# Patient Record
Sex: Male | Born: 1960 | Race: Black or African American | Hispanic: No | Marital: Single | Smoking: Current some day smoker
Health system: Southern US, Community
[De-identification: ages and names within clinical notes are randomized; demographics above are authoritative.]

## PROBLEM LIST (undated history)

## (undated) DIAGNOSIS — M779 Enthesopathy, unspecified: Secondary | ICD-10-CM

## (undated) DIAGNOSIS — K7460 Unspecified cirrhosis of liver: Secondary | ICD-10-CM

## (undated) DIAGNOSIS — B1920 Unspecified viral hepatitis C without hepatic coma: Secondary | ICD-10-CM

## (undated) DIAGNOSIS — F4310 Post-traumatic stress disorder, unspecified: Secondary | ICD-10-CM

## (undated) DIAGNOSIS — I10 Essential (primary) hypertension: Secondary | ICD-10-CM

## (undated) HISTORY — PX: HAND SURGERY: SHX662

---

## 1997-12-30 ENCOUNTER — Inpatient Hospital Stay (HOSPITAL_COMMUNITY): Admission: EM | Admit: 1997-12-30 | Discharge: 1997-12-31 | Payer: Self-pay

## 1999-01-20 ENCOUNTER — Emergency Department (HOSPITAL_COMMUNITY): Admission: EM | Admit: 1999-01-20 | Discharge: 1999-01-20 | Payer: Self-pay | Admitting: Emergency Medicine

## 1999-06-27 ENCOUNTER — Emergency Department (HOSPITAL_COMMUNITY): Admission: EM | Admit: 1999-06-27 | Discharge: 1999-06-27 | Payer: Self-pay | Admitting: Emergency Medicine

## 2001-12-17 ENCOUNTER — Emergency Department (HOSPITAL_COMMUNITY): Admission: EM | Admit: 2001-12-17 | Discharge: 2001-12-17 | Payer: Self-pay | Admitting: Emergency Medicine

## 2002-11-13 ENCOUNTER — Ambulatory Visit (HOSPITAL_COMMUNITY): Admission: RE | Admit: 2002-11-13 | Discharge: 2002-11-13 | Payer: Self-pay | Admitting: Gastroenterology

## 2003-08-22 ENCOUNTER — Inpatient Hospital Stay (HOSPITAL_COMMUNITY): Admission: AD | Admit: 2003-08-22 | Discharge: 2003-08-24 | Payer: Self-pay | Admitting: Emergency Medicine

## 2010-02-24 ENCOUNTER — Emergency Department (HOSPITAL_COMMUNITY): Admission: EM | Admit: 2010-02-24 | Discharge: 2010-02-24 | Payer: Self-pay | Admitting: Emergency Medicine
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
## 2010-09-29 NOTE — Op Note (Signed)
   NAME:  Angel Golden, Angel Golden                            ACCOUNT NO.:  [id number]   MEDICAL RECORD NO.:  [id number]                   PATIENT TYPE:  AMB   LOCATION:  ENDO                                 FACILITY:  [facility]   PHYSICIAN:  Anselmo Rod, M.D.               DATE OF BIRTH:  02-09-1961   DATE OF PROCEDURE:  11/13/2002  DATE OF DISCHARGE:                                 OPERATIVE REPORT   PROCEDURE PERFORMED:  Colonoscopy.   ENDOSCOPIST:  Anselmo Rod, M.D.   INSTRUMENT USED:  Olympus videocolonoscope.   INDICATION FOR THE PROCEDURE:  A 50 year old African-American male with  history of hepatitis B and C undergoing colonoscopy for history of GI bleed.  Rule out colonic polyps, masses, etc.   PREPROCEDURE PREPARATION:  Informed consent was procured from the patient.  The patient fasted for eight hours prior to the procedure and prepped with a  bottle of magnesium citrate and a gallon of GoLYTELY the night prior to the  procedure.   PREPROCEDURE PHYSICAL:  VITAL SIGNS:  Stable.  NECK:  Supple.  CHEST:  Clear to auscultation.  S1 and S2 regular.  ABDOMEN:  Soft with normal bowel sounds.   DESCRIPTION OF THE PROCEDURE:  The patient was placed in the left lateral  decubitus position and sedated with 80 mg of Demerol and  8 mg of Versed  intravenously.  Once the patient was adequately sedated and maintained on  low-flow oxygen and continuous cardiac monitoring, the Olympus  videocolonoscope was advanced from the rectum to the cecum without  difficulty.  No masses, polyps, erosions, ulcerations, or diverticula were  seen.  The appendiceal orifice and ileocecal valve were clearly visualized  and photographed.  Small internal hemorrhoids were seen on retroflexion in  the rectum.  The patient tolerated the procedure well without complications.   IMPRESSION:  Normal colonoscopy to the terminal ileum except for small  internal hemorrhoids.   RECOMMENDATIONS:  1. High fiber diet  with liberal fluid intake has been advised.  2.     Anusol HC 2.5% suppositories have been advised, one p.r. q.h.s.  These have      been called into his local pharmacy.  3. Outpatient followup in the next four weeks for further recommendations.                                               Anselmo Rod, M.D.    JNM/MEDQ  D:  11/13/2002  T:  11/14/2002  Job:  [id number]   cc:   Gabriel Earing, M.D.  [address]  [place]  [place] [address]  Fax: [phone]

## 2013-02-24 ENCOUNTER — Encounter (HOSPITAL_COMMUNITY): Payer: Self-pay | Admitting: Emergency Medicine

## 2013-02-24 ENCOUNTER — Emergency Department (HOSPITAL_COMMUNITY)
Admission: EM | Admit: 2013-02-24 | Discharge: 2013-02-26 | Disposition: A | Discharge status: Other Acute Inpatient Hospital | Payer: Self-pay | Attending: Emergency Medicine | Admitting: Emergency Medicine

## 2013-02-24 DIAGNOSIS — Z5900 Homelessness unspecified: Secondary | ICD-10-CM | POA: Insufficient documentation

## 2013-02-24 DIAGNOSIS — R45850 Homicidal ideations: Secondary | ICD-10-CM | POA: Insufficient documentation

## 2013-02-24 DIAGNOSIS — F17200 Nicotine dependence, unspecified, uncomplicated: Secondary | ICD-10-CM | POA: Insufficient documentation

## 2013-02-24 DIAGNOSIS — R45851 Suicidal ideations: Secondary | ICD-10-CM | POA: Insufficient documentation

## 2013-02-24 DIAGNOSIS — F1010 Alcohol abuse, uncomplicated: Secondary | ICD-10-CM | POA: Insufficient documentation

## 2013-02-24 DIAGNOSIS — Z8719 Personal history of other diseases of the digestive system: Secondary | ICD-10-CM | POA: Insufficient documentation

## 2013-02-24 DIAGNOSIS — F1410 Cocaine abuse, uncomplicated: Secondary | ICD-10-CM | POA: Insufficient documentation

## 2013-02-24 DIAGNOSIS — I10 Essential (primary) hypertension: Secondary | ICD-10-CM | POA: Insufficient documentation

## 2013-02-24 DIAGNOSIS — F1910 Other psychoactive substance abuse, uncomplicated: Secondary | ICD-10-CM

## 2013-02-24 DIAGNOSIS — F1210 Cannabis abuse, uncomplicated: Secondary | ICD-10-CM | POA: Insufficient documentation

## 2013-02-24 DIAGNOSIS — Z8619 Personal history of other infectious and parasitic diseases: Secondary | ICD-10-CM | POA: Insufficient documentation

## 2013-02-24 HISTORY — DX: Unspecified viral hepatitis C without hepatic coma: B19.20

## 2013-02-24 HISTORY — DX: Unspecified cirrhosis of liver: K74.60

## 2013-02-24 LAB — CBC
HCT: 42.1 % (ref 39.0–52.0)
Hemoglobin: 14.4 g/dL (ref 13.0–17.0)
Platelets: 128 10*3/uL — ABNORMAL LOW (ref 150–400)
RBC: 4.79 MIL/uL (ref 4.22–5.81)
RDW: 14.4 % (ref 11.5–15.5)
WBC: 5.1 10*3/uL (ref 4.0–10.5)

## 2013-02-24 LAB — RAPID URINE DRUG SCREEN, HOSP PERFORMED
Barbiturates: NOT DETECTED
Benzodiazepines: NOT DETECTED
Opiates: NOT DETECTED
Tetrahydrocannabinol: POSITIVE — AB

## 2013-02-24 LAB — COMPREHENSIVE METABOLIC PANEL
AST: 327 U/L — ABNORMAL HIGH (ref 0–37)
Albumin: 4 g/dL (ref 3.5–5.2)
Alkaline Phosphatase: 116 U/L (ref 39–117)
BUN: 6 mg/dL (ref 6–23)
CO2: 28 mEq/L (ref 19–32)
GFR calc Af Amer: >90 mL/min (ref >90)
Glucose, Bld: 72 mg/dL (ref 70–99)
Sodium: 136 mEq/L (ref 135–145)

## 2013-02-24 LAB — SALICYLATE LEVEL: Salicylate Lvl: <2 mg/dL — ABNORMAL LOW (ref 2.8–20.0)

## 2013-02-24 MED ORDER — NICOTINE 21 MG/24HR TD PT24
21.0000 mg | MEDICATED_PATCH | Freq: Every day | TRANSDERMAL | Status: DC
  Administered 2013-02-24 – 2013-02-26 (×3): 21 mg via TRANSDERMAL
  Filled 2013-02-24 (×3): qty 1

## 2013-02-24 MED ORDER — ACETAMINOPHEN 325 MG PO TABS
650.0000 mg | ORAL_TABLET | ORAL | Status: DC | PRN
  Administered 2013-02-25 (×2): 650 mg via ORAL
  Filled 2013-02-24 (×2): qty 2

## 2013-02-24 MED ORDER — LORAZEPAM 1 MG PO TABS
0.0000 mg | ORAL_TABLET | Freq: Four times a day (QID) | ORAL | Status: AC
  Administered 2013-02-26: 1 mg via ORAL
  Filled 2013-02-24 (×2): qty 1

## 2013-02-24 MED ORDER — ADULT MULTIVITAMIN W/MINERALS CH
1.0000 | ORAL_TABLET | Freq: Every day | ORAL | Status: DC
  Administered 2013-02-24 – 2013-02-26 (×3): 1 via ORAL
  Filled 2013-02-24 (×3): qty 1

## 2013-02-24 MED ORDER — LORAZEPAM 1 MG PO TABS: 0.0000 mg | ORAL_TABLET | Freq: Two times a day (BID) | ORAL | Status: DC

## 2013-02-24 MED ORDER — ZOLPIDEM TARTRATE 5 MG PO TABS: 5.0000 mg | ORAL_TABLET | Freq: Every evening | ORAL | Status: DC | PRN

## 2013-02-24 MED ORDER — IBUPROFEN 400 MG PO TABS
600.0000 mg | ORAL_TABLET | Freq: Three times a day (TID) | ORAL | Status: DC | PRN
  Administered 2013-02-24 – 2013-02-26 (×2): 600 mg via ORAL
  Filled 2013-02-24 (×4): qty 1

## 2013-02-24 MED ORDER — ONDANSETRON HCL 4 MG PO TABS
4.0000 mg | ORAL_TABLET | Freq: Three times a day (TID) | ORAL | Status: DC | PRN
Start: 2013-02-24 — End: 2013-02-26

## 2013-02-24 MED ORDER — ALUM & MAG HYDROXIDE-SIMETH 200-200-20 MG/5ML PO SUSP: 30.0000 mL | ORAL | Status: DC | PRN

## 2013-02-24 MED ORDER — LORAZEPAM 2 MG/ML IJ SOLN: 0.0000 mg | Freq: Four times a day (QID) | INTRAMUSCULAR | Status: DC

## 2013-02-24 MED ORDER — FOLIC ACID 1 MG PO TABS
1.0000 mg | ORAL_TABLET | Freq: Every day | ORAL | Status: DC
  Administered 2013-02-24 – 2013-02-26 (×3): 1 mg via ORAL
  Filled 2013-02-24 (×3): qty 1

## 2013-02-24 MED ORDER — LORAZEPAM 1 MG PO TABS
1.0000 mg | ORAL_TABLET | Freq: Three times a day (TID) | ORAL | Status: DC | PRN
  Administered 2013-02-25: 1 mg via ORAL

## 2013-02-24 MED ORDER — THIAMINE HCL 100 MG/ML IJ SOLN: 100.0000 mg | Freq: Every day | INTRAMUSCULAR | Status: DC

## 2013-02-24 MED ORDER — LORAZEPAM 2 MG/ML IJ SOLN: 0.0000 mg | Freq: Two times a day (BID) | INTRAMUSCULAR | Status: DC

## 2013-02-24 MED ORDER — VITAMIN B-1 100 MG PO TABS
100.0000 mg | ORAL_TABLET | Freq: Every day | ORAL | Status: DC
  Administered 2013-02-24 – 2013-02-26 (×3): 100 mg via ORAL
  Filled 2013-02-24 (×3): qty 1

## 2013-02-24 NOTE — ED Notes (Signed)
Telepsych placed at bedside. Spoke with psychiatrist on phone. Psychiatrist to call now.

## 2013-02-24 NOTE — ED Notes (Signed)
Pt reporting SI and HI now due to relationship and family stress. Reports no family support. Reports plan to run truck off bridge to kill himself. Denies hearing voices or hallucinations. Reports I am tired. Pt is sitting calmly in chair and is cooperative. Reports alcohol, crack and weed use. States he is homeless and has been since May. C/o headache which he keeps all the time.

## 2013-02-24 NOTE — ED Notes (Signed)
Confirmed with security, pt has been wanded.

## 2013-02-24 NOTE — Progress Notes (Signed)
TTS ran pt by Angel Golden and she agreed pt need inpatient treatment.  Attempts will be made to get pt accepted to [place] since pt has already started this process and prefers to go there.  Pt would be appropriate for a 500 or 300 hall at [facility] - but no beds at this time.

## 2013-02-24 NOTE — ED Notes (Signed)
Called AC for sitter 
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
## 2013-02-24 NOTE — ED Provider Notes (Signed)
CSN: [id number]     Arrival date & time 02/24/13  1334 History   First MD Initiated Contact with Patient 02/24/13 1538     Chief Complaint  Patient presents with  . Suicidal   (Consider location/radiation/quality/duration/timing/severity/associated sxs/prior Treatment) HPI Comments: Patient is a 52 year old male with history of cirrhosis and hepatitis C who presents today with dysphoric mood, worsening suicidal and homicidal thoughts. He reports these have been worsening since Easter when his girlfriend broke up with him. Since that time he has been drinking as much beer as he can, smoking crack cocaine, and using marijuana. He reports he drinks approximately 6 40 ounces of beer daily. He has history of DTs after detoxing while in prison. His last drink was around 230 am. He denies visual or auditory hallucinations. He has no physical complaints.   The history is provided by the patient. No language interpreter was used.    Past Medical History  Diagnosis Date  . Cirrhosis   . Hepatitis C    History reviewed. No pertinent past surgical history. No family history on file. History  Substance Use Topics  . Smoking status: Current Every Day Smoker  . Smokeless tobacco: Not on file  . Alcohol Use: Yes    Review of Systems  Constitutional: Negative for fever and chills.  Respiratory: Negative for shortness of breath.   Cardiovascular: Negative for chest pain.  Gastrointestinal: Negative for nausea, vomiting and abdominal pain.  Psychiatric/Behavioral: Positive for suicidal ideas and dysphoric mood. Negative for hallucinations.  All other systems reviewed and are negative.    Allergies  Review of patient's allergies indicates no known allergies.  Home Medications  No current outpatient prescriptions on file. BP 147/102  Pulse 89  Temp(Src) 99 F (37.2 C) (Oral)  Resp 16  SpO2 97% Physical Exam  Nursing note and vitals reviewed. Constitutional: He is oriented to person,  place, and time. He appears well-developed and well-nourished. No distress.  HENT:  Head: Normocephalic and atraumatic.  Right Ear: External ear normal.  Left Ear: External ear normal.  Nose: Nose normal.  Eyes: Conjunctivae are normal.  Neck: Normal range of motion. No tracheal deviation present.  Cardiovascular: Normal rate, regular rhythm and normal heart sounds.   Pulmonary/Chest: Effort normal and breath sounds normal. No stridor.  Abdominal: Soft. He exhibits no distension. There is no tenderness.  Musculoskeletal: Normal range of motion.  Neurological: He is alert and oriented to person, place, and time.  Skin: Skin is warm and dry. He is not diaphoretic.  Psychiatric: He has a normal mood and affect. His behavior is normal. He expresses homicidal and suicidal ideation. He expresses no suicidal plans and no homicidal plans.    ED Course  Procedures (including critical care time) Labs Review Labs Reviewed  CBC - Abnormal; Notable for the following:    Platelets 128 (*)    All other components within normal limits  COMPREHENSIVE METABOLIC PANEL - Abnormal; Notable for the following:    Total Protein 8.9 (*)    AST 327 (*)    ALT 187 (*)    Total Bilirubin 1.8 (*)    All other components within normal limits  SALICYLATE LEVEL - Abnormal; Notable for the following:    Salicylate Lvl <2.0 (*)    All other components within normal limits  URINE RAPID DRUG SCREEN (HOSP PERFORMED) - Abnormal; Notable for the following:    Cocaine POSITIVE (*)    Tetrahydrocannabinol POSITIVE (*)    All  other components within normal limits  ACETAMINOPHEN LEVEL  ETHANOL   Imaging Review No results found.  EKG Interpretation   None       MDM   1. Suicidal ideation   2. Homicidal ideation   3. Alcohol abuse   4. Substance abuse    Patient presents today with SI/HI and depression. Hx of substance and alcohol abuse. Hx of hep c and cirrhosis. Patient to be seen by ACT team. LFTs  elevated. I suspect this is chronic. Medically clear. Placed on CIWA protocol. Pt is cooperative.     Mora Bellman, PA-C 02/24/13 1754

## 2013-02-25 ENCOUNTER — Encounter (HOSPITAL_COMMUNITY): Payer: Self-pay | Admitting: Emergency Medicine

## 2013-02-25 NOTE — ED Notes (Signed)
When asked pt if he is homeless -- pt answered "not really- I have been staying with friends" but admits to not having a permanent residence. Pt given information regarding [facility] in [place] for homeless veterans.

## 2013-02-25 NOTE — ED Notes (Addendum)
Spoke to April RN, Patient transfer coordinator [phone] sts has taken place of Andi Hence. sts deals with all transfers and has put patient on the waiting list but unable to give timeframe. Sts does not know who "Rice" is but she is the contact for all patients from this system to get placement in the [place].  Pt updated on plan of care. Attempted to reach pt. [facility] case manager and TTS with no success.

## 2013-02-25 NOTE — ED Notes (Addendum)
Pt spoke on phone to [employer] from [place].  Pt. Updated on plan of care.

## 2013-02-25 NOTE — Progress Notes (Signed)
Underwriter called [facility] [place] who has beds.  Spoke with Rice who confirmed receiving faxed referral for pt.  Blain Pais, MHT/NS

## 2013-02-25 NOTE — ED Notes (Signed)
Pt reports pain in his head, but does not want any medication for the pain.

## 2013-02-25 NOTE — BH Assessment (Signed)
Patient's case manager with the [facility] is Madelaine Bhat Roche [phone].

## 2013-02-25 NOTE — ED Notes (Addendum)
Rn called and spoke to [place] with tts and received update. RN to call   [facility]i [facility] with [facility] to aid in transfer   [phone] ext. [phone].  (fax) [phone]

## 2013-02-25 NOTE — ED Notes (Signed)
Spoke with Angel Golden- TTS and updated on [place] situation.

## 2013-02-25 NOTE — ED Provider Notes (Signed)
Medical screening examination/treatment/procedure(s) were performed by non-physician practitioner and as supervising physician I was immediately available for consultation/collaboration.   Shanette Tamargo M Xayne Brumbaugh, MD 02/25/13 0053 

## 2013-02-26 ENCOUNTER — Inpatient Hospital Stay (HOSPITAL_COMMUNITY)
Admission: AD | Admit: 2013-02-26 | Discharge: 2013-03-05 | DRG: 897 | Disposition: A | Discharge status: Home / Self Care | Payer: Federal, State, Local not specified - Other | Source: Intra-hospital | Attending: Psychiatry | Admitting: Psychiatry

## 2013-02-26 ENCOUNTER — Inpatient Hospital Stay (HOSPITAL_COMMUNITY): Admission: AD | Admit: 2013-02-26 | Payer: Self-pay | Source: Intra-hospital | Admitting: Psychiatry

## 2013-02-26 ENCOUNTER — Encounter (HOSPITAL_COMMUNITY): Payer: Self-pay

## 2013-02-26 DIAGNOSIS — B1920 Unspecified viral hepatitis C without hepatic coma: Secondary | ICD-10-CM | POA: Diagnosis present

## 2013-02-26 DIAGNOSIS — Z79899 Other long term (current) drug therapy: Secondary | ICD-10-CM

## 2013-02-26 DIAGNOSIS — F4310 Post-traumatic stress disorder, unspecified: Secondary | ICD-10-CM | POA: Diagnosis present

## 2013-02-26 DIAGNOSIS — K7460 Unspecified cirrhosis of liver: Secondary | ICD-10-CM | POA: Diagnosis present

## 2013-02-26 DIAGNOSIS — F39 Unspecified mood [affective] disorder: Secondary | ICD-10-CM | POA: Diagnosis present

## 2013-02-26 DIAGNOSIS — F1920 Other psychoactive substance dependence, uncomplicated: Principal | ICD-10-CM | POA: Diagnosis present

## 2013-02-26 DIAGNOSIS — I10 Essential (primary) hypertension: Secondary | ICD-10-CM | POA: Diagnosis present

## 2013-02-26 HISTORY — DX: Essential (primary) hypertension: I10

## 2013-02-26 MED ORDER — MAGNESIUM HYDROXIDE 400 MG/5ML PO SUSP: 30.0000 mL | Freq: Every day | ORAL | Status: DC | PRN

## 2013-02-26 MED ORDER — CHLORDIAZEPOXIDE HCL 25 MG PO CAPS
25.0000 mg | ORAL_CAPSULE | ORAL | Status: AC
  Administered 2013-03-02 (×2): 25 mg via ORAL
  Filled 2013-02-26 (×2): qty 1

## 2013-02-26 MED ORDER — ALUM & MAG HYDROXIDE-SIMETH 200-200-20 MG/5ML PO SUSP
30.0000 mL | ORAL | Status: DC | PRN
  Administered 2013-02-28: 30 mL via ORAL

## 2013-02-26 MED ORDER — ONDANSETRON 4 MG PO TBDP: 4.0000 mg | ORAL_TABLET | Freq: Four times a day (QID) | ORAL | Status: AC | PRN

## 2013-02-26 MED ORDER — CHLORDIAZEPOXIDE HCL 25 MG PO CAPS
25.0000 mg | ORAL_CAPSULE | Freq: Three times a day (TID) | ORAL | Status: AC
  Administered 2013-03-01 (×3): 25 mg via ORAL
  Filled 2013-02-26 (×3): qty 1

## 2013-02-26 MED ORDER — HYDROXYZINE HCL 25 MG PO TABS
25.0000 mg | ORAL_TABLET | Freq: Four times a day (QID) | ORAL | Status: AC | PRN
  Administered 2013-02-26: 25 mg via ORAL
  Filled 2013-02-26: qty 1

## 2013-02-26 MED ORDER — CHLORDIAZEPOXIDE HCL 25 MG PO CAPS
25.0000 mg | ORAL_CAPSULE | Freq: Every day | ORAL | Status: AC
  Administered 2013-03-03: 25 mg via ORAL
  Filled 2013-02-26: qty 1

## 2013-02-26 MED ORDER — LOPERAMIDE HCL 2 MG PO CAPS: 2.0000 mg | ORAL_CAPSULE | ORAL | Status: AC | PRN

## 2013-02-26 MED ORDER — CHLORDIAZEPOXIDE HCL 25 MG PO CAPS
25.0000 mg | ORAL_CAPSULE | Freq: Three times a day (TID) | ORAL | Status: AC
  Administered 2013-02-27 – 2013-02-28 (×6): 25 mg via ORAL
  Filled 2013-02-26 (×6): qty 1

## 2013-02-26 MED ORDER — CHLORDIAZEPOXIDE HCL 25 MG PO CAPS: 25.0000 mg | ORAL_CAPSULE | Freq: Four times a day (QID) | ORAL | Status: AC | PRN

## 2013-02-26 MED ORDER — VITAMIN B-1 100 MG PO TABS
100.0000 mg | ORAL_TABLET | Freq: Every day | ORAL | Status: DC
  Administered 2013-02-27 – 2013-03-05 (×7): 100 mg via ORAL
  Filled 2013-02-26 (×8): qty 1

## 2013-02-26 MED ORDER — ADULT MULTIVITAMIN W/MINERALS CH
1.0000 | ORAL_TABLET | Freq: Every day | ORAL | Status: DC
  Administered 2013-02-27 – 2013-03-05 (×7): 1 via ORAL
  Filled 2013-02-26 (×8): qty 1

## 2013-02-26 MED ORDER — TRAZODONE HCL 50 MG PO TABS
50.0000 mg | ORAL_TABLET | Freq: Every evening | ORAL | Status: DC | PRN
  Administered 2013-02-26 – 2013-03-04 (×5): 50 mg via ORAL
  Filled 2013-02-26 (×2): qty 1
  Filled 2013-02-26: qty 28
  Filled 2013-02-26 (×3): qty 1

## 2013-02-26 MED ORDER — ACETAMINOPHEN 325 MG PO TABS
650.0000 mg | ORAL_TABLET | Freq: Four times a day (QID) | ORAL | Status: DC | PRN
  Administered 2013-02-27 – 2013-03-03 (×2): 650 mg via ORAL
  Filled 2013-02-26 (×2): qty 2

## 2013-02-26 NOTE — ED Notes (Signed)
Pt. Resting quietly then noted to be punching at the air rising up quickly to a sitting position. When asked by sitter and RN he states he suffers from nightmares and PTSD. Pt. Able to lay back down and rest after episode.

## 2013-02-26 NOTE — Progress Notes (Signed)
Responded to page to provide spiritual and emotional  support to patient. Patient indicated that he was having challenges with family, employment, housing,and personal relationship with a friend. Patient expressed that he is overwhelmed and frustrated because things are not falling into place as he would like. Patient was very reflective on a variety of other subjects that he is passionate about and is hoping for a positive outcome. I supported patient in his search for meaning, change and hope. I help patient to reinforced his faith and to rethink some of his choices. I provided empathic listening, counseling, prayer encouragement,  ministry of presence and offered blessing upon him. Will follow as needed.  02/26/13 1000  Clinical Encounter Type  Visited With Patient;Health care provider  Visit Type Psychological support;Spiritual support  Referral From Nurse  Spiritual Encounters  Spiritual Needs Prayer;Emotional  Stress Factors  Patient Stress Factors Family relationships;Financial concerns;Major life changes  ........Marland KitchenWat[place], Vicci Reder, [address]

## 2013-02-26 NOTE — ED Notes (Signed)
PASTOR RAY CONTINUES WITH PT.

## 2013-02-26 NOTE — BH Assessment (Signed)
Called the [facility] transfer coordinator April [phone]) and left a voicemail. Then called Joey another transfer coordinator [phone]), he verified that patient is still on the wait list (9th pt. on list).  Per Joey, no more than 3 anticipated discharges today. Patient will more than likely not be transferred today. TS staff will continue to follow up on this patients placement in the am. Pt to remain in the ED until his bed with the [facility] becomes available or psychiatry recommends discharge home.

## 2013-02-26 NOTE — BH Assessment (Signed)
TC from [employer] - SW supervisor at [facility] - Madelaine Bhat says he isn't pt's case manager [phone]. He says pt hasn't been engaged in any ongoing therapy and pt not with a certain case Production designer, theatre/television/film. Adam reports pt has been coming in for medical appointments at [facility] and pt missed two initial appointments w/ [facility] in past two months. Adam states in the future it is best if TTS calls transfer coordinator first to initiate referrals rather than calling him.   Evette Cristal, [place] Assessment Counselor

## 2013-02-26 NOTE — ED Provider Notes (Signed)
Pt stable and currently awaiting placement at [facility] hospital  BP 138/87  Pulse 75  Temp(Src) 98.1 F (36.7 C) (Oral)  Resp 18  SpO2 97%   Joya Gaskins, MD 02/26/13 [phone]

## 2013-02-26 NOTE — ED Notes (Signed)
Pt. Was resting quietly when he was started punching the air and thrashing with his eyes closed for less than a minute. He then sat straight up and after a minute was ok. He states he has a Hx. Of PTSD.
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
## 2013-02-26 NOTE — ED Notes (Signed)
Spoke with "joey" at the va. [phone] . He advises that the paient is currently 9th on the list. The are anticipating 3 beds to become available today so pt transfer today is not likely. Joey advises he will call if a bed does open up.  He also advises that "april" also works with transfers at x [phone]
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
## 2013-02-26 NOTE — Tx Team (Signed)
Initial Interdisciplinary Treatment Plan  PATIENT STRENGTHS: (choose at least two) Ability for insight Average or above average intelligence Communication skills General fund of knowledge  PATIENT STRESSORS: Legal issue Substance abuse   PROBLEM LIST: Problem List/Patient Goals Date to be addressed Date deferred Reason deferred Estimated date of resolution  Depression 02/26/13     Suicide 02/26/13     Substance Abuse 02/26/13                                          DISCHARGE CRITERIA:  Improved stabilization in mood, thinking, and/or behavior  PRELIMINARY DISCHARGE PLAN: Outpatient therapy  PATIENT/FAMIILY INVOLVEMENT: This treatment plan has been presented to and reviewed with the patient, Angel Golden, and/or family member.  The patient and family have been given the opportunity to ask questions and make suggestions.  Gretta Arab [facility] 02/26/2013, 9:37 PM
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
## 2013-02-26 NOTE — ED Notes (Signed)
Ray with pastoral care in to visit/counsel patient

## 2013-02-26 NOTE — ED Notes (Signed)
Report given to [facility] floor nurse. Valuables retrieved from security along with bag of other personals, both given to [facility] transportation. Copy of valuables form and personal belongings bag placed in medical record. Pt pleasant and smiling, calm and cooperative about going to [facility] although he states he would like to eventually go to the [place].

## 2013-02-26 NOTE — ED Notes (Signed)
Called va  [phone] ext [phone] (transfer coordinator) and left message. To check on pt transfer status

## 2013-02-27 ENCOUNTER — Encounter (HOSPITAL_COMMUNITY): Payer: Self-pay | Admitting: Psychiatry

## 2013-02-27 DIAGNOSIS — F322 Major depressive disorder, single episode, severe without psychotic features: Secondary | ICD-10-CM

## 2013-02-27 DIAGNOSIS — F1994 Other psychoactive substance use, unspecified with psychoactive substance-induced mood disorder: Secondary | ICD-10-CM

## 2013-02-27 DIAGNOSIS — F39 Unspecified mood [affective] disorder: Secondary | ICD-10-CM | POA: Diagnosis present

## 2013-02-27 DIAGNOSIS — F4310 Post-traumatic stress disorder, unspecified: Secondary | ICD-10-CM | POA: Diagnosis present

## 2013-02-27 DIAGNOSIS — F1220 Cannabis dependence, uncomplicated: Secondary | ICD-10-CM

## 2013-02-27 DIAGNOSIS — F1020 Alcohol dependence, uncomplicated: Secondary | ICD-10-CM

## 2013-02-27 DIAGNOSIS — F1410 Cocaine abuse, uncomplicated: Secondary | ICD-10-CM

## 2013-02-27 DIAGNOSIS — F1920 Other psychoactive substance dependence, uncomplicated: Secondary | ICD-10-CM | POA: Diagnosis present

## 2013-02-27 NOTE — [facility] Group Notes (Signed)
[facility] LCSW Group Therapy  02/27/2013 3:22 PM  Type of Therapy:  Group Therapy  Participation Level:  None  Participation Quality:  Inattentive  Affect:  Blunted and Flat  Cognitive:  Lacking  Insight:  Limited  Engagement in Therapy:  Limited  Modes of Intervention:  Confrontation, Discussion, Education, Exploration, Socialization and Support  Summary of Progress/Problems: Feelings around Relapse. Group members discussed the meaning of relapse and shared personal stories of relapse, how it affected them and others, and how they perceived themselves during this time. Group members were encouraged to identify triggers, warning signs and coping skills used when facing the possibility of relapse. Social supports were discussed and explored in detail. Angel Golden was inattentive and disengaged throughout today's therapy group. He presented with irritated mood and blunted affect. La shows no progress in the group setting at this time AEB his resistance to active participation and limited attention to group members and facilitator during group.   Angel Golden, Angel Golden 02/27/2013, 3:22 PM

## 2013-02-27 NOTE — BHH Group Notes (Signed)
[facility] Group Notes:  (Nursing/MHT/Case Management/Adjunct)  Date:  02/27/2013  Time:  11:50 AM  Type of Therapy:  Psychoeducational Skills  Participation Level:  Minimal  Participation Quality:  Appropriate and Attentive  Affect:  Appropriate  Cognitive:  Appropriate  Insight:  Appropriate  Engagement in Group:  Limited  Modes of Intervention:  Discussion, Education, Exploration, Socialization and Support  Summary of Progress/Problems: Pt attended group but was not active during the group discussion. Pt stated, "At this point I don't want anybody to know anything about me."  Tania Ade 02/27/2013, 11:50 AM
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
## 2013-02-27 NOTE — Progress Notes (Signed)
Patient ID: Angel Golden, male   DOB: [birth year], 53 y.o.   MRN: [id number] D: Patient in dayroom on approach. Pt mood/affect depressed and flat. Pt stated he was able to talk to Dr. Dub Mikes about going back to the [place]. Pt stated he is not upset anymore but feels like [facility]  [facility] his [facility]. Pt endorses passive suicidal ideation but contracts for safety. Pt denies HI/AVH and pain. Pt attended evening AA group and stated he does feel comfortable sharing in group. Pt denies any needs or concerns.  Cooperative with assessment. No acute distressed noted at this time.   A: Met with pt 1:1. Medications administered as prescribed. Writer encouraged pt to discuss feelings. Pt encouraged to come to staff with any question or concerns.   R: Patient remains safe. He is complaint with medications and denies any adverse reaction. Continue current POC.

## 2013-02-27 NOTE — Progress Notes (Signed)
Patient ID: Angel Golden, male   DOB: 1960/09/30, 52 y.o.   MRN: [id number] Pt did not participate in 11am Relapse Recovery group. He was in bed asleep.

## 2013-02-27 NOTE — Progress Notes (Signed)
D:  Per pt self inventory pt reports sleeping with medication, appetite improving, energy level low, ability to pay attention improving, rates depression at a 6 out of 10 and hopelessness at a 7 out of 10, endorsing SI on and off, verbal contract for safety, denies HI/AVH.     A:  Emotional support provided, Encouraged pt to continue with treatment plan and attend all group activities, q15 min checks maintained for safety.  R:  Pt is focused on going to the [place], states that "I need to talk to someone about this PTSD, I need to go to the [place], I don't think you all can help me here."  Pt states that he has issues getting his meds due to not being able to afford them at times.  Pt is attending groups but not participating/sharing at this time.

## 2013-02-27 NOTE — Tx Team (Signed)
Interdisciplinary Treatment Plan Update (Adult)  Date: 02/27/2013    Time Reviewed: 10:35 AM  Progress in Treatment:  Attending groups: Yes  Participating in groups:  Minimally  Taking medication as prescribed: Yes  Tolerating medication: Yes  Family/Significant othe contact made: Not yet. SPE required for this pt.   Patient understands diagnosis: Yes, AEB seeking treatment for SI/HI, depression, ETOH detox, and mood stabilization. Discussing patient identified problems/goals with staff: Yes  Medical problems stabilized or resolved: Yes  Denies suicidal/homicidal ideation: Passive SI, able to contract for safety.  Patient has not harmed self or Others: Yes  New problem(s) identified: Labile/irritable mood and resistant to speaking in group/individual session.  Discharge Plan or Barriers: Pt resistant to talking about d/c plan in group. Pt stated that he does not understand why he is at [facility] rather than the [facility]. Pt stated that he is on waiting list currently but was unwilling to share any other information at this point.  Additional comments: Pt presents in ED with homicidal and suicidal thoughts. Pt plan is to jump off bridge, walk into traffic or, "if I had a gun, which I don't, I would use that." Pt denies prior hx of suicidal behaviors but reports "I have thought a lot about it and I'm just tired of living." Pt reports "There are a couple of guys that I could kill. If I weren't in here and I came across them, I would try." When asked again about weapons pt responded "Well, I could get something." Pt denies AVH. Could not attempt to warn those pt wants to harm due to pt not disclosing who they are. Pt also uses THC 3 to 4x per week and crack 2x per month. Amounts vary based on access and money. Pt consumes about 240 oz (6 40s) per day and has done so since 2000. Pt has never had detox or been in rehab. Pt was in prison for 7 years and was released in 2000 for a non-violent offense. Pt  currently lives in his truck and stays with friends when he can. Pt has had relationship issues with friends and his girlfriend beginning back in [place] of 2014 and it has continued to worsen. Pt reports "I just can't take it." Pt identifies no one in his life offering support. Pt served previously in the [employer] from 79-81 and is active with the [place]. [facility] [place] [facility] to Darrold Junker at [phone] ext [phone]. Pt knew number by memory. Pt reports "Mardelle Matte told me to come to the ER because of my homicidal thinking and I would either get help here or be transferred somehow to the [place]. You can call him. That's where I'm trying to get to. But I will take any help along the way." Reason for Continuation of Hospitalization: Librium taper-withdrawals Mood stabilization SI Estimated length of stay: 3-5 days  For review of initial/current patient goals, please see plan of care.  Attendees:  Patient:    Family:    Physician: Geoffery Lyons MD 02/27/2013 10:35 AM   Nursing: Sue Lush RN  02/27/2013 10:35 AM   Clinical Social Worker Maisey Deandrade Smart, LCSWA  02/27/2013 10:35 AM   Other: Darden Dates Nurse CM  02/27/2013 10:35 AM   Other:    Other: Massie Kluver, Community Care Coordinator  02/27/2013 10:35 AM   Other:    Scribe for Treatment Team:  [employer] LCSWA 02/27/2013 10:35 AM

## 2013-02-27 NOTE — BHH Group Notes (Signed)
[facility] LCSW Aftercare Discharge Planning Group Note   02/27/2013 9:28 AM  Participation Quality:  Minimal   Mood/Affect:  Angry and Irritable  Depression Rating:  Refused to rate.   Anxiety Rating:  Refused to rate   Thoughts of Suicide:  No Will you contract for safety?   NA  Current AVH:  No  Plan for Discharge/Comments:  Pt refusing to talk in group. He stated that he wants to go to [facility] and does not know why he is here. Pt unwilling to discuss his problems further in the group setting. (According to admission note, pt is on waiting list for [facility] coordinator: April: [phone]).  Transportation Means: unknown at this time.   Supports: unknown at this time.   Smart, [employer]

## 2013-02-27 NOTE — BHH Suicide Risk Assessment (Signed)
Suicide Risk Assessment  Admission Assessment     Nursing information obtained from:  Patient Demographic factors:  Male;Low socioeconomic status;Living alone;Unemployed Current Mental Status:    Loss Factors:  Loss of significant relationship;Decline in physical health;Legal issues;Financial problems / change in socioeconomic status Historical Factors:    Risk Reduction Factors:     CLINICAL FACTORS:   Alcohol/Substance Abuse/Dependencies  COGNITIVE FEATURES THAT CONTRIBUTE TO RISK:  Closed-mindedness Polarized thinking Thought constriction (tunnel vision)    SUICIDE RISK:   Moderate:  Frequent suicidal ideation with limited intensity, and duration, some specificity in terms of plans, no associated intent, good self-control, limited dysphoria/symptomatology, some risk factors present, and identifiable protective factors, including available and accessible social support.  PLAN OF CARE: Supportive approach/coping skills/relapse prevention                               Detox with Librium                               Reassess and address the co morbities  I certify that inpatient services furnished can reasonably be expected to improve the patient's condition.  Amilia Vandenbrink A 02/27/2013, 5:02 PM

## 2013-02-27 NOTE — BHH Suicide Risk Assessment (Signed)
BHH INPATIENT:  Family/Significant Other Suicide Prevention Education  Suicide Prevention Education:  Patient Refusal for Family/Significant Other Suicide Prevention Education: The patient Angel Golden has refused to provide written consent for family/significant other to be provided Family/Significant Other Suicide Prevention Education during admission and/or prior to discharge.  Physician notified.   SPE completed with pt. SPI pamphlet provided to pt and he was encouraged to ask questions, talk about concerns, and share information with his support network.   Smart, Allesha Aronoff 02/27/2013, 4:06 PM

## 2013-02-28 DIAGNOSIS — F1920 Other psychoactive substance dependence, uncomplicated: Principal | ICD-10-CM

## 2013-02-28 DIAGNOSIS — F321 Major depressive disorder, single episode, moderate: Secondary | ICD-10-CM

## 2013-02-28 MED ORDER — NICOTINE 21 MG/24HR TD PT24
21.0000 mg | MEDICATED_PATCH | Freq: Every day | TRANSDERMAL | Status: DC
  Administered 2013-02-28: 08:00:00 via TRANSDERMAL
  Administered 2013-03-01 – 2013-03-05 (×5): 21 mg via TRANSDERMAL
  Filled 2013-02-28 (×6): qty 1

## 2013-02-28 MED ORDER — NICOTINE 21 MG/24HR TD PT24
MEDICATED_PATCH | TRANSDERMAL | Status: AC
  Filled 2013-02-28: qty 1

## 2013-02-28 NOTE — Clinical Social Work Note (Signed)
Clinical social work note  CSW provided patient with a pair of pants, a pair of shorts, a shirt, and a sweater.  He had no clothing with him, stated nobody was going to be bringing him any.  Ambrose Mantle, LCSW 02/28/2013, 5:19 PM

## 2013-02-28 NOTE — Progress Notes (Signed)
[facility] MD Progress Note  02/28/2013 5:40 PM CROIX PRESLEY  MRN:  [id number] Subjective:  Nora states that he has drastic mood swings even when he is not using. This has been going on for a while. Asked if he could have "bipolar." He is wanting to pursue placement in a [place] facility as states he needs long term treatment. Still endorsing anxiety, depression, mood instability Diagnosis:   DSM5: Schizophrenia Disorders:   Obsessive-Compulsive Disorders:   Trauma-Stressor Disorders:  Posttraumatic Stress Disorder (309.81) Substance/Addictive Disorders:  Polysubstance Dependence Depressive Disorders:  Major Depressive Disorder - Moderate (296.22)  Axis I: Mood Disorder NOS  ADL's:  Intact  Sleep: Fair  Appetite:  Fair  Suicidal Ideation:  Plan:  denies Intent:  denies Means:  denies Homicidal Ideation:  Plan:  denies Intent:  denies Means:  denies AEB (as evidenced by):  Psychiatric Specialty Exam: Review of Systems  Constitutional: Negative.   HENT: Negative.   Eyes: Negative.   Respiratory: Negative.   Cardiovascular: Negative.   Gastrointestinal: Negative.   Genitourinary: Negative.   Musculoskeletal: Negative.   Skin: Negative.   Neurological: Negative.   Endo/Heme/Allergies: Negative.   Psychiatric/Behavioral: Positive for depression, suicidal ideas and substance abuse. The patient is nervous/anxious.     Blood pressure 117/76, pulse 74, temperature 98.2 F (36.8 C), temperature source Oral, resp. rate 16, height 5' 6.5" (1.689 m), weight 77.565 kg (171 lb), SpO2 100.00%.Body mass index is 27.19 kg/(m^2).  General Appearance: Fairly Groomed  Patent attorney::  Fair  Speech:  Clear and Coherent  Volume:  Decreased  Mood:  Depressed  Affect:  Restricted  Thought Process:  Coherent and Goal Directed  Orientation:  Full (Time, Place, and Person)  Thought Content:  worries, concerns, symptoms  Suicidal Thoughts:  No  Homicidal Thoughts:  No  Memory:  Immediate;    Fair Recent;   Fair Remote;   Fair  Judgement:  Fair  Insight:  Present  Psychomotor Activity:  Restlessness  Concentration:  Fair  Recall:  Fair  Akathisia:  No  Handed:    AIMS (if indicated):     Assets:  Desire for Improvement  Sleep:  Number of Hours: 6   Current Medications: Current Facility-Administered Medications  Medication Dose Route Frequency Provider Last Rate Last Dose  . acetaminophen (TYLENOL) tablet 650 mg  650 mg Oral Q6H PRN Nelly Rout, MD   650 mg at 02/27/13 2212  . alum & mag hydroxide-simeth (MAALOX/MYLANTA) 200-200-20 MG/5ML suspension 30 mL  30 mL Oral Q4H PRN Nelly Rout, MD      . chlordiazePOXIDE (LIBRIUM) capsule 25 mg  25 mg Oral Q6H PRN Nelly Rout, MD      . Melene Muller ON 03/01/2013] chlordiazePOXIDE (LIBRIUM) capsule 25 mg  25 mg Oral TID Nelly Rout, MD       Followed by  . [START ON 03/02/2013] chlordiazePOXIDE (LIBRIUM) capsule 25 mg  25 mg Oral BH-qamhs Nelly Rout, MD       Followed by  . [START ON 03/03/2013] chlordiazePOXIDE (LIBRIUM) capsule 25 mg  25 mg Oral Daily Nelly Rout, MD      . hydrOXYzine (ATARAX/VISTARIL) tablet 25 mg  25 mg Oral Q6H PRN Nelly Rout, MD   25 mg at 02/26/13 2305  . loperamide (IMODIUM) capsule 2-4 mg  2-4 mg Oral PRN Nelly Rout, MD      . magnesium hydroxide (MILK OF MAGNESIA) suspension 30 mL  30 mL Oral Daily PRN Nelly Rout, MD      .  multivitamin with minerals tablet 1 tablet  1 tablet Oral Daily Nelly Rout, MD   1 tablet at 02/28/13 0748  . nicotine (NICODERM CQ - dosed in mg/24 hours) patch 21 mg  21 mg Transdermal Q0600 Rachael Fee, MD      . ondansetron (ZOFRAN-ODT) disintegrating tablet 4 mg  4 mg Oral Q6H PRN Nelly Rout, MD      . thiamine (VITAMIN B-1) tablet 100 mg  100 mg Oral Daily Nelly Rout, MD   100 mg at 02/28/13 0748  . traZODone (DESYREL) tablet 50 mg  50 mg Oral QHS PRN,MR X 1 Nanine Means, NP   50 mg at 02/27/13 2212    Lab Results: No results found for this or any  previous visit (from the past 48 hour(s)).  Physical Findings: AIMS: Facial and Oral Movements Muscles of Facial Expression: None, normal Lips and Perioral Area: None, normal Jaw: None, normal Tongue: None, normal,Extremity Movements Upper (arms, wrists, hands, fingers): None, normal Lower (legs, knees, ankles, toes): None, normal, Trunk Movements Neck, shoulders, hips: None, normal, Overall Severity Severity of abnormal movements (highest score from questions above): None, normal Incapacitation due to abnormal movements: None, normal Patient's awareness of abnormal movements (rate only patient's report): No Awareness, Dental Status Current problems with teeth and/or dentures?: No Does patient usually wear dentures?: No  CIWA:  CIWA-Ar Total: 0 COWS:     Treatment Plan Summary: Daily contact with patient to assess and evaluate symptoms and progress in treatment Medication management  Plan: Supportive approach/coping skills/relapse prevention           Continue to detox           Reassess co morbidities  Medical Decision Making Problem Points:  Review of psycho-social stressors (1) Data Points:  Review of medication regiment & side effects (2)  I certify that inpatient services furnished can reasonably be expected to improve the patient's condition.   Orenthal Debski A 02/28/2013, 5:40 PM

## 2013-02-28 NOTE — BHH Group Notes (Signed)
BHH Group Notes:  (Clinical Social Work)  02/28/2013     10-11AM  Summary of Progress/Problems:   The main focus of today's process group was for the patient to identify ways in which they have in the past sabotaged their own recovery. Motivational Interviewing was utilized to ask the group members what they get out of their substance use, and what reasons they may have for wanting to change.  The Stages of Change were explained using a handout, and patients identified where they currently are with regard to stages of change.  The patient expressed that his motivation to change is 7 out of 10, and that his self sabotage tends to involve always being around the same people, and "the same B.S.".  He did not really want to talk.  Type of Therapy:  Group Therapy - Process   Participation Level:  Minimal  Participation Quality:  Attentive and Drowsy  Affect:  Flat  Cognitive:  Oriented  Insight:  Limited  Engagement in Therapy:  Limited  Modes of Intervention:  Education, Support and Processing, Motivational Interviewing  Ambrose Mantle, LCSW 02/28/2013, 12:37 PM

## 2013-02-28 NOTE — Progress Notes (Signed)
D  Pt reports fair sleep after he requested something for sleep   He said his appetite is improving and his energy level is normal and ability to pay attention is improving   He rates his depression and hopelessness at a 4   Continues to complain of withdrawal symptoms including tremors, diarrhea,cravings,agitation,dizziness ,blurred vision,and headaches   He plans to work to keep busy and change the people he associates with to aid in taking better care of himself after discharge   He said he still wants to go to the [place] when a bed is available   He is cooperative and pleasant   Interacts well with others and attends groups  He reports on and off suicidal ideation but does contract for safety A   Verbal support given  Medications administered and effectiveness monitored    Discuss coping skills and signs and symptoms of withdrawal    Q 15 min checks R   Pt safe at present and contracts for safety
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
## 2013-02-28 NOTE — BHH Group Notes (Signed)
BHH Group Notes:  (Nursing/MHT/Case Management/Adjunct)  Date:  02/28/2013  Time:  1:25 PM  Type of Therapy:  Psychoeducational Skills  Participation Level:  None  Participation Quality:  Resistant  Affect:  Flat  Cognitive:  Alert  Insight:  Lacking  Engagement in Group:  Lacking  Modes of Intervention:  Problem-solving  Summary of Progress/Problems: Pt reported in group that he is a private person and that he was not talking.   Jacquelyne Balint Shanta 02/28/2013, 1:25 PM

## 2013-02-28 NOTE — Progress Notes (Signed)
Adult Psychoeducational Group Note  Date:  02/28/2013 Time:  3:31 PM  Group Topic/Focus:  Healthy Communication:   The focus of this group is to discuss communication, barriers to communication, as well as healthy ways to communicate with others.  Participation Level:  Did Not Attend    Additional Comments:  Pts are encouraged to attend all group sessions. Pt stayed in bed to sleep.   Tora Perches N 02/28/2013, 3:31 PM

## 2013-03-01 DIAGNOSIS — F411 Generalized anxiety disorder: Secondary | ICD-10-CM

## 2013-03-01 MED ORDER — QUETIAPINE FUMARATE 100 MG PO TABS
100.0000 mg | ORAL_TABLET | Freq: Every day | ORAL | Status: DC
  Administered 2013-03-02: 100 mg via ORAL
  Filled 2013-03-01 (×4): qty 1

## 2013-03-01 MED ORDER — PANTOPRAZOLE SODIUM 40 MG PO TBEC
40.0000 mg | DELAYED_RELEASE_TABLET | Freq: Every day | ORAL | Status: DC
  Administered 2013-03-01 – 2013-03-05 (×5): 40 mg via ORAL
  Filled 2013-03-01 (×8): qty 1

## 2013-03-01 MED ORDER — QUETIAPINE FUMARATE 50 MG PO TABS
50.0000 mg | ORAL_TABLET | Freq: Three times a day (TID) | ORAL | Status: DC
  Administered 2013-03-01 – 2013-03-03 (×5): 50 mg via ORAL
  Filled 2013-03-01 (×10): qty 1

## 2013-03-01 MED ORDER — SERTRALINE HCL 25 MG PO TABS
25.0000 mg | ORAL_TABLET | Freq: Every day | ORAL | Status: DC
  Administered 2013-03-01 – 2013-03-05 (×5): 25 mg via ORAL
  Filled 2013-03-01: qty 14
  Filled 2013-03-01 (×7): qty 1

## 2013-03-01 NOTE — Progress Notes (Signed)
Patient did attend the evening speaker AA meeting.  

## 2013-03-01 NOTE — Progress Notes (Signed)
The focus of this group is to educate the patient on the purpose and policies of crisis stabilization and provide a format to answer questions about their admission.  The group details unit policies and expectations of patients while admitted.  Patient did not attend group because he was in bed.

## 2013-03-01 NOTE — BHH Group Notes (Signed)
B[facility] Group Notes:  (Clinical Social Work)  03/01/2013  10:00-11:00AM  Summary of Progress/Problems:   The main focus of today's process group was to   identify the patient's current support system and decide on other supports that can be put in place.  The picture on workbook was used to discuss why additional supports are needed, and a hand-out was distributed with four definitions/levels of support, then used to talk about how patients have given and received all different kinds of support.  An emphasis was placed on using counselor, doctor, therapy groups, 12-step groups, and problem-specific support groups to expand supports.  The patient identified his therapist as a current support and is willing to add additional supports with the [facility] For substance abuse treatment as well as the [facility] if possible.  Type of Therapy:  Process Group with Motivational Interviewing  Participation Level:  Minimal  Participation Quality:  Attentive  Affect:  Blunted and Depressed  Cognitive:  Appropriate and Oriented  Insight:  Engaged  Engagement in Therapy:  Engaged  Modes of Intervention:   Education, Support and Processing, Activity  [employer], LCSW 03/01/2013, 12:39 PM

## 2013-03-01 NOTE — Progress Notes (Signed)
Patient did not attend the evening speaker AA meeting. Pt remained in bed sleeping.   

## 2013-03-01 NOTE — Progress Notes (Signed)
[facility] MD Progress Note  03/01/2013 8:29 PM Angel Golden  MRN:  [id number] Subjective:  Angel Golden that he is having nightmares of experiences while in the service. States he also has mood swings. States he is usually laid back, but without any provocations he gets very angry upset wants to hurt who ever he has in front. States that he has had these mood swings even when he was not using. Would like help with the symptoms. He is afraid he can lose control Diagnosis:   DSM5: Schizophrenia Disorders:   Obsessive-Compulsive Disorders:   Trauma-Stressor Disorders:  Posttraumatic Stress Disorder (309.81) Substance/Addictive Disorders:  Polysubstance Dependence Depressive Disorders:  Major Depressive Disorder - Moderate (296.22)  Axis I: Anxiety Disorder NOS and Mood Disorder NOS  ADL's:  Intact  Sleep: Poor  Appetite:  Fair  Suicidal Ideation: Denies  Homicidal Ideation:  Plan:  Denies Intent:  denies Means:  denies AEB (as evidenced by):  Psychiatric Specialty Exam: Review of Systems  Constitutional: Negative.   HENT: Negative.   Eyes: Negative.   Respiratory: Negative.   Cardiovascular: Negative.   Gastrointestinal: Negative.   Genitourinary: Negative.   Musculoskeletal: Negative.   Skin: Negative.   Neurological: Negative.   Endo/Heme/Allergies: Negative.   Psychiatric/Behavioral: Positive for depression and substance abuse. The patient is nervous/anxious and has insomnia.     Blood pressure 126/82, pulse 103, temperature 97.9 F (36.6 C), temperature source Oral, resp. rate 18, height 5' 6.5" (1.689 m), weight 77.565 kg (171 lb), SpO2 100.00%.Body mass index is 27.19 kg/(m^2).  General Appearance: Fairly Groomed  Patent attorney::  Fair  Speech:  Clear and Coherent and Slow  Volume:  Decreased  Mood:  Anxious, Depressed and Irritable  Affect:  Restricted  Thought Process:  Coherent and Goal Directed  Orientation:  Full (Time, Place, and Person)  Thought Content:   symptoms, worries, concerns,fear of losin control  Suicidal Thoughts:  Intermittent  Homicidal Thoughts:  No  Memory:  Immediate;   Fair Recent;   Fair Remote;   Fair  Judgement:  Fair  Insight:  Shallow  Psychomotor Activity:  Restlessness  Concentration:  Fair  Recall:  Fair  Akathisia:  No  Handed:    AIMS (if indicated):     Assets:  Desire for Improvement  Sleep:  Number of Hours: 5.75   Current Medications: Current Facility-Administered Medications  Medication Dose Route Frequency Provider Last Rate Last Dose  . acetaminophen (TYLENOL) tablet 650 mg  650 mg Oral Q6H PRN Nelly Rout, MD   650 mg at 02/27/13 2212  . alum & mag hydroxide-simeth (MAALOX/MYLANTA) 200-200-20 MG/5ML suspension 30 mL  30 mL Oral Q4H PRN Nelly Rout, MD   30 mL at 02/28/13 2210  . chlordiazePOXIDE (LIBRIUM) capsule 25 mg  25 mg Oral Q6H PRN Nelly Rout, MD      . Melene Muller ON 03/02/2013] chlordiazePOXIDE (LIBRIUM) capsule 25 mg  25 mg Oral BH-qamhs Nelly Rout, MD       Followed by  . [START ON 03/03/2013] chlordiazePOXIDE (LIBRIUM) capsule 25 mg  25 mg Oral Daily Nelly Rout, MD      . hydrOXYzine (ATARAX/VISTARIL) tablet 25 mg  25 mg Oral Q6H PRN Nelly Rout, MD   25 mg at 02/26/13 2305  . loperamide (IMODIUM) capsule 2-4 mg  2-4 mg Oral PRN Nelly Rout, MD      . magnesium hydroxide (MILK OF MAGNESIA) suspension 30 mL  30 mL Oral Daily PRN Nelly Rout, MD      .  multivitamin with minerals tablet 1 tablet  1 tablet Oral Daily Nelly Rout, MD   1 tablet at 03/01/13 0741  . nicotine (NICODERM CQ - dosed in mg/24 hours) patch 21 mg  21 mg Transdermal Q0600 Rachael Fee, MD   21 mg at 03/01/13 0741  . ondansetron (ZOFRAN-ODT) disintegrating tablet 4 mg  4 mg Oral Q6H PRN Nelly Rout, MD      . pantoprazole (PROTONIX) EC tablet 40 mg  40 mg Oral Daily Rachael Fee, MD   40 mg at 03/01/13 1711  . QUEtiapine (SEROQUEL) tablet 100 mg  100 mg Oral QHS Rachael Fee, MD      . QUEtiapine  (SEROQUEL) tablet 50 mg  50 mg Oral TID Rachael Fee, MD   50 mg at 03/01/13 1711  . sertraline (ZOLOFT) tablet 25 mg  25 mg Oral Daily Rachael Fee, MD   25 mg at 03/01/13 1711  . thiamine (VITAMIN B-1) tablet 100 mg  100 mg Oral Daily Nelly Rout, MD   100 mg at 03/01/13 0741  . traZODone (DESYREL) tablet 50 mg  50 mg Oral QHS PRN,MR X 1 Nanine Means, NP   50 mg at 02/28/13 2209    Lab Results: No results found for this or any previous visit (from the past 48 hour(s)).  Physical Findings: AIMS: Facial and Oral Movements Muscles of Facial Expression: None, normal Lips and Perioral Area: None, normal Jaw: None, normal Tongue: None, normal,Extremity Movements Upper (arms, wrists, hands, fingers): None, normal Lower (legs, knees, ankles, toes): None, normal, Trunk Movements Neck, shoulders, hips: None, normal, Overall Severity Severity of abnormal movements (highest score from questions above): None, normal Incapacitation due to abnormal movements: None, normal Patient's awareness of abnormal movements (rate only patient's report): No Awareness, Dental Status Current problems with teeth and/or dentures?: No Does patient usually wear dentures?: No  CIWA:  CIWA-Ar Total: 0 COWS:     Treatment Plan Summary: Daily contact with patient to assess and evaluate symptoms and progress in treatment Medication management  Plan: Supportive approach/coping skills/relapse prevention           Continue the detox           Seroquel 50 mg TID, 100 mg HS           Zoloft 25 mg daily              Medical Decision Making Problem Points:  Review of psycho-social stressors (1) Data Points:  Review of medication regiment & side effects (2) Review of new medications or change in dosage (2)  I certify that inpatient services furnished can reasonably be expected to improve the patient's condition.   Shalene Gallen A 03/01/2013, 8:29 PM

## 2013-03-01 NOTE — Progress Notes (Signed)
D- Patient is out on the unit attending groups and interacting with peers.  Rates depression and hopelessness at 6.  "Off and on" SI and contracts for safety.  C/O multiple withdrawal symptoms and is compliant with scheduled medication protocol.  Affect and mood are approriate.  A-  Support and encouragement offered.  Continue 15' checks for safety.  Continue POC and evaluation of treatment goals.  R- Safety maintained.

## 2013-03-01 NOTE — Progress Notes (Signed)
Spoke with pt 1:1 who is requesting trazadone for sleep. States he slept "ok" with it last night but woke up about 0330 with an "episode." "I have these night terrors. They say it's from the PTSD. I try not to yell out." Pt also complaining of gas. Pt supported, encouraged. Medicated with trazadone and maalox which on reassess were effective. Pt continues to endorse passive SI. "Those thoughts are always there. I also have some guys in mind that if I crossed paths with...it would not be good." "There are people here who get on my nerves but I can brush it off. I am gonna talk to Dr. Dub Mikes about these mood swings though." He verbally contracts for safety and remains safe on the unit. Lawrence Marseilles

## 2013-03-02 DIAGNOSIS — F1910 Other psychoactive substance abuse, uncomplicated: Secondary | ICD-10-CM

## 2013-03-02 DIAGNOSIS — F1010 Alcohol abuse, uncomplicated: Secondary | ICD-10-CM

## 2013-03-02 DIAGNOSIS — F332 Major depressive disorder, recurrent severe without psychotic features: Secondary | ICD-10-CM

## 2013-03-02 NOTE — Progress Notes (Signed)
[facility] MD Progress Note  03/02/2013 9:10 AM Angel Golden  MRN:  [id number] Subjective:  Patient lying in bed, not in group therapy due to a headache.  Depression remains elevated at a 6/10, denies suicidal ideations and hallucinations, sleep and appetite are "fine", withdrawal symptoms are 6/10 Diagnosis:   DSM5:  Substance/Addictive Disorders:  Alcohol Related Disorder - Severe (303.90) Depressive Disorders:  Major Depressive Disorder - Severe (296.23)  Axis I: Alcohol Abuse, Major Depression, Recurrent severe, Substance Abuse and Substance Induced Mood Disorder Axis II: Deferred Axis III:  Past Medical History  Diagnosis Date  . Cirrhosis   . Hepatitis C   . Hypertension    Axis IV: economic problems, other psychosocial or environmental problems, problems related to social environment and problems with primary support group Axis V: 41-50 serious symptoms  ADL's:  Intact  Sleep: Fair  Appetite:  Fair  Suicidal Ideation:  Denies Homicidal Ideation:  Denies  Psychiatric Specialty Exam: Review of Systems  Constitutional: Negative.   Eyes: Negative.   Respiratory: Negative.   Cardiovascular: Negative.   Gastrointestinal: Negative.   Genitourinary: Negative.   Musculoskeletal: Negative.   Skin: Negative.   Neurological: Positive for headaches.  Endo/Heme/Allergies: Negative.   Psychiatric/Behavioral: Positive for depression and substance abuse. The patient is nervous/anxious.     Blood pressure 119/88, pulse 91, temperature 97.5 F (36.4 C), temperature source Oral, resp. rate 20, height 5' 6.5" (1.689 m), weight 77.565 kg (171 lb), SpO2 100.00%.Body mass index is 27.19 kg/(m^2).  General Appearance: Casual  Eye Contact::  Fair  Speech:  Slow  Volume:  Decreased  Mood:  Anxious and Depressed  Affect:  Congruent  Thought Process:  Coherent  Orientation:  Full (Time, Place, and Person)  Thought Content:  WDL  Suicidal Thoughts:  No  Homicidal Thoughts:  No  Memory:   Immediate;   Fair Recent;   Fair Remote;   Fair  Judgement:  Fair  Insight:  Fair  Psychomotor Activity:  Decreased  Concentration:  Fair  Recall:  Fair  Akathisia:  No  Handed:  Right  AIMS (if indicated):     Assets:  Resilience  Sleep:  Number of Hours: 5.75   Current Medications: Current Facility-Administered Medications  Medication Dose Route Frequency Provider Last Rate Last Dose  . acetaminophen (TYLENOL) tablet 650 mg  650 mg Oral Q6H PRN Nelly Rout, MD   650 mg at 02/27/13 2212  . alum & mag hydroxide-simeth (MAALOX/MYLANTA) 200-200-20 MG/5ML suspension 30 mL  30 mL Oral Q4H PRN Nelly Rout, MD   30 mL at 02/28/13 2210  . chlordiazePOXIDE (LIBRIUM) capsule 25 mg  25 mg Oral BH-qamhs Nelly Rout, MD   25 mg at 03/02/13 0757   Followed by  . [START ON 03/03/2013] chlordiazePOXIDE (LIBRIUM) capsule 25 mg  25 mg Oral Daily Nelly Rout, MD      . magnesium hydroxide (MILK OF MAGNESIA) suspension 30 mL  30 mL Oral Daily PRN Nelly Rout, MD      . multivitamin with minerals tablet 1 tablet  1 tablet Oral Daily Nelly Rout, MD   1 tablet at 03/02/13 0758  . nicotine (NICODERM CQ - dosed in mg/24 hours) patch 21 mg  21 mg Transdermal Q0600 Rachael Fee, MD   21 mg at 03/02/13 0757  . pantoprazole (PROTONIX) EC tablet 40 mg  40 mg Oral Daily Rachael Fee, MD   40 mg at 03/02/13 0758  . QUEtiapine (SEROQUEL) tablet 100 mg  100  mg Oral QHS Rachael Fee, MD      . QUEtiapine (SEROQUEL) tablet 50 mg  50 mg Oral TID Rachael Fee, MD   50 mg at 03/02/13 0758  . sertraline (ZOLOFT) tablet 25 mg  25 mg Oral Daily Rachael Fee, MD   25 mg at 03/02/13 0758  . thiamine (VITAMIN B-1) tablet 100 mg  100 mg Oral Daily Nelly Rout, MD   100 mg at 03/02/13 0758  . traZODone (DESYREL) tablet 50 mg  50 mg Oral QHS PRN,MR X 1 Nanine Means, NP   50 mg at 02/28/13 2209    Lab Results: No results found for this or any previous visit (from the past 48 hour(s)).  Physical  Findings: AIMS: Facial and Oral Movements Muscles of Facial Expression: None, normal Lips and Perioral Area: None, normal Jaw: None, normal Tongue: None, normal,Extremity Movements Upper (arms, wrists, hands, fingers): None, normal Lower (legs, knees, ankles, toes): None, normal, Trunk Movements Neck, shoulders, hips: None, normal, Overall Severity Severity of abnormal movements (highest score from questions above): None, normal Incapacitation due to abnormal movements: None, normal Patient's awareness of abnormal movements (rate only patient's report): No Awareness, Dental Status Current problems with teeth and/or dentures?: No Does patient usually wear dentures?: No  CIWA:  CIWA-Ar Total: 0 COWS:     Treatment Plan Summary: Daily contact with patient to assess and evaluate symptoms and progress in treatment Medication management  Plan:  Review of chart, vital signs, medications, and notes. 1-Individual and group therapy 2-Medication management for depression, substance abuse, and anxiety:  Medications reviewed with the patient and he stated no untoward effects, no changes made 3-Coping skills for depression, anxiety, and substance abuse 4-Continue crisis stabilization and management 5-Address health issues--monitoring vital signs, stable 6-Treatment plan in progress to prevent relapse of depression, substance abuse, and anxiety  Medical Decision Making Problem Points:  Established problem, stable/improving (1) and Review of psycho-social stressors (1) Data Points:  Review of medication regiment & side effects (2)  I certify that inpatient services furnished can reasonably be expected to improve the patient's condition.   Nanine Means, PMH-NP 03/02/2013, 9:10 AM

## 2013-03-02 NOTE — Progress Notes (Signed)
Patient ID: Angel Golden, male   DOB: 1960-10-24, 52 y.o.   MRN: [id number]  D: Patient has a flat affect on approach today. Reports poor sleep and depression "6" and hopelessness "6". Reported some withdrawals as diarrhea, cravings, agitation, and chilling.  A: Staff will monitor on q 15 minute checks, follow treatment plan, and give meds as ordered. R: Took meds without issue and no complaints at present.

## 2013-03-02 NOTE — B[facility] Group Notes (Signed)
[facility] LCSW Aftercare Discharge Planning Group Note   03/02/2013 9:32 AM  Participation Quality:  Minimal   Mood/Affect:  Depressed  Depression Rating:  8  Anxiety Rating:  8  Thoughts of Suicide:  No Will you contract for safety?   NA  Current AVH:  No  Plan for Discharge/Comments:  Pt asked that CSW contact April Pharmacist, community) from [place]. CSW also to provide information/application to [facility] for pt. He follows up with Dr. Carlyle Basques at [facility] for med management.   Transportation Means: unknown at this time.   Supports: none identified by pt.   Smart, [employer]

## 2013-03-02 NOTE — Progress Notes (Signed)
D  Pt is pleasant and cooperative  He attends groups but has limited participation   He reports withdrawal symptoms have improved but continues to complain of poor and interupted sleep   His affect is sad and anxious A   Verbal support given   Medications administered and effectiveness monitored   Q 15 min checks R   Pt safe at present

## 2013-03-02 NOTE — Progress Notes (Signed)
Pt has slept all evening and night. Because he has complained of poor sleep and mood swings, pt was not awakened for 2200 seroquel. Pt in no distress. RR WNL, even and unlabored. Level III obs in place for safety. Pt remains safe. Lawrence Marseilles

## 2013-03-02 NOTE — BHH Group Notes (Signed)
[facility] LCSW Group Therapy  03/02/2013 2:56 PM  Type of Therapy:  Group Therapy  Participation Level:  Did Not Attend  Smart, Janiaya Ryser 03/02/2013, 2:56 PM

## 2013-03-02 NOTE — Clinical Social Work Note (Signed)
CSW left voicemail with April [facility] [place] transfer coordinator) to verify that pt is on waiting list and find out if/when he can be transferred to inpatient SA program at [facility]. Pt also provided with [facility] application and information.

## 2013-03-03 NOTE — Progress Notes (Signed)
Recreation Therapy Notes  Date: 10.21.2014 Time: 3:00pm Location: [address] Dayroom  Group Topic: Communication, Team Building, Problem Solving  Goal Area(s) Addresses:  Patient will effectively work with peer towards shared goal.  Patient will identify skill used to make activity successful.  Patient will identify how skills used during activity can be used to reach post d/c goals.   Behavioral Response: Did not attend.  Marykay Lex Aquilla Voiles, LRT/CTRS  Venita Seng L 03/03/2013 5:07 PM

## 2013-03-03 NOTE — Progress Notes (Signed)
[facility] MD Progress Note  03/03/2013 12:22 PM Angel Golden  MRN:  [id number] Subjective:  Angel Golden is having a hard time. His BP has been running los, and seems to have a orthosthatic component. He still endorses anxiety, depression, fear of going off allowing his mood swings his anger and irritability take over, but is more so having a hard time with the way he is feeling physically wise.  Diagnosis:   DSM5: Schizophrenia Disorders:   Obsessive-Compulsive Disorders:   Trauma-Stressor Disorders:  Posttraumatic Stress Disorder (309.81) Substance/Addictive Disorders:  Alcohol Related Disorder - Moderate (303.90) and Cannabis Use Disorder - Moderate 9304.30). 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  HENT: Negative.   Eyes: Negative.   Respiratory: Negative.   Cardiovascular: Negative.   Gastrointestinal: Negative.   Genitourinary: Negative.   Musculoskeletal: Negative.   Skin: Negative.   Neurological: Positive for dizziness and weakness.  Endo/Heme/Allergies: Negative.   Psychiatric/Behavioral: Positive for depression and substance abuse. The patient is nervous/anxious.     Blood pressure 110/64, pulse 92, temperature 98.2 F (36.8 C), temperature source Oral, resp. rate 20, height 5' 6.5" (1.689 m), weight 77.565 kg (171 lb), SpO2 100.00%.Body mass index is 27.19 kg/(m^2).  General Appearance: Disheveled and in bed  Eye Contact::  Minimal  Speech:  Clear and Coherent, Slow and not spontaneous  Volume:  Decreased  Mood:  Anxious, Depressed and light headed  Affect:  Restricted   Thought Process:  Coherent and Goal Directed  Orientation:  Full (Time, Place, and Person)  Thought Content:  worries, concerns  Suicidal Thoughts:  No  Homicidal Thoughts:  No  Memory:  Immediate;   Fair Recent;   Fair Remote;   Fair  Judgement:  Fair  Insight:  Present and Shallow  Psychomotor Activity:  Restlessness  Concentration:  Fair  Recall:  Fair  Akathisia:  No  Handed:    AIMS (if indicated):     Assets:  Desire for Improvement  Sleep:  Number of Hours: 6.5   Current Medications: Current Facility-Administered Medications  Medication Dose Route Frequency Provider Last Rate Last Dose  . acetaminophen (TYLENOL) tablet 650 mg  650 mg Oral Q6H PRN Nelly Rout, MD   650 mg at 02/27/13 2212  . alum & mag hydroxide-simeth (MAALOX/MYLANTA) 200-200-20 MG/5ML suspension 30 mL  30 mL Oral Q4H PRN Nelly Rout, MD   30 mL at 02/28/13 2210  . magnesium hydroxide (MILK OF MAGNESIA) suspension 30 mL  30 mL Oral Daily PRN Nelly Rout, MD      . multivitamin with minerals tablet 1 tablet  1 tablet Oral Daily Nelly Rout, MD   1 tablet at 03/03/13 0805  . nicotine (NICODERM CQ - dosed in mg/24 hours) patch 21 mg  21 mg Transdermal Q0600 Rachael Fee, MD   21 mg at 03/03/13 7829  . pantoprazole (PROTONIX) EC tablet 40 mg  40 mg Oral Daily Rachael Fee, MD   40 mg at 03/03/13 0805  . sertraline (ZOLOFT) tablet 25 mg  25 mg Oral Daily Rachael Fee, MD   25 mg at 03/03/13 0805  .  thiamine (VITAMIN B-1) tablet 100 mg  100 mg Oral Daily Nelly Rout, MD   100 mg at 03/03/13 0805  . traZODone (DESYREL) tablet 50 mg  50 mg Oral QHS PRN,MR X 1 Nanine Means, NP   50 mg at 03/02/13 2239    Lab Results: No results found for this or any previous visit (from the past 48 hour(s)).  Physical Findings: AIMS: Facial and Oral Movements Muscles of Facial Expression: None, normal Lips and Perioral Area: None, normal Jaw: None, normal Tongue: None, normal,Extremity Movements Upper (arms,  wrists, hands, fingers): None, normal Lower (legs, knees, ankles, toes): None, normal, Trunk Movements Neck, shoulders, hips: None, normal, Overall Severity Severity of abnormal movements (highest score from questions above): None, normal Incapacitation due to abnormal movements: None, normal Patient's awareness of abnormal movements (rate only patient's report): No Awareness, Dental Status Current problems with teeth and/or dentures?: No Does patient usually wear dentures?: No  CIWA:  CIWA-Ar Total: 0 COWS:     Treatment Plan Summary: Daily contact with patient to assess and evaluate symptoms and progress in treatment Medication management  Plan: Supportive approach/coping skills/relapse prevention           Will hold the Seroquel (possibel culprit)           Will reassess  Medical Decision Making Problem Points:  Review of psycho-social stressors (1) Data Points:  Review of medication regiment & side effects (2) Review of new medications or change in dosage (2)  I certify that inpatient services furnished can reasonably be expected to improve the patient's condition.   Kee Drudge A 03/03/2013, 12:22 PM

## 2013-03-03 NOTE — [facility] Group Notes (Signed)
[facility] LCSW Group Therapy  03/03/2013 1:25 PM  Type of Therapy:  Group Therapy  Participation Level:  Did Not Attend  Smart, Korry Dalgleish 03/03/2013, 1:25 PM  

## 2013-03-03 NOTE — Progress Notes (Signed)
Pt observed in bed with eyes closed.  Pt responded when name was called.  Pt reports he has been very tired and drowsy today.  He said he did not attend many groups.  He denies SI/HI/AV.  He voices no needs or concerns.  Pt was informed that he did not have any scheduled meds for tonight.  Encouraged pt to make his needs known to staff.  Pt still wishes to go to the [place] when possible.  Support and encouragement offered.  Safety maintained with q15 minute checks.

## 2013-03-03 NOTE — Progress Notes (Signed)
Recreation Therapy Notes  Date: 10.21.2014 Time: 2:30pm Location: [address] Dayroom  Group Topic: Software engineer Activities (AAA)  Behavioral Response: Did not attend.    Marykay Lex Redell Bhandari, LRT/CTRS  Jearl Klinefelter 03/03/2013 4:27 PM

## 2013-03-03 NOTE — Progress Notes (Signed)
D: Patient denies SI/HI and A/V hallucinations; patient reports sleep is fair; reports appetite is improving ; reports energy level is normal ; reports ability to pay attention is improving; rates depression as 5/10; rates hopelessness 6/10;   A: Monitored q 15 minutes; patient encouraged to attend groups; patient educated about medications; patient given medications per physician orders; patient encouraged to express feelings and/or concerns; patient encouraged to drink plenty of fluids  R: Patient is appropriate but forwards little information; patient's interaction with staff and peers is appropriate and assertive; I; patient is taking medications as prescribed and tolerating medications; patient is attending all groups

## 2013-03-03 NOTE — BHH Group Notes (Signed)
Adult Psychoeducational Group Note  Date:  03/03/2013 Time:  8:48 PM  Group Topic/Focus:  Wrap-Up Group:   The focus of this group is to help patients review their daily goal of treatment and discuss progress on daily workbooks.  Participation Level:  Active  Participation Quality:  Appropriate  Affect:  Flat  Cognitive:  Appropriate  Insight: Appropriate  Engagement in Group:  Engaged  Modes of Intervention:  Discussion  Additional Comments:  Jeramyah stated that his day was rough and he didn't feel good.  He also expressed that he needed to get to the [place] and not to the transitional housing he's supposed to go to.  Caroll Rancher A 03/03/2013, 8:48 PM

## 2013-03-03 NOTE — Progress Notes (Signed)
Adult Psychoeducational Group Note  Date:  03/03/2013 Time:  11:35 AM  Group Topic/Focus:  Developing a Wellness Toolbox:   The focus of this group is to help patients develop a "wellness toolbox" with skills and strategies to promote recovery upon discharge.  Participation Level:  Minimal  Participation Quality:  Appropriate  Affect:  Flat  Cognitive:  Appropriate  Insight: Limited  Engagement in Group:  Limited and Poor  Modes of Intervention:  Clarification and Limit-setting  Additional Comments:   Just wanted to focus on himself and made no additional comment.  Oliva Bustard 03/03/2013, 11:35 AM

## 2013-03-04 NOTE — Progress Notes (Signed)
D: Patient denies SI/HI and A/V hallucinations;patient reports that he slept well; patient reports that he has no dizziness and lightheadedness at this time; physician is aware; patient reports that he is feeling better after the seroquel has been discontinued  A: Monitored q 15 minutes; patient encouraged to attend groups; patient educated about medications; patient given medications per physician orders; patient encouraged to express feelings and/or concerns  R: Patient is appropriate but minimal; patient is pleasant but flat ; patient's interaction with staff and peers is appropriate; patient was able to set goal to talk with staff 1:1 when having feelings of SI; patient is taking medications as prescribed and tolerating medications; patient is attending all group
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
## 2013-03-04 NOTE — Progress Notes (Signed)
Pt reports he is doing better this evening, but is still tired.  At this time, he is lying in his bed awake.  He denies SI/HI/AV.  Pt is not having any withdrawal symptoms at this time.  Pt voices no needs or concerns at this time.  Pt would like to get his f/u at the [place].  Support and encouragement offered.  Safety maintained with q15 minute checks.

## 2013-03-04 NOTE — Progress Notes (Signed)
[facility] MD Progress Note  03/04/2013 4:24 PM Angel Golden  MRN:  [id number] Subjective:  As the Seroquel is getting out of his system he has able to get out of bed. He is not having the orthostatic changes. He is still feeling anxiious, with mood instability with feeling "50 % there." He will have an appointment for assessment at the [facility] to get into the residential treatment center. He continues to express worry and concern as far as his mood instability and his fear of 'goign off."  Diagnosis:   DSM5: Schizophrenia Disorders:   Obsessive-Compulsive Disorders:   Trauma-Stressor Disorders:  Posttraumatic Stress Disorder (309.81) Substance/Addictive Disorders:  Polysubstance Dependence Depressive Disorders:  Major Depressive Disorder - Moderate (296.22)  Axis I: Mood Disorder NOS  ADL's:  Intact  Sleep: Poor  Appetite:  Poor  Suicidal Ideation:  Plan:  denies Intent:  denies Means:  denies Homicidal Ideation:  Plan:  denies Intent:  denies Means:  denies AEB (as evidenced by):  Psychiatric Specialty Exam: Review of Systems  Constitutional: Negative.   HENT: Negative.   Eyes: Negative.   Respiratory: Negative.   Cardiovascular: Negative.   Gastrointestinal: Negative.   Genitourinary: Negative.   Musculoskeletal: Negative.   Skin: Negative.   Neurological: Negative.   Endo/Heme/Allergies: Negative.   Psychiatric/Behavioral: Positive for depression and substance abuse. The patient is nervous/anxious.     Blood pressure 117/86, pulse 85, temperature 98.2 F (36.8 C), temperature source Oral, resp. rate 18, height 5' 6.5" (1.689 m), weight 77.565 kg (171 lb), SpO2 100.00%.Body mass index is 27.19 kg/(m^2).  General Appearance: Disheveled  Eye Solicitor::  Fair  Speech:  Clear and Coherent, Slow and not spontaneous  Volume:  Decreased  Mood:  Anxious and Depressed  Affect:  Restricted  Thought Process:  Coherent and Goal Directed  Orientation:  Full (Time, Place, and Person)   Thought Content:  Rumination and worries, concerns  Suicidal Thoughts:  No  Homicidal Thoughts:  No  Memory:  Immediate;   Fair Recent;   Fair Remote;   Fair  Judgement:  Fair  Insight:  Present and Shallow  Psychomotor Activity:  Restlessness  Concentration:  Fair  Recall:  Fair  Akathisia:  No  Handed:    AIMS (if indicated):     Assets:  Desire for Improvement  Sleep:  Number of Hours: 6.75   Current Medications: Current Facility-Administered Medications  Medication Dose Route Frequency Provider Last Rate Last Dose  . acetaminophen (TYLENOL) tablet 650 mg  650 mg Oral Q6H PRN Nelly Rout, MD   650 mg at 03/03/13 1805  . alum & mag hydroxide-simeth (MAALOX/MYLANTA) 200-200-20 MG/5ML suspension 30 mL  30 mL Oral Q4H PRN Nelly Rout, MD   30 mL at 02/28/13 2210  . magnesium hydroxide (MILK OF MAGNESIA) suspension 30 mL  30 mL Oral Daily PRN Nelly Rout, MD      . multivitamin with minerals tablet 1 tablet  1 tablet Oral Daily Nelly Rout, MD   1 tablet at 03/04/13 0751  . nicotine (NICODERM CQ - dosed in mg/24 hours) patch 21 mg  21 mg Transdermal Q0600 Rachael Fee, MD   21 mg at 03/04/13 [phone]  . pantoprazole (PROTONIX) EC tablet 40 mg  40 mg Oral Daily Rachael Fee, MD   40 mg at 03/04/13 0751  . sertraline (ZOLOFT) tablet 25 mg  25 mg Oral Daily Rachael Fee, MD   25 mg at 03/04/13 0751  . thiamine (VITAMIN B-1) tablet  100 mg  100 mg Oral Daily Nelly Rout, MD   100 mg at 03/04/13 0751  . traZODone (DESYREL) tablet 50 mg  50 mg Oral QHS PRN,MR X 1 Nanine Means, NP   50 mg at 03/02/13 2239    Lab Results: No results found for this or any previous visit (from the past 48 hour(s)).  Physical Findings: AIMS: Facial and Oral Movements Muscles of Facial Expression: None, normal Lips and Perioral Area: None, normal Jaw: None, normal Tongue: None, normal,Extremity Movements Upper (arms, wrists, hands, fingers): None, normal Lower (legs, knees, ankles, toes): None,  normal, Trunk Movements Neck, shoulders, hips: None, normal, Overall Severity Severity of abnormal movements (highest score from questions above): None, normal Incapacitation due to abnormal movements: None, normal Patient's awareness of abnormal movements (rate only patient's report): No Awareness, Dental Status Current problems with teeth and/or dentures?: No Does patient usually wear dentures?: No  CIWA:  CIWA-Ar Total: 0 COWS:     Treatment Plan Summary: Daily contact with patient to assess and evaluate symptoms and progress in treatment Medication management  Plan: Supportive approach/coping skills/relapse prevention           CBT/anger management           Will hold any other psychotropic medications until the effect of Seroquel is completely gone  Medical Decision Making Problem Points:  Review of psycho-social stressors (1) Data Points:  Review of medication regiment & side effects (2)  I certify that inpatient services furnished can reasonably be expected to improve the patient's condition.   Tauno Falotico A 03/04/2013, 4:24 PM

## 2013-03-04 NOTE — [facility] Group Notes (Signed)
Adult Psychoeducational Group Note  Date:  03/04/2013 Time:  9:46 PM  Group Topic/Focus:  AA Meeting  Participation Level:  Active  Participation Quality:  Appropriate  Affect:  Appropriate  Cognitive:  Appropriate  Insight: Appropriate  Engagement in Group:  Engaged  Modes of Intervention:  Discussion and Education  Additional Comments:  Keaten attended [employer].  Caroll Rancher A 03/04/2013, 9:46 PM

## 2013-03-04 NOTE — [facility] Group Notes (Signed)
[facility] LCSW Aftercare Discharge Planning Group Note   03/04/2013 9:17 AM  Participation Quality:  Minimal   Mood/Affect:  Depressed and Flat  Depression Rating:  5  Anxiety Rating:  3  Thoughts of Suicide:  No Will you contract for safety?   NA  Current AVH:  No  Plan for Discharge/Comments:  Pt reports that he is feeling "fine today." Pt made aware that he was on waiting list for detox only at [facility], not for the SA program. Pt must go to [facility] on either Tuesday or Thursday between 8:30Am-11Am for screening for SA treatment program. Pt plans to follow up with Dr. Carlyle Basques at [facility]. He declined to fill out application for [employer] and stated that he "has a place to stay" but would not say where or with whom.   Transportation Means: unknown at this time.   Supports: none identified by pt.   Smart, [employer]

## 2013-03-04 NOTE — Tx Team (Signed)
Interdisciplinary Treatment Plan Update (Adult)  Date: 03/04/2013    Time Reviewed: 10:11 AM  Progress in Treatment:  Attending groups: Intermittently  Participating in groups:  Minimally  Taking medication as prescribed: Yes  Tolerating medication: Yes  Family/Significant othe contact made: Pt refused family contact. SPE completed with pt.    Patient understands diagnosis: Yes, AEB seeking treatment for SI/HI, depression, ETOH detox, and mood stabilization. Discussing patient identified problems/goals with staff: Yes  Medical problems stabilized or resolved: Yes  Denies suicidal/homicidal ideation: yes, in group/self report.  Patient has not harmed self or Others: Yes  New problem(s) identified: Labile/irritable mood and resistant to speaking in group/individual session.  Discharge Plan or Barriers: Pt was on waiting list at [facility] for detox only. Pt has screening for SA treatment on Tues/Thursday between 8:30Am-11Am. He plans to live with a friend and follow up at [facility] with Dr. Carlyle Basques for med management in the meantime. D/c scheduled for Thursday (tentatively). Additional comments: none Reason for Continuation of Hospitalization: Medication management  Mood stabilization Estimated length of stay: 1 day  For review of initial/current patient goals, please see plan of care.  Attendees:  Patient:    Family:    Physician: Geoffery Lyons MD 03/04/2013 10:11 AM   Nursing: Philippa Chester RN  03/04/2013 10:11 AM   Clinical Social Worker [employer], LCSWA  03/04/2013 10:11 AM   Other: Darden Dates Nurse CM  03/04/2013 10:11 AM   Other: Belenda Cruise RN 03/04/2013 10:12 AM   Other: Fleet Contras PhD Psych Intern  03/04/2013 10:11 AM   Other:    Scribe for Treatment Team:  [employer] LCSWA 03/04/2013 10:11 AM

## 2013-03-05 MED ORDER — TRAZODONE HCL 50 MG PO TABS: 50.0000 mg | ORAL_TABLET | Freq: Every evening | ORAL | Status: DC | PRN

## 2013-03-05 MED ORDER — PANTOPRAZOLE SODIUM 40 MG PO TBEC: 40.0000 mg | DELAYED_RELEASE_TABLET | Freq: Every day | ORAL | Status: DC

## 2013-03-05 MED ORDER — SERTRALINE HCL 25 MG PO TABS: 25.0000 mg | ORAL_TABLET | Freq: Every day | ORAL | Status: DC

## 2013-03-05 NOTE — Progress Notes (Signed)
[facility] Adult Case Management Discharge Plan :  Will you be returning to the same living situation after discharge: Yes,  home At discharge, do you have transportation home?:Yes,  friend Do you have the ability to pay for your medications:Yes,  mental health  Release of information consent forms completed and in the chart;  Patient's signature needed at discharge.  Patient to Follow up at: Follow-up Information   Follow up with [facility] On 03/12/2013. (Arrive between 8:30AM-11:00AM for screening for the Substance Abuse Program offered through this facility.)    Contact information:   [address] [place], [place] [address] Phone: [phone] ext. [phone] Fax: [phone]       [facility]. (Please call [phone] after discharge in order to schedule your hospital follow-up with Dr. Mack Guise (I am not able to make a follow-up appt for you until you have discharged from the hospital per the receptionist at the [facility]patient [facility]).)    Contact information:   [address] [place], [place] [address] Phone: [phone] Fax: [phone]      Patient denies SI/HI:   Yes,  during group/self report.    Safety Planning and Suicide Prevention discussed:  Yes,  SPE completed with pt. Pt refused to consent to family contact. SPI pamphlet provided to pt and he was encouraged to ask questions, talk about any concerns, and share information with support network.   Angel Golden, Angel Golden 03/05/2013, 10:16 AM

## 2013-03-05 NOTE — Discharge Summary (Signed)
D-Pt sts he sleep well and hes feeling much better, his appetite is improving, feels somewhat depressed and somewhat hopeless A-Pt sts he will stay focused and attend all groups, pt taking all medications, also sts [facility] R-Pt denies SI/HI, pt monitored q15 minutes and contracts for safety

## 2013-03-05 NOTE — Progress Notes (Signed)
Adult Psychoeducational Group Note  Date:  03/05/2013 Time:  9:54 AM  Group Topic/Focus:  Nursing group with Lupita Leash, discussed orientation, unit guidelines and a personal goal for the day.  Participation Level:  Minimal  Participation Quality:  Appropriate  Affect:  Appropriate  Cognitive:  Alert and Oriented  Insight: Good  Engagement in Group:  Engaged  Modes of Intervention:  Discussion, Education, Orientation and Support  Additional Comments:  Pt attended group and stated he was leaving today. His goal is to go out and make some money. He states he is ready to leave.  Reynolds Bowl 03/05/2013, 9:54 AM

## 2013-03-05 NOTE — [facility] Suicide Risk Assessment (Signed)
Suicide Risk Assessment  Discharge Assessment     Demographic Factors:  Male and Unemployed  Mental Status Per Nursing Assessment::   On Admission:     Current Mental Status by Physician:In full contact with reality. There are no suicidal ideas, plans or intent. His mood is worried, affect is appropriate. No active withdrawal. He is going [facility]   Loss Factors: NA  Historical Factors: NA  Risk Reduction Factors:   wants to get better, support trough the [facility]  Continued Clinical Symptoms:  Alcohol/Substance Abuse/Dependencies  Cognitive Features That Contribute To Risk:  Closed-mindedness Polarized thinking Thought constriction (tunnel vision)    Suicide Risk:  Minimal: No identifiable suicidal ideation.  Patients presenting with no risk factors but with morbid ruminations; may be classified as minimal risk based on the severity of the depressive symptoms  Discharge Diagnoses:   AXIS I:  Mood Disorder NOS and Post Traumatic Stress Disorder, Polysubstance Dependence AXIS II:  Deferred AXIS III:   Past Medical History  Diagnosis Date  . Cirrhosis   . Hepatitis C   . Hypertension    AXIS IV:  economic problems, housing problems, occupational problems and other psychosocial or environmental problems AXIS V:  51-60 moderate symptoms  Plan Of Care/Follow-up recommendations:  Activity:  as tolerated Diet:  regular [facility] Is patient on multiple antipsychotic therapies at discharge:  No   Has Patient had three or more failed trials of antipsychotic monotherapy by history:  No  Recommended Plan for Multiple Antipsychotic Therapies: NA  Pam Vanalstine A 03/05/2013, 9:02 AM

## 2013-03-09 NOTE — Discharge Summary (Signed)
Physician Discharge Summary Note  Patient:  Angel Golden is an 52 y.o., male MRN:  [id number] DOB:  1960/09/07 Patient phone:  [phone] (home)  Patient address:   [address]  [place] [address],   Date of Admission:  02/26/2013 Date of Discharge: 03/06/2013  Reason for Admission:  52 Y/o male who requested help to deal with his mood swings as well as addiction to alcohol, crack, marijuana. He had been using for the last six months. He has been homeless, staying here and there since his brother kicked him out. He endorsed depression, suicidal ideas and plans, mood instability, with fears of going off. He stated that the mood instability wa present even when had not been using for a while. He also endorsed personal traumas as well as traumatic events while in the service that he still re experiences.   Discharge Diagnoses: Active Problems:   Polysubstance dependence   Unspecified episodic mood disorder   PTSD (post-traumatic stress disorder)  Review of Systems  Constitutional: Negative.   HENT: Negative.   Eyes: Negative.   Respiratory: Negative.   Cardiovascular: Negative.   Gastrointestinal: Negative.   Genitourinary: Negative.   Musculoskeletal: Negative.   Skin: Negative.   Neurological: Negative.   Endo/Heme/Allergies: Negative.   Psychiatric/Behavioral: Positive for depression and substance abuse. The patient is nervous/anxious.     DSM5:  Schizophrenia Disorders:  None Obsessive-Compulsive Disorders:  None Trauma-Stressor Disorders:  Posttraumatic Stress Disorder (309.81) Substance/Addictive Disorders:  Alcohol Related Disorder - Severe (303.90) and Cannabis Use Disorder - Severe (304.30), Cocaine related disorders severe Depressive Disorders:  Major Depressive Disorder - Moderate (296.22)  Axis Diagnosis:   AXIS I:  Mood Disorder NOS AXIS II:  Deferred AXIS III:   Past Medical History  Diagnosis Date  . Cirrhosis   . Hepatitis C   .  Hypertension    AXIS IV:  economic problems, housing problems, occupational problems, problems related to social environment and problems with access to health care services AXIS V:  51-60 moderate symptoms  Level of Care:  OP  Hospital Course:  He was admitted and started in intensive individual and group psychotherapy. He was detox with Librium. Detox went uneventfully he continued to complain of mood instability, anxiety. He was started on Zoloft and Seroquel. He had a negative response to the Seroquel his BP dropped and he experienced orthostatic changes. He was in bed for more than 24 hours. It was D/C He slowly got better. He continued to endorse the mood fluctuations as well as dreams, nightmares about the traumatic events. He stated that off the alcohol and drugs he felt more clear headed and able to address his other psychiatric symptoms. He was wanting to go to the [place] to do so. He was in full contact with reality with no suicidal ideas plans or intent and no S/S of withdrawal.  Consults:  None  Significant Diagnostic Studies:  As per the chart  Discharge Vitals:   Blood pressure 125/84, pulse 85, temperature 98.6 F (37 C), temperature source Oral, resp. rate 18, height 5' 6.5" (1.689 m), weight 77.565 kg (171 lb), SpO2 100.00%. Body mass index is 27.19 kg/(m^2). Lab Results:   No results found for this or any previous visit (from the past 72 hour(s)).  Physical Findings: AIMS: Facial and Oral Movements Muscles of Facial Expression: None, normal Lips and Perioral Area: None, normal Jaw: None, normal Tongue: None, normal,Extremity Movements Upper (arms, wrists, hands, fingers): None, normal Lower (legs, knees, ankles,  toes): None, normal, Trunk Movements Neck, shoulders, hips: None, normal, Overall Severity Severity of abnormal movements (highest score from questions above): None, normal Incapacitation due to abnormal movements: None, normal Patient's awareness of abnormal  movements (rate only patient's report): No Awareness, Dental Status Current problems with teeth and/or dentures?: No Does patient usually wear dentures?: No  CIWA:  CIWA-Ar Total: 2 COWS:     Psychiatric Specialty Exam: See Psychiatric Specialty Exam and Suicide Risk Assessment completed by Attending Physician prior to discharge.  Discharge destination:  Other:  will go to the [facility] ED for assessment and admission to their hospital  Is patient on multiple antipsychotic therapies at discharge:  No   Has Patient had three or more failed trials of antipsychotic monotherapy by history:  No  Recommended Plan for Multiple Antipsychotic Therapies: NA     Medication List       Indication   ibuprofen 200 MG tablet  Commonly known as:  ADVIL,MOTRIN  Take 400 mg by mouth every 8 (eight) hours as needed for headache.      pantoprazole 40 MG tablet  Commonly known as:  PROTONIX  Take 1 tablet (40 mg total) by mouth daily.   Indication:  Gastroesophageal Reflux Disease     sertraline 25 MG tablet  Commonly known as:  ZOLOFT  Take 1 tablet (25 mg total) by mouth daily.   Indication:  Major Depressive Disorder     traZODone 50 MG tablet  Commonly known as:  DESYREL  Take 1 tablet (50 mg total) by mouth at bedtime as needed and may repeat dose one time if needed for sleep.   Indication:  Trouble Sleeping           Follow-up Information   Follow up with [facility] On 03/12/2013. (Arrive between 8:30AM-11:00AM for screening for the Substance Abuse Program offered through this facility.)    Contact information:   [address] [place], [place] [address] Phone: [phone] ext. [phone] Fax: [phone]       [facility]. (Please call [phone] after discharge in order to schedule your hospital follow-up with Dr. Mack Guise (I am not able to make a follow-up appt for you until you have discharged from the hospital per the receptionist  at the [facility]).)    Contact information:   [address] [place], [place] [address] Phone: [phone] Fax: [phone]      Follow-up recommendations:  Activity:  as tolerated Diet:  regular  Comments:  Continue to work your relapse prevention plan  Total Discharge Time:  Greater than 30 minutes.  Signed: Marelin Tat A 03/09/2013, 11:29 PM

## 2013-03-10 NOTE — Progress Notes (Signed)
Patient Discharge Instructions:  After Visit Summary (AVS):   Faxed to:  03/10/13 Discharge Summary Note:   Faxed to:  03/10/13 Psychiatric Admission Assessment Note:   Faxed to:  03/10/13 Suicide Risk Assessment - Discharge Assessment:   Faxed to:  03/10/13 Faxed/Sent to the Next Level Care provider:  03/10/13 Faxed to [facility] - Dr. Mack Guise @ [phone] Faxed to [facility] @ [phone]  Jerelene Redden, 03/10/2013, 1:20 PM

## 2013-03-27 ENCOUNTER — Encounter (HOSPITAL_COMMUNITY): Payer: Self-pay

## 2013-03-27 ENCOUNTER — Encounter (HOSPITAL_COMMUNITY): Payer: Self-pay | Admitting: Emergency Medicine

## 2013-03-27 ENCOUNTER — Inpatient Hospital Stay (HOSPITAL_COMMUNITY)
Admission: AD | Admit: 2013-03-27 | Discharge: 2013-04-01 | DRG: 897 | Disposition: A | Discharge status: Home / Self Care | Payer: Federal, State, Local not specified - Other | Source: Intra-hospital | Attending: Psychiatry | Admitting: Psychiatry

## 2013-03-27 ENCOUNTER — Emergency Department (HOSPITAL_COMMUNITY)
Admission: EM | Admit: 2013-03-27 | Discharge: 2013-03-27 | Disposition: A | Discharge status: Other Acute Inpatient Hospital | Payer: Self-pay | Attending: Emergency Medicine | Admitting: Emergency Medicine

## 2013-03-27 DIAGNOSIS — Z79899 Other long term (current) drug therapy: Secondary | ICD-10-CM | POA: Insufficient documentation

## 2013-03-27 DIAGNOSIS — F39 Unspecified mood [affective] disorder: Secondary | ICD-10-CM | POA: Diagnosis present

## 2013-03-27 DIAGNOSIS — B1920 Unspecified viral hepatitis C without hepatic coma: Secondary | ICD-10-CM | POA: Diagnosis present

## 2013-03-27 DIAGNOSIS — F1920 Other psychoactive substance dependence, uncomplicated: Principal | ICD-10-CM | POA: Diagnosis present

## 2013-03-27 DIAGNOSIS — I10 Essential (primary) hypertension: Secondary | ICD-10-CM | POA: Insufficient documentation

## 2013-03-27 DIAGNOSIS — K7460 Unspecified cirrhosis of liver: Secondary | ICD-10-CM | POA: Diagnosis present

## 2013-03-27 DIAGNOSIS — Z8719 Personal history of other diseases of the digestive system: Secondary | ICD-10-CM | POA: Insufficient documentation

## 2013-03-27 DIAGNOSIS — R45850 Homicidal ideations: Secondary | ICD-10-CM

## 2013-03-27 DIAGNOSIS — F4310 Post-traumatic stress disorder, unspecified: Secondary | ICD-10-CM | POA: Diagnosis present

## 2013-03-27 DIAGNOSIS — F17200 Nicotine dependence, unspecified, uncomplicated: Secondary | ICD-10-CM | POA: Insufficient documentation

## 2013-03-27 DIAGNOSIS — Z8619 Personal history of other infectious and parasitic diseases: Secondary | ICD-10-CM | POA: Insufficient documentation

## 2013-03-27 DIAGNOSIS — R4589 Other symptoms and signs involving emotional state: Secondary | ICD-10-CM

## 2013-03-27 HISTORY — DX: Post-traumatic stress disorder, unspecified: F43.10

## 2013-03-27 HISTORY — DX: Enthesopathy, unspecified: M77.9

## 2013-03-27 LAB — CBC
Hemoglobin: 14.4 g/dL (ref 13.0–17.0)
MCHC: 33.3 g/dL (ref 30.0–36.0)
MCV: 89.8 fL (ref 78.0–100.0)
Platelets: 137 10*3/uL — ABNORMAL LOW (ref 150–400)
RBC: 4.81 MIL/uL (ref 4.22–5.81)
RDW: 14.8 % (ref 11.5–15.5)
WBC: 4.8 10*3/uL (ref 4.0–10.5)

## 2013-03-27 LAB — COMPREHENSIVE METABOLIC PANEL
ALT: 213 U/L — ABNORMAL HIGH (ref 0–53)
AST: 287 U/L — ABNORMAL HIGH (ref 0–37)
Albumin: 4.3 g/dL (ref 3.5–5.2)
Alkaline Phosphatase: 101 U/L (ref 39–117)
BUN: 9 mg/dL (ref 6–23)
CO2: 24 mEq/L (ref 19–32)
Chloride: 97 mEq/L (ref 96–112)
Creatinine, Ser: 0.96 mg/dL (ref 0.50–1.35)
GFR calc Af Amer: >90 mL/min (ref >90)
Potassium: 4.3 mEq/L (ref 3.5–5.1)
Sodium: 134 mEq/L — ABNORMAL LOW (ref 135–145)
Total Bilirubin: 2 mg/dL — ABNORMAL HIGH (ref 0.3–1.2)
Total Protein: 9.2 g/dL — ABNORMAL HIGH (ref 6.0–8.3)

## 2013-03-27 LAB — RAPID URINE DRUG SCREEN, HOSP PERFORMED
Amphetamines: NOT DETECTED
Barbiturates: NOT DETECTED
Benzodiazepines: POSITIVE — AB
Cocaine: POSITIVE — AB
Tetrahydrocannabinol: NOT DETECTED

## 2013-03-27 LAB — ETHANOL: Alcohol, Ethyl (B): <11 mg/dL (ref 0–11)

## 2013-03-27 LAB — ACETAMINOPHEN LEVEL: Acetaminophen (Tylenol), Serum: <15 ug/mL (ref 10–30)

## 2013-03-27 MED ORDER — PNEUMOCOCCAL VAC POLYVALENT 25 MCG/0.5ML IJ INJ
0.5000 mL | INJECTION | INTRAMUSCULAR | Status: AC
  Administered 2013-03-28: 0.5 mL via INTRAMUSCULAR

## 2013-03-27 MED ORDER — ALUM & MAG HYDROXIDE-SIMETH 200-200-20 MG/5ML PO SUSP: 30.0000 mL | ORAL | Status: DC | PRN

## 2013-03-27 MED ORDER — IBUPROFEN 200 MG PO TABS
400.0000 mg | ORAL_TABLET | Freq: Three times a day (TID) | ORAL | Status: DC | PRN
  Administered 2013-03-30: 400 mg via ORAL
  Filled 2013-03-27: qty 2

## 2013-03-27 MED ORDER — PANTOPRAZOLE SODIUM 40 MG PO TBEC
40.0000 mg | DELAYED_RELEASE_TABLET | Freq: Every day | ORAL | Status: DC
  Administered 2013-03-27 – 2013-04-01 (×6): 40 mg via ORAL
  Filled 2013-03-27 (×9): qty 1

## 2013-03-27 MED ORDER — SERTRALINE HCL 25 MG PO TABS
25.0000 mg | ORAL_TABLET | Freq: Every day | ORAL | Status: DC
  Administered 2013-03-27: 25 mg via ORAL

## 2013-03-27 MED ORDER — ACETAMINOPHEN 325 MG PO TABS
650.0000 mg | ORAL_TABLET | Freq: Four times a day (QID) | ORAL | Status: DC | PRN
  Administered 2013-03-28: 650 mg via ORAL
  Filled 2013-03-27: qty 2

## 2013-03-27 MED ORDER — SERTRALINE HCL 50 MG PO TABS
50.0000 mg | ORAL_TABLET | Freq: Every day | ORAL | Status: DC
  Administered 2013-03-28 – 2013-04-01 (×5): 50 mg via ORAL
  Filled 2013-03-27 (×8): qty 1

## 2013-03-27 MED ORDER — CHLORDIAZEPOXIDE HCL 25 MG PO CAPS
25.0000 mg | ORAL_CAPSULE | Freq: Three times a day (TID) | ORAL | Status: DC | PRN
  Administered 2013-03-28: 25 mg via ORAL
  Filled 2013-03-27: qty 1

## 2013-03-27 MED ORDER — MAGNESIUM HYDROXIDE 400 MG/5ML PO SUSP: 30.0000 mL | Freq: Every day | ORAL | Status: DC | PRN

## 2013-03-27 MED ORDER — HYDROXYZINE HCL 25 MG PO TABS
25.0000 mg | ORAL_TABLET | Freq: Four times a day (QID) | ORAL | Status: DC | PRN
  Administered 2013-03-31: 25 mg via ORAL
  Filled 2013-03-27: qty 1

## 2013-03-27 MED ORDER — TRAZODONE HCL 50 MG PO TABS: 50.0000 mg | ORAL_TABLET | Freq: Every evening | ORAL | Status: DC | PRN

## 2013-03-27 MED ORDER — NICOTINE 21 MG/24HR TD PT24
21.0000 mg | MEDICATED_PATCH | Freq: Every day | TRANSDERMAL | Status: DC
  Administered 2013-03-28 – 2013-04-01 (×5): 21 mg via TRANSDERMAL
  Filled 2013-03-27 (×7): qty 1
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
## 2013-03-27 NOTE — ED Notes (Signed)
Notified [facility] about transfer back to [facility].

## 2013-03-27 NOTE — Progress Notes (Signed)
[facility] provided pt with a [employer] Card application, highlighting [facility].  °

## 2013-03-27 NOTE — Progress Notes (Signed)
Patient observed in bed. Patient denies SI, AVH. Patient positive for HI towards an individual "who is not here". Patient states that he hopes to "obtain some sanity and some clarity". Rates depression 8, anxiety 8. Patient states that he does not plan to attend evening group as he ''would do them no good right now".   Encouragement offered. Patient continues to rest quietly.  Safety maintained, q 15 minute checks continue.

## 2013-03-27 NOTE — ED Notes (Signed)
Pt made aware of need for urine specimen 

## 2013-03-28 ENCOUNTER — Encounter (HOSPITAL_COMMUNITY): Payer: Self-pay | Admitting: Psychiatry

## 2013-03-28 DIAGNOSIS — F1010 Alcohol abuse, uncomplicated: Secondary | ICD-10-CM

## 2013-03-28 DIAGNOSIS — F1910 Other psychoactive substance abuse, uncomplicated: Secondary | ICD-10-CM

## 2013-03-28 MED ORDER — TRAZODONE HCL 100 MG PO TABS
100.0000 mg | ORAL_TABLET | Freq: Every evening | ORAL | Status: DC | PRN
  Administered 2013-03-28 – 2013-03-31 (×4): 100 mg via ORAL
  Filled 2013-03-28 (×3): qty 1
  Filled 2013-03-28: qty 14
  Filled 2013-03-28: qty 1

## 2013-03-28 NOTE — Clinical Social Work Note (Signed)
Clinical Social Work Note  At [employer] request, CSW provided clothing to patient, including shirt and shorts, from the clothing closet.  Ambrose Mantle, LCSW 03/28/2013, 5:20 PM
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
## 2013-03-28 NOTE — [facility] Suicide Risk Assessment (Signed)
Suicide Risk Assessment  Admission Assessment     Nursing information obtained from:    Demographic factors:    Current Mental Status:    Loss Factors:    Historical Factors:    Risk Reduction Factors:     CLINICAL FACTORS:   Severe Anxiety and/or Agitation Depression:   Aggression Anhedonia Comorbid alcohol abuse/dependence Hopelessness Impulsivity Insomnia Recent sense of peace/wellbeing Severe Alcohol/Substance Abuse/Dependencies Unstable or Poor Therapeutic Relationship Previous Psychiatric Diagnoses and Treatments  COGNITIVE FEATURES THAT CONTRIBUTE TO RISK:  Closed-mindedness Loss of executive function Polarized thinking Thought constriction (tunnel vision)    SUICIDE RISK:   Moderate:  Frequent suicidal ideation with limited intensity, and duration, some specificity in terms of plans, no associated intent, good self-control, limited dysphoria/symptomatology, some risk factors present, and identifiable protective factors, including available and accessible social support.  PLAN OF CARE: Patient is receiving inpatient acute psychiatric hospitalization program and medication management. Patient needed case management regarding disposition plans. Patient has polysubstance dependence and multiple previous admissions.  I certify that inpatient services furnished can reasonably be expected to improve the patient's condition.  Angel Golden,JANARDHAHA R. 03/28/2013, 1:39 PM

## 2013-03-28 NOTE — Progress Notes (Signed)
Pt attended AA group 
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
## 2013-03-29 DIAGNOSIS — F39 Unspecified mood [affective] disorder: Secondary | ICD-10-CM

## 2013-03-29 DIAGNOSIS — F1020 Alcohol dependence, uncomplicated: Secondary | ICD-10-CM

## 2013-03-29 NOTE — Progress Notes (Signed)
[facility] MD Progress Note  03/29/2013 1:07 PM Angel Golden  MRN:  [id number] Subjective:  Patient is alert and oriented, engaged in assessment and fu visit. He reports sleeping better last night with the use of Trazodone. He denies SI.HI and hallucinations.  His appetite is good.  Depression/Mood is rated 7/10 and Anxiety 7/10.   He speaks   Diagnosis:   DSM5: Substance/Addictive Disorders:  Alcohol Related Disorder - Mild (305.00) and Alcohol Related Disorder - Severe (303.90) Depressive Disorders:  Major Depressive Disorder (296.99)  Axis I: Mood Disorder NOS  ADL's:  Intact  Sleep: Good  Appetite:  Good  Suicidal Ideation: denies  Homicidal Ideation: denies  AEB (as evidenced by):  Psychiatric Specialty Exam: Review of Systems  Constitutional: Negative.   HENT: Negative.   Eyes: Negative.   Respiratory: Negative.   Cardiovascular: Negative.   Gastrointestinal: Negative.   Genitourinary: Negative.   Musculoskeletal: Negative.   Skin: Negative.   Neurological: Negative.   Endo/Heme/Allergies: Negative.   Psychiatric/Behavioral: Positive for depression and substance abuse. The patient is nervous/anxious.     Blood pressure 122/79, pulse 72, temperature 97.6 F (36.4 C), temperature source Oral, resp. rate 16, height 5' 7.72" (1.72 m), weight 73.936 kg (163 lb).Body mass index is 24.99 kg/(m^2).  General Appearance: Casual  Eye Contact::  Good  Speech:  Clear and Coherent  Volume:  Normal  Mood:  Depressed  Affect:  Congruent  Thought Process:  Linear and Logical  Orientation:  Full (Time, Place, and Person)  Thought Content:  WDL  Suicidal Thoughts:  No  Homicidal Thoughts:  No  Memory:  Immediate;   Good Recent;   Good Remote;   Good  Judgement:  Fair  Insight:  Fair  Psychomotor Activity:  Normal  Concentration:  Good  Recall:  Good  Akathisia:  No  Handed:  Right  AIMS (if indicated):     Assets:  Resilience  Sleep:  Number of Hours: 6.5   Current  Medications: Current Facility-Administered Medications  Medication Dose Route Frequency Provider Last Rate Last Dose  . acetaminophen (TYLENOL) tablet 650 mg  650 mg Oral Q6H PRN Rachael Fee, MD   650 mg at 03/28/13 2113  . alum & mag hydroxide-simeth (MAALOX/MYLANTA) 200-200-20 MG/5ML suspension 30 mL  30 mL Oral Q4H PRN Rachael Fee, MD      . chlordiazePOXIDE (LIBRIUM) capsule 25 mg  25 mg Oral Q8H PRN Rachael Fee, MD   25 mg at 03/28/13 1207  . hydrOXYzine (ATARAX/VISTARIL) tablet 25 mg  25 mg Oral Q6H PRN Rachael Fee, MD      . ibuprofen (ADVIL,MOTRIN) tablet 400 mg  400 mg Oral Q8H PRN Rachael Fee, MD      . magnesium hydroxide (MILK OF MAGNESIA) suspension 30 mL  30 mL Oral Daily PRN Rachael Fee, MD      . nicotine (NICODERM CQ - dosed in mg/24 hours) patch 21 mg  21 mg Transdermal Q0600 Rachael Fee, MD   21 mg at 03/29/13 0641  . pantoprazole (PROTONIX) EC tablet 40 mg  40 mg Oral Daily Rachael Fee, MD   40 mg at 03/29/13 0834  . sertraline (ZOLOFT) tablet 50 mg  50 mg Oral Daily Rachael Fee, MD   50 mg at 03/29/13 0834  . traZODone (DESYREL) tablet 100 mg  100 mg Oral QHS PRN Nanine Means, NP   100 mg at 03/28/13 2112    Lab Results: No  results found for this or any previous visit (from the past 48 hour(s)).  Physical Findings: AIMS: Facial and Oral Movements Muscles of Facial Expression: None, normal Lips and Perioral Area: None, normal Jaw: None, normal Tongue: None, normal,Extremity Movements Upper (arms, wrists, hands, fingers): None, normal Lower (legs, knees, ankles, toes): None, normal, Trunk Movements Neck, shoulders, hips: None, normal, Overall Severity Severity of abnormal movements (highest score from questions above): None, normal Incapacitation due to abnormal movements: None, normal Patient's awareness of abnormal movements (rate only patient's report): No Awareness, Dental Status Current problems with teeth and/or dentures?: No Does patient  usually wear dentures?: No  CIWA:  CIWA-Ar Total: 0 COWS:  COWS Total Score: 2  Treatment Plan Summary:  Daily contact with patient to assess and evaluate symptoms and progress in treatment  Medication management Plan: 1. Continue crisis management and stabilization 2. Medication management to reduce current symptoms to baseline and improve patient's overall level of functioning 3.  Group and individual therapy encouraged 4. Coping skills for depression, anxiety  5. Develop treatment plan  To decrease risk of relapse upon discharge and the need for readmission 6. Psycho-social eduction regarding relaspe prevention and self-care. Disposition on process  Medical Decision Making Problem Points:  Established problem, stable/improving (1) Data Points:  Review of medication regiment & side effects (2)  I certify that inpatient services furnished can reasonably be expected to improve the patient's condition.   Lorinda Creed   PMHNP 03/29/2013, 1:07 PM

## 2013-03-29 NOTE — [facility] Group Notes (Signed)
[facility] Group Notes:  (Clinical Social Work)  03/29/2013  10:00-11:00AM  Summary of Progress/Problems:   The main focus of today's process group was to identify the patient's current support system and decide on other supports that can be put in place.  An emphasis was placed on AA/NA, support groups, therapy groups, and other professional supports in addition to natural supports.  Illustrations were used to demonstrate the need for additional supports.  A lengthy discussion ensued about unhealthy versus healthy supports.  The patient has current supports of his mother and is willing to seek others, understands the importance of building system in order to preempt future crisis.  He was particularly relieved when CSW insisted on horseplay within the group stopping, and when those participants who were disruptive left the group.  Type of Therapy:  Process Group   Participation Level:  Active  Participation Quality:  Attentive, Sharing and Supportive  Affect:  Appropriate  Cognitive:  Appropriate and Oriented  Insight:  Engaged  Engagement in Therapy:  Engaged  Modes of Intervention:   Education, Support and [employer], LCSW 03/29/2013, 12:15pm

## 2013-03-29 NOTE — Progress Notes (Signed)
Pt resting in bed, eyes closed, breathing even and unlabored. No signs of distress noted. Will continue to monitor pt. Q15 min checks maintained for safety. 

## 2013-03-29 NOTE — Progress Notes (Signed)
D: Patient resting in bed with eyes closed.  Respirations even and unlabored.  Patient appears to be in no apparent distress. A: Staff to monitor Q 15 mins for safety.   R:Patient remains safe on the unit.  

## 2013-03-30 DIAGNOSIS — F1920 Other psychoactive substance dependence, uncomplicated: Principal | ICD-10-CM

## 2013-03-30 DIAGNOSIS — F4310 Post-traumatic stress disorder, unspecified: Secondary | ICD-10-CM
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
## 2013-03-30 NOTE — [facility] Group Notes (Signed)
[facility] LCSW Group Therapy  03/30/2013  1:15 PM   Type of Therapy:  Group Therapy  Participation Level:  Did Not Attend  Reyes Ivan, LCSW 03/30/2013 2:38 PM

## 2013-03-30 NOTE — Tx Team (Addendum)
Interdisciplinary Treatment Plan Update (Adult)  Date: 03/30/2013  Time Reviewed:  9:45 AM  Progress in Treatment: Attending groups: Yes Participating in groups:  Yes Taking medication as prescribed:  Yes Tolerating medication:  Yes Family/Significant othe contact made: CSW assessing Patient understands diagnosis:  Yes Discussing patient identified problems/goals with staff:  Yes Medical problems stabilized or resolved:  Yes Denies suicidal/homicidal ideation: No, endorses HI today Issues/concerns per patient self-inventory:  Yes Other:  New problem(s) identified: N/A  Discharge Plan or Barriers: CSW assessing for appropriate referrals.    Reason for Continuation of Hospitalization: Anxiety Depression Detox Medication Stabilization  Comments: N/A  Estimated length of stay: 2-3 days  For review of initial/current patient goals, please see plan of care.  Attendees: Patient:     Family:     Physician:  Dr. Dub Mikes 03/30/2013 9:46 AM   Nursing:   Omelia Blackwater, RN 03/30/2013 9:46 AM   Clinical Social Worker:  Reyes Ivan, LCSW 03/30/2013 9:46 AM   Other: Onnie Boer, RN case manager 03/30/2013 9:46 AM   Other:  Trula Slade, LCSWA 03/30/2013 9:46 AM   Other:  Waynetta Sandy, RN 03/30/2013 9:46 AM   Other:     Other:    Other:    Other:    Other:    Other:    Other:     Scribe for Treatment Team:   Carmina Miller, 03/30/2013 , 9:46 AM

## 2013-03-30 NOTE — [facility] Suicide Risk Assessment (Signed)
[facility] Adult Inpatient Family/Significant Other Suicide Prevention Education  Suicide Prevention Education:   Patient Refusal for Family/Significant Other Suicide Prevention Education: The patient has refused to provide written consent for family/significant other to be provided Family/Significant Other Suicide Prevention Education during admission and/or prior to discharge.  Physician notified.  CSW provided suicide prevention information with patient.    The suicide prevention education provided includes the following:  Suicide risk factors  Suicide prevention and interventions  National Suicide Hotline telephone number  [facility] assessment telephone number  [facility] Emergency Assistance 911  [facility] and/or Residential Mobile Crisis Unit telephone number   Angel Golden, [place] 03/30/2013 10:37 AM
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
## 2013-03-30 NOTE — Progress Notes (Signed)
Pt attended AA group 

## 2013-03-31 DIAGNOSIS — F321 Major depressive disorder, single episode, moderate: Secondary | ICD-10-CM

## 2013-03-31 NOTE — BHH Group Notes (Signed)
[facility] Group Notes:  (Nursing/MHT/Case Management/Adjunct)  Date:  03/31/2013  Time:  9:31 AM  Type of Therapy:  [facility] Orientation  Participation Level:  None  Participation Quality:  Attentive  Affect:  Blunted, Depressed and Flat  Cognitive:  Alert and Oriented  Insight:  None  Engagement in Group:  None  Modes of Intervention:  Activity and Discussion  Summary of Progress/Problems: Pt did not share goal and arrived to group at the end. No interaction.  Renaee Munda 03/31/2013, 9:31 AM

## 2013-03-31 NOTE — Progress Notes (Signed)
Recreation Therapy Notes  Date: 11.18.2014 Time: 2:30pm Location: [address] Day[address]   Group Topic: Animal Assisted Activities (AAA)  Behavioral Response: Did not attend.   Aracelie Addis L Donaven Criswell, LRT/CTRS  Revanth Neidig L 03/31/2013 5:05 PM 

## 2013-03-31 NOTE — Progress Notes (Signed)
Patient ID: Angel Golden, male   DOB: [birth year], 52 y.o.   MRN: [id number] Pt visible in the milieu.  Interacting appropriately with staff and peers.  Pt stated he felt good earlier during the day but began to feel depressed after returning from the gym.  Pt stated he was not sure what caused the change in his mood.  Support and encouragement provided.  Needs assessed.  Pt denied.  Fifteen minute checks in progress. Pt safe on unit.

## 2013-03-31 NOTE — Progress Notes (Signed)
Recreation Therapy Notes  Date: 11.18.2014 Time: 3:00pm Location: [address] Day[address]   Group Topic: Communication, Team Building, Problem Solving  Goal Area(s) Addresses:  Patient will effectively work with peer towards shared goal.  Patient will identify skill used to make activity successful.  Patient will identify how skills used during activity can be used to reach post d/c goals.   Behavioral Response: Did not attend.   Hoa Deriso L Andrienne Havener, LRT/CTRS  Esaiah Wanless L 03/31/2013 4:29 PM 

## 2013-03-31 NOTE — Progress Notes (Signed)
Adult Psychoeducational Group Note  Date:  03/31/2013 Time:  11:07 AM  Group Topic/Focus:  Therapeutic activity sharing strengths with the group    Participation Level:  Minimal  Participation Quality:  Attentive  Affect:  Appropriate  Cognitive:  Appropriate  Insight: Good  Engagement in Group:  Lacking  Modes of Intervention:  Activity  Additional Comments:  Pt chose [facility] for unity because this is a quality important to him and he tries to create unity in his life.  Reynolds Bowl 03/31/2013, 11:07 AM

## 2013-03-31 NOTE — BHH Group Notes (Signed)
[facility] LCSW Group Therapy  03/31/2013  1:15 PM   Type of Therapy:  Group Therapy  Participation Level:  Active  Participation Quality:  Appropriate and Attentive  Affect:  Appropriate  Cognitive:  Alert and Appropriate  Insight:  Developing/Improving and Engaged  Engagement in Therapy:  Developing/Improving and Engaged  Modes of Intervention:  Activity, Clarification, Confrontation, Discussion, Education, Exploration, Limit-setting, Orientation, Problem-solving, Rapport Building, Reality Testing, Socialization and Support  Summary of Progress/Problems: Patient was attentive and engaged with speaker from [employer].  Patient was attentive to speaker while they shared their story of dealing with mental health and overcoming it.  Patient expressed interest in their programs and services and received information on their agency.  Patient processed ways they can relate to the speaker.     Kelda Azad Horton, LCSW 03/31/2013 1:26 PM   
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
## 2013-03-31 NOTE — Progress Notes (Signed)
Adult Psychoeducational Group Note  Date:  03/31/2013 Time:  2:04 PM  Group Topic/Focus:  Recovery Goals:   The focus of this group is to identify appropriate goals for recovery and establish a plan to achieve them.  Participation Level:  Minimal  Participation Quality:  Appropriate and Attentive  Affect:  Flat  Cognitive:  Alert and Appropriate  Insight: Good  Engagement in Group:  Engaged  Modes of Intervention:  Activity, Discussion, Exploration, Socialization and Support  Additional Comments:  Pt came to group and shared that fear and pain and being homeless are standing between him and recovery. Pt plans on going to the [place] and attending therapy and going to [employer]   Cathlean Cower 03/31/2013, 2:04 PM

## 2013-03-31 NOTE — Progress Notes (Signed)
Patient ID: Angel Golden, male   DOB: July 18, 1960, 52 y.o.   MRN: [id number]  D: Patient with dull, flat affect endorsing depression. Pt with minimal interaction with staff/peers. Pt arriving to group session at the end and did not participate in discussion. Pt rates his depression at a 4 and his hopelessness at a 4.  A: Q 15 minute safety checks per protocol, encourage staff/peer interaction and group participation. Administer medications as ordered by MD. R: patient pleasant and cooperative but continues to endorse depression. Pt denies SI.

## 2013-03-31 NOTE — Progress Notes (Signed)
Attended AA 

## 2013-04-01 DIAGNOSIS — F1994 Other psychoactive substance use, unspecified with psychoactive substance-induced mood disorder: Secondary | ICD-10-CM

## 2013-04-01 DIAGNOSIS — F1410 Cocaine abuse, uncomplicated: Secondary | ICD-10-CM

## 2013-04-01 MED ORDER — SERTRALINE HCL 25 MG PO TABS: 25.0000 mg | ORAL_TABLET | Freq: Every day | ORAL | Status: DC

## 2013-04-01 MED ORDER — PANTOPRAZOLE SODIUM 40 MG PO TBEC: 40.0000 mg | DELAYED_RELEASE_TABLET | Freq: Every day | ORAL | Status: DC

## 2013-04-01 MED ORDER — TRAZODONE HCL 100 MG PO TABS
100.0000 mg | ORAL_TABLET | Freq: Every evening | ORAL | Status: DC | PRN
Start: 2013-04-01 — End: 2020-05-10

## 2013-04-01 NOTE — Progress Notes (Signed)
Pt was discharged to [facility] today.  He denied any S/I H/I or A/V hallucinations.    He was given f/u appointment, rx, sample medications, hotline info booklet.  He voiced understanding to all instructions provided.  He declined the need for smoking cessation materials.  He removed his nicotine patch before he left.

## 2013-04-01 NOTE — BHH Suicide Risk Assessment (Signed)
Suicide Risk Assessment  Discharge Assessment     Demographic Factors:  Male  Mental Status Per Nursing Assessment::   On Admission:     Current Mental Status by Physician: In full contact with reality. There are no suicidal ideas, plans or intent. His mood is euthymic, his affect is appropriate. There are no active S/S of withdrawal. He is going to [facility] this afternoon to pursue further rehab treatment.    Loss Factors: NA  Historical Factors: NA  Risk Reduction Factors:   wanting to do better  Continued Clinical Symptoms:  Alcohol/Substance Abuse/Dependencies  Cognitive Features That Contribute To Risk:  Closed-mindedness Polarized thinking Thought constriction (tunnel vision)    Suicide Risk:  Minimal: No identifiable suicidal ideation.  Patients presenting with no risk factors but with morbid ruminations; may be classified as minimal risk based on the severity of the depressive symptoms  Discharge Diagnoses:   AXIS I:  Polysubstance Dependence, PTSD, Mood Disorder NOS AXIS II:  Deferred AXIS III:   Past Medical History  Diagnosis Date  . Cirrhosis   . Hepatitis C   . Hypertension   . PTSD (post-traumatic stress disorder)   . Bone spur of other site     rt foot   AXIS IV:  other psychosocial or environmental problems AXIS V:  51-60 moderate symptoms  Plan Of Care/Follow-up recommendations:  Activity:  as tolerated Diet:  regular Follow up [facility] Is patient on multiple antipsychotic therapies at discharge:  No   Has Patient had three or more failed trials of antipsychotic monotherapy by history:  No  Recommended Plan for Multiple Antipsychotic Therapies: NA  Eleah Lahaie A 04/01/2013, 2:25 PM

## 2013-04-01 NOTE — Progress Notes (Signed)
Pt resting in bed, eyes closed, breathing even and unlabored. Q15 min safety checks maintained. Pt remains safe on the unit.

## 2013-04-01 NOTE — BHH Group Notes (Signed)
[facility] LCSW Aftercare Discharge Planning Group Note   04/01/2013 8:45 AM  Participation Quality:  Alert and Appropriate   Mood/Affect:  Appropriate, Flat and Depressed  Depression Rating:  4  Anxiety Rating:  4  Thoughts of Suicide:  Pt denies SI/HI  Will you contract for safety?   Yes  Current AVH:  Pt denies  Plan for Discharge/Comments:  Pt attended discharge planning group and actively participated in group.  CSW provided pt with today's workbook.  Pt states that he is doing "alright" today.  Pt states that he wants to get to the [facility] for further inpatient treatment.  CSW explained that it would be hard for pt to get into the [place] from here.  Pt is open to going to [facility] for further inpatient treatment.  CSW called for a bed today but no beds available today; possibly tomorrow.  CSW will call [facility] tomorrow.  Pt can also follow up at [facility] for outpatient treatment.  No further needs voiced by pt at this time.    Transportation Means: Pt reports access to transportation  Supports: No supports mentioned at this time  Reyes Ivan, LCSW 04/01/2013 9:39 AM

## 2013-04-01 NOTE — BHH Group Notes (Signed)
[facility] LCSW Group Therapy  04/01/2013  1:15 PM   Type of Therapy:  Group Therapy  Participation Level:  Active  Participation Quality:  Attentive and Supportive  Affect:  Appropriate and Calm  Cognitive:  Alert and Oriented  Insight:  Developing/Improving and Engaged  Engagement in Therapy:  Developing/Improving  Modes of Intervention:  Clarification, Confrontation, Discussion, Education, Exploration, Limit-setting, Orientation, Problem-solving, Rapport Building, Dance movement psychotherapist, Socialization and Support  Summary of Progress/Problems: The topic for group today was emotional regulation.  This group focused on both positive and negative emotion identification and allowed group members to process ways to identify feelings, regulate negative emotions, and find healthy ways to manage internal/external emotions. Group members were asked to reflect on a time when their reaction to an emotion led to a negative outcome and explored how alternative responses using emotion regulation would have benefited them. Group members were also asked to discuss a time when emotion regulation was utilized when a negative emotion was experienced.  Pt shared that he is dealing with regret from the mistakes he's made in the past and feeling like he chose the wrong path.  Pt states that he thought he had control over his life but was wrong.  Pt states that because of his past and regret he acts on this emotion by lashing out on others.  Pt states that he is working on ways to be more positive and states that he takes positive traits from different people he comes across.  Pt actively participated and was engaged in this group discussion.    Reyes Ivan, LCSW 04/01/2013 2:25 PM

## 2013-04-01 NOTE — Tx Team (Signed)
Interdisciplinary Treatment Plan Update (Adult)  Date: 04/01/2013  Time Reviewed:  9:45 AM  Progress in Treatment: Attending groups: Yes Participating in groups:  Yes Taking medication as prescribed:  Yes Tolerating medication:  Yes Family/Significant othe contact made: No, pt refused Patient understands diagnosis:  Yes Discussing patient identified problems/goals with staff:  Yes Medical problems stabilized or resolved:  Yes Denies suicidal/homicidal ideation: Yes Issues/concerns per patient self-inventory:  Yes Other:  New problem(s) identified: N/A  Discharge Plan or Barriers: CSW assessing for appropriate referrals.  Pt wants to go for further inpatient treatment, possibly [facility] tomorrow.    Reason for Continuation of Hospitalization: Anxiety Depression Medication Stabilization  Comments: N/A  Estimated length of stay: 2-3 days  For review of initial/current patient goals, please see plan of care.  Attendees: Patient:     Family:     Physician:  Dr. Dub Mikes 04/01/2013 10:18 AM   Nursing:   Roswell Miners, RN 04/01/2013 10:18 AM   Clinical Social Worker:  Reyes Ivan, LCSW 04/01/2013 10:18 AM   Other: Onnie Boer, RN case manager 04/01/2013 10:18 AM   Other:  Trula Slade, LCSWA 04/01/2013 10:18 AM   Other:  Robbie Louis, RN 04/01/2013 10:18 AM   Other:  Dellia Cloud, RN 04/01/2013 10:19 AM   Other:    Other:    Other:    Other:    Other:    Other:     Scribe for Treatment Team:   Carmina Miller, 04/01/2013 , 10:18 AM

## 2013-04-01 NOTE — Progress Notes (Signed)
[facility] Adult Case Management Discharge Plan :  Will you be returning to the same living situation after discharge: Yes,  can return home after treatment At discharge, do you have transportation home?:Yes,  [facility] will pick pt up today Do you have the ability to pay for your medications:Yes,  access to meds  Release of information consent forms completed and in the chart;  Patient's signature needed at discharge.  Patient to Follow up at: Follow-up Information   Follow up with [facility] On 04/01/2013. (They will pick you up today at 2:45 pm, for further inpatient treatment.  )    Contact information:   [address]  [place], [place] [address] Phone: [phone] Fax: [phone]      Follow up with [facility]. (Walk in on this date for hospital discharge appointment. Walk in clinic is Monday - Friday 8 am - 3 pm. They will then schedule you for medication management and therapy.)    Contact information:   [address], [place] [address] Phone: [phone] Fax: [phone]      Patient denies SI/HI:   Yes,  denies SI/HI    Safety Planning and Suicide Prevention discussed:  Yes,  discussed with pt, pt refused consent to provide to family/friend.  See suicide prevention education note.   Carmina Miller 04/01/2013, 2:22 PM

## 2013-04-01 NOTE — Progress Notes (Signed)
Adult Psychoeducational Group Note  Date:  04/01/2013 Time:  1:43 PM  Group Topic/Focus:  Personal Choices and Values:   The focus of this group is to help patients assess and explore the importance of values in their lives, how their values affect their decisions, how they express their values and what opposes their expression.  Participation Level:  Did Not Attend  Additional Comments:  Pt was encouraged to come to group but pt stayed in bed.   Cathlean Cower 04/01/2013, 1:43 PM
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
## 2013-04-06 NOTE — Progress Notes (Signed)
Patient Discharge Instructions:  After Visit Summary (AVS):   Faxed to:  04/06/13 Discharge Summary Note:   Faxed to:  04/06/13 Psychiatric Admission Assessment Note:   Faxed to:  04/06/13 Suicide Risk Assessment - Discharge Assessment:   Faxed to:  04/06/13 Faxed/Sent to the Next Level Care provider:  04/06/13 Faxed to [facility] @ [phone] Faxed to [facility] @ [phone]  Jerelene Redden, 04/06/2013, 1:49 PM

## 2014-10-04 ENCOUNTER — Encounter (HOSPITAL_COMMUNITY): Payer: Self-pay

## 2014-10-04 ENCOUNTER — Emergency Department (HOSPITAL_COMMUNITY): Payer: Self-pay

## 2014-10-04 ENCOUNTER — Emergency Department (HOSPITAL_COMMUNITY)
Admission: EM | Admit: 2014-10-04 | Discharge: 2014-10-04 | Disposition: A | Discharge status: Home / Self Care | Payer: Self-pay | Attending: Emergency Medicine | Admitting: Emergency Medicine

## 2014-10-04 DIAGNOSIS — Z8739 Personal history of other diseases of the musculoskeletal system and connective tissue: Secondary | ICD-10-CM | POA: Insufficient documentation

## 2014-10-04 DIAGNOSIS — Z8719 Personal history of other diseases of the digestive system: Secondary | ICD-10-CM | POA: Insufficient documentation

## 2014-10-04 DIAGNOSIS — W109XXA Fall (on) (from) unspecified stairs and steps, initial encounter: Secondary | ICD-10-CM | POA: Insufficient documentation

## 2014-10-04 DIAGNOSIS — Y999 Unspecified external cause status: Secondary | ICD-10-CM | POA: Insufficient documentation

## 2014-10-04 DIAGNOSIS — S0001XA Abrasion of scalp, initial encounter: Secondary | ICD-10-CM | POA: Insufficient documentation

## 2014-10-04 DIAGNOSIS — Y939 Activity, unspecified: Secondary | ICD-10-CM | POA: Insufficient documentation

## 2014-10-04 DIAGNOSIS — Y92009 Unspecified place in unspecified non-institutional (private) residence as the place of occurrence of the external cause: Secondary | ICD-10-CM | POA: Insufficient documentation

## 2014-10-04 DIAGNOSIS — Z8619 Personal history of other infectious and parasitic diseases: Secondary | ICD-10-CM | POA: Insufficient documentation

## 2014-10-04 DIAGNOSIS — Z720 Tobacco use: Secondary | ICD-10-CM | POA: Insufficient documentation

## 2014-10-04 DIAGNOSIS — Z79899 Other long term (current) drug therapy: Secondary | ICD-10-CM | POA: Insufficient documentation

## 2014-10-04 DIAGNOSIS — S2231XA Fracture of one rib, right side, initial encounter for closed fracture: Secondary | ICD-10-CM | POA: Insufficient documentation

## 2014-10-04 DIAGNOSIS — F4310 Post-traumatic stress disorder, unspecified: Secondary | ICD-10-CM | POA: Insufficient documentation

## 2014-10-04 DIAGNOSIS — I10 Essential (primary) hypertension: Secondary | ICD-10-CM | POA: Insufficient documentation

## 2014-10-04 DIAGNOSIS — W19XXXA Unspecified fall, initial encounter: Secondary | ICD-10-CM

## 2014-10-04 DIAGNOSIS — S199XXA Unspecified injury of neck, initial encounter: Secondary | ICD-10-CM | POA: Insufficient documentation

## 2014-10-04 MED ORDER — HYDROCODONE-ACETAMINOPHEN 5-325 MG PO TABS
1.0000 | ORAL_TABLET | Freq: Once | ORAL | Status: AC
  Administered 2014-10-04: 1 via ORAL
  Filled 2014-10-04: qty 1

## 2014-10-04 MED ORDER — HYDROCODONE-ACETAMINOPHEN 5-325 MG PO TABS: 2.0000 | ORAL_TABLET | ORAL | Status: DC | PRN

## 2014-10-04 MED ORDER — METHOCARBAMOL 500 MG PO TABS: 500.0000 mg | ORAL_TABLET | Freq: Two times a day (BID) | ORAL | Status: DC

## 2014-10-04 MED ORDER — METHOCARBAMOL 500 MG PO TABS
1000.0000 mg | ORAL_TABLET | Freq: Once | ORAL | Status: AC
Start: 2014-10-04 — End: 2014-10-04
  Administered 2014-10-04: 1000 mg via ORAL
  Filled 2014-10-04: qty 2

## 2014-10-04 NOTE — ED Notes (Signed)
Pt offered ice pack, pt states he does not want an ice pack he wants pain medication

## 2014-10-04 NOTE — ED Notes (Signed)
Bed: [id number] Expected date:  Expected time:  Means of arrival:  Comments: EMS 75M 10' fall

## 2014-10-04 NOTE — ED Notes (Signed)
Pt has pain upon palpation of thoracic and cervical areas of his back.

## 2014-10-04 NOTE — ED Notes (Signed)
Ice pack applied to R side of head  Incentive spirometer given to pt, got 2500, pt did 10 times.

## 2014-10-04 NOTE — ED Provider Notes (Signed)
CSN: [id number]     Arrival date & time 10/04/14  0054 History   First MD Initiated Contact with Patient 10/04/14 0200     Chief Complaint  Patient presents with  . Fall     (Consider location/radiation/quality/duration/timing/severity/associated sxs/prior Treatment) HPI 54 year old male presents to emergency department from home via EMS after a fall.  Patient has had difficulties going up and down stairs recently.  Sister attributes this to medications and alcohol tonight.   He stumbled coming down the stairs and fell 7 feet, 8 stairs down.  He is complaining of pain to the right side of his head where he has an abrasion and neck pain.  No LOC.  No vomiting.  No other injuries Past Medical History  Diagnosis Date  . Cirrhosis   . Hepatitis C   . Hypertension   . PTSD (post-traumatic stress disorder)   . Bone spur of other site     rt foot   History reviewed. No pertinent past surgical history. No family history on file. History  Substance Use Topics  . Smoking status: Current Some Day Smoker    Types: Cigarettes  . Smokeless tobacco: Not on file  . Alcohol Use: Yes     Comment: occasionally     Review of Systems  See History of Present Illness; otherwise all other systems are reviewed and negative   Allergies  Review of patient's allergies indicates no known allergies.  Home Medications   Prior to Admission medications   Medication Sig Start Date End Date Taking? Authorizing Provider  pantoprazole (PROTONIX) 40 MG tablet Take 1 tablet (40 mg total) by mouth daily. For stomach 04/01/13  Yes Rachael Fee, MD  traZODone (DESYREL) 100 MG tablet Take 1 tablet (100 mg total) by mouth at bedtime as needed for sleep. 04/01/13  Yes Rachael Fee, MD  sertraline (ZOLOFT) 25 MG tablet Take 1 tablet (25 mg total) by mouth daily. For depression/anxiety 04/01/13   Rachael Fee, MD   BP 124/91 mmHg  Pulse 77  Temp(Src) 98.6 F (37 C) (Oral)  Resp 18  SpO2 96% Physical Exam   Constitutional: He is oriented to person, place, and time. He appears well-developed and well-nourished.  HENT:  Head: Normocephalic.  Nose: Nose normal.  Mouth/Throat: Oropharynx is clear and moist.  Patient has 4 cm abrasion to right parietal scalp  Eyes: Conjunctivae and EOM are normal. Pupils are equal, round, and reactive to light.  Neck: Normal range of motion. Neck supple. No JVD present. No tracheal deviation present. No thyromegaly present.  Cardiovascular: Normal rate, regular rhythm, normal heart sounds and intact distal pulses.  Exam reveals no gallop and no friction rub.   No murmur heard. Pulmonary/Chest: Effort normal and breath sounds normal. No stridor. No respiratory distress. He has no wheezes. He has no rales. He exhibits no tenderness.  Abdominal: Soft. Bowel sounds are normal. He exhibits no distension and no mass. There is no tenderness. There is no rebound and no guarding.  Musculoskeletal: Normal range of motion. He exhibits no edema or tenderness.  Lymphadenopathy:    He has no cervical adenopathy.  Neurological: He is alert and oriented to person, place, and time. He displays normal reflexes. He exhibits normal muscle tone. Coordination normal.  Skin: Skin is warm and dry. No rash noted. No erythema. No pallor.  Psychiatric: He has a normal mood and affect. His behavior is normal. Judgment and thought content normal.  Nursing note and vitals reviewed.  ED Course  Procedures (including critical care time) Labs Review Labs Reviewed - No data to display  Imaging Review Dg Ribs Unilateral W/chest Right  10/04/2014   CLINICAL DATA:  Right anterior rib pain radiating to the axilla. Fall. Initial encounter.  EXAM: RIGHT RIBS AND CHEST - 3+ VIEW  COMPARISON:  08/23/2003 chest x-ray  FINDINGS: Nondisplaced fracture of the lateral right eighth rib. No evidence of hemothorax, pneumothorax, lung contusion. Normal heart size and mediastinal contours.  IMPRESSION: 1.  Nondisplaced right eighth rib fracture. 2. No evidence of acute intrathoracic disease.   Electronically Signed   By: Marnee SpringJonathon  Watts M.D.   On: 10/04/2014 04:26   Ct Head Wo Contrast  10/04/2014   CLINICAL DATA:  Fall down steps with loss of consciousness.  EXAM: CT HEAD WITHOUT CONTRAST  CT CERVICAL SPINE WITHOUT CONTRAST  TECHNIQUE: Multidetector CT imaging of the head and cervical spine was performed following the standard protocol without intravenous contrast. Multiplanar CT image reconstructions of the cervical spine were also generated.  COMPARISON:  Cervical spine CT 08/22/2003  FINDINGS: CT HEAD FINDINGS  Skull and Sinuses:Negative for fracture or destructive process. The mastoids, middle ears, and imaged paranasal sinuses are clear.  Orbits: No acute abnormality.  Brain: No evidence of acute infarction, hemorrhage, hydrocephalus, or mass lesion/mass effect. 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  CT CERVICAL SPINE FINDINGS  No evidence of acute fracture or traumatic malalignment. Degenerative disc narrowing with endplate spurring at C3-4, C4-5, and C5-6. Bony overgrowth causes diffuse mild to moderate canal stenosis. No gross cervical canal hematoma.  Bulky lamellated stones in the left submandibular gland, with glandular atrophy.  IMPRESSION: 1. No evidence of acute intracranial or cervical spine injury. 2. 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 3. Large left submandibular stones with glandular atrophy.   Electronically Signed   By: Marnee SpringJonathon  Watts M.D.   On: 10/04/2014 03:33   Ct Cervical Spine Wo Contrast  10/04/2014   CLINICAL DATA:  Fall down steps with loss of consciousness.  EXAM: CT HEAD WITHOUT CONTRAST  CT CERVICAL SPINE WITHOUT CONTRAST  TECHNIQUE: Multidetector CT imaging of the head and cervical spine was performed following the standard protocol without intravenous contrast. Multiplanar CT image reconstructions of the  cervical spine were also generated.  COMPARISON:  Cervical spine CT 08/22/2003  FINDINGS: CT HEAD FINDINGS  Skull and Sinuses:Negative for fracture or destructive process. The mastoids, middle ears, and imaged paranasal sinuses are clear.  Orbits: No acute abnormality.  Brain: No evidence of acute infarction, hemorrhage, hydrocephalus, or mass lesion/mass effect. 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  CT CERVICAL SPINE FINDINGS  No evidence of acute fracture or traumatic malalignment. Degenerative disc narrowing with endplate spurring at C3-4, C4-5, and C5-6. Bony overgrowth causes diffuse mild to moderate canal stenosis. No gross cervical canal hematoma.  Bulky lamellated stones in the left submandibular gland, with glandular atrophy.  IMPRESSION: 1. No evidence of acute intracranial or cervical spine injury. 2. 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 3. Large left submandibular stones with glandular atrophy.   Electronically Signed   By: Marnee SpringJonathon  Watts M.D.   On: 10/04/2014 03:33     EKG Interpretation None      MDM   Final diagnoses:  Scalp abrasion, initial encounter  Fall at home, initial encounter  Right rib fracture, closed, initial encounter   54 year old male status post fall down stairs.  Plan for head and C-spine CT scan.  CT head/spine normal.  Pt c/o right scapular/rib pain.  Xrays show nondisplaced 8th rib fracture.    Marisa Severin, MD 10/04/14 [phone]

## 2014-10-04 NOTE — ED Notes (Signed)
Pt presents from sister's home via EMS with c/o fall. Per EMS, pt was coming down the steps and fell approx 8 steps from the bottom, approx 7 feet. Pt is c/o head and neck pain. Pt reports he did hit his head, no LOC. Pt has an abrasion to the top of his head, no other deformities noted. Pt reports abdominal pain upon palpation as well. VS for EMS 120/88, HR 70, R 18. C-collar in place and pt is on LSB.

## 2014-10-04 NOTE — Discharge Instructions (Signed)
Fall Prevention and Home Safety Falls cause injuries and can affect all age groups. It is possible to prevent falls.  HOW TO PREVENT FALLS  Wear shoes with rubber soles that do not have an opening for your toes.  Keep the inside and outside of your house well lit.  Use night lights throughout your home.  Remove clutter from floors.  Clean up floor spills.  Remove throw rugs or fasten them to the floor with carpet tape.  Do not place electrical cords across pathways.  Put grab bars by your tub, shower, and toilet. Do not use towel bars as grab bars.  Put handrails on both sides of the stairway. Fix loose handrails.  Do not climb on stools or stepladders, if possible.  Do not wax your floors.  Repair uneven or unsafe sidewalks, walkways, or stairs.  Keep items you use a lot within reach.  Be aware of pets.  Keep emergency numbers next to the telephone.  Put smoke detectors in your home and near bedrooms. Ask your doctor what other things you can do to prevent falls. Document Released: 02/24/2009 Document Revised: 10/30/2011 Document Reviewed: 07/31/2011 [facility] Patient Information 2015 [place], [place]. This information is not intended to replace advice given to you by your health care provider. Make sure you discuss any questions you have with your health care provider.  Rib Fracture A rib fracture is a break or crack in one of the bones of the ribs. The ribs are like a cage that goes around your upper chest. A broken or cracked rib is often painful, but most do not cause other problems. Most rib fractures heal on their own in 1-3 months. HOME CARE  Avoid activities that cause pain to the injured area. Protect your injured area.  Slowly increase activity as told by your doctor.  Take medicine as told by your doctor.  Put ice on the injured area for the first 1-2 days after you have been treated or as told by your doctor.  Put ice in a plastic bag.  Place a towel  between your skin and the bag.  Leave the ice on for 15-20 minutes at a time, every 2 hours while you are awake.  Do deep breathing as told by your doctor. You may be told to:  Take deep breaths many times a day.  Cough many times a day while hugging a pillow.  Use a device (incentive spirometer) to perform deep breathing many times a day.  Drink enough fluids to keep your pee (urine) clear or pale yellow.   Do not wear a rib belt or binder. These do not allow you to breathe deeply. GET HELP RIGHT AWAY IF:   You have a fever.  You have trouble breathing.   You cannot stop coughing.  You cough up thick or bloody spit (mucus).   You feel sick to your stomach (nauseous), throw up (vomit), or have belly (abdominal) pain.   Your pain gets worse and medicine does not help.  MAKE SURE YOU:   Understand these instructions.  Will watch your condition.  Will get help right away if you are not doing well or get worse. Document Released: 02/07/2008 Document Revised: 08/25/2012 Document Reviewed: 07/02/2012 [facility] Patient Information 2015 [place], [place]. This information is not intended to replace advice given to you by your health care provider. Make sure you discuss any questions you have with your health care provider.  Abrasion An abrasion is a cut or scrape of the  skin. Abrasions do not extend through all layers of the skin and most heal within 10 days. It is important to care for your abrasion properly to prevent infection. CAUSES  Most abrasions are caused by falling on, or gliding across, the ground or other surface. When your skin rubs on something, the outer and inner layer of skin rubs off, causing an abrasion. DIAGNOSIS  Your caregiver will be able to diagnose an abrasion during a physical exam.  TREATMENT  Your treatment depends on how large and deep the abrasion is. Generally, your abrasion will be cleaned with water and a mild soap to remove any dirt or debris.  An antibiotic ointment may be put over the abrasion to prevent an infection. A bandage (dressing) may be wrapped around the abrasion to keep it from getting dirty.  You may need a tetanus shot if:  You cannot remember when you had your last tetanus shot.  You have never had a tetanus shot.  The injury broke your skin. If you get a tetanus shot, your arm may swell, get red, and feel warm to the touch. This is common and not a problem. If you need a tetanus shot and you choose not to have one, there is a rare chance of getting tetanus. Sickness from tetanus can be serious.  HOME CARE INSTRUCTIONS   If a dressing was applied, change it at least once a day or as directed by your caregiver. If the bandage sticks, soak it off with warm water.   Wash the area with water and a mild soap to remove all the ointment 2 times a day. Rinse off the soap and pat the area dry with a clean towel.   Reapply any ointment as directed by your caregiver. This will help prevent infection and keep the bandage from sticking. Use gauze over the wound and under the dressing to help keep the bandage from sticking.   Change your dressing right away if it becomes wet or dirty.   Only take over-the-counter or prescription medicines for pain, discomfort, or fever as directed by your caregiver.   Follow up with your caregiver within 24-48 hours for a wound check, or as directed. If you were not given a wound-check appointment, look closely at your abrasion for redness, swelling, or pus. These are signs of infection. SEEK IMMEDIATE MEDICAL CARE IF:   You have increasing pain in the wound.   You have redness, swelling, or tenderness around the wound.   You have pus coming from the wound.   You have a fever or persistent symptoms for more than 2-3 days.  You have a fever and your symptoms suddenly get worse.  You have a bad smell coming from the wound or dressing.  MAKE SURE YOU:   Understand these  instructions.  Will watch your condition.  Will get help right away if you are not doing well or get worse. Document Released: 02/07/2005 Document Revised: 04/16/2012 Document Reviewed: 04/03/2011 [facility] Patient Information 2015 [place], [place]. This information is not intended to replace advice given to you by your health care provider. Make sure you discuss any questions you have with your health care provider.

## 2016-04-20 IMAGING — CT CT HEAD W/O CM
2 of 6 series · 12 of 47 positions shown, 15 images · non-contrast
Comparison: Cervical spine CT 08/22/2003

CLINICAL DATA: Fall down steps with loss of consciousness.

EXAM:
CT HEAD WITHOUT CONTRAST
CT CERVICAL SPINE WITHOUT CONTRAST
TECHNIQUE: Multidetector CT imaging of the head and cervical spine was
performed following the standard protocol without intravenous
contrast. Multiplanar CT image reconstructions of the cervical spine
were also generated.

[Series 7: axial recon · axial · 0.19mm/px · z∈[+1292,+1423]mm · 9 of 90 slices shown, 12 images]
[im 9/90  brain]
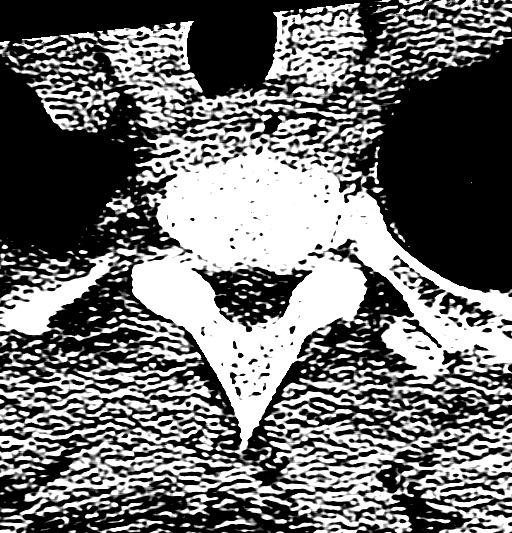
[im 9/90  bone]
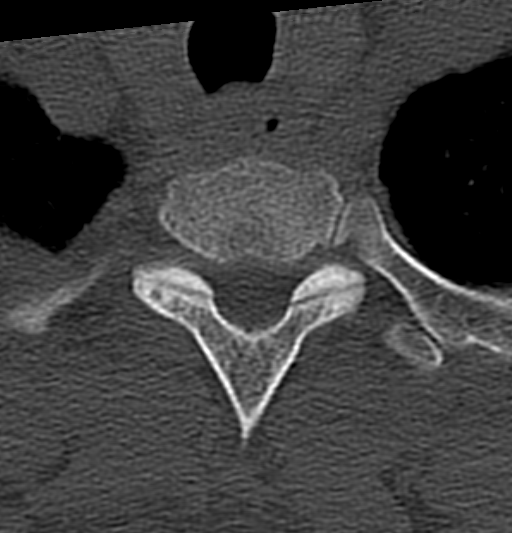
[im 18/90  brain]
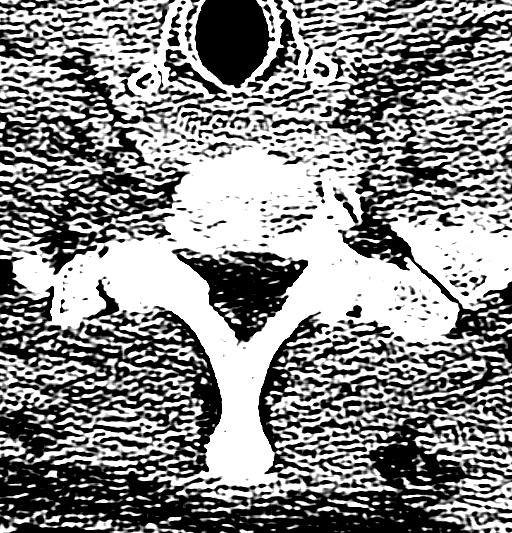
[im 27/90  brain]
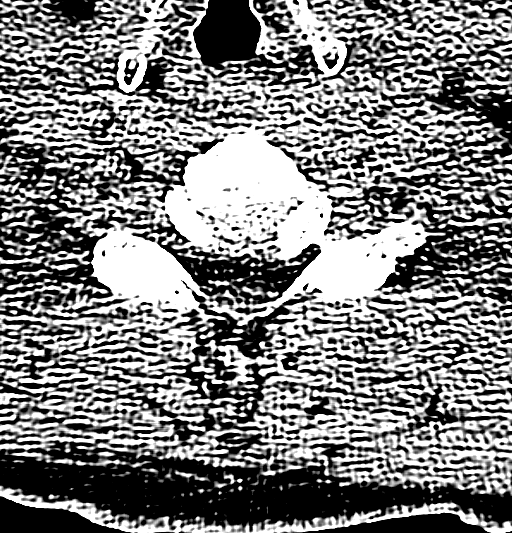
[im 36/90  brain]
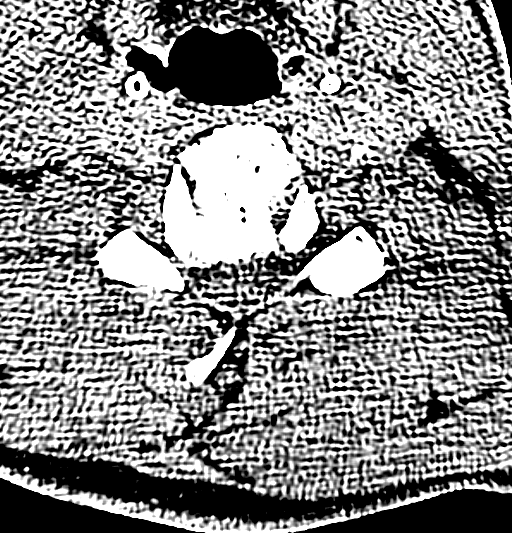
[im 45/90  brain]
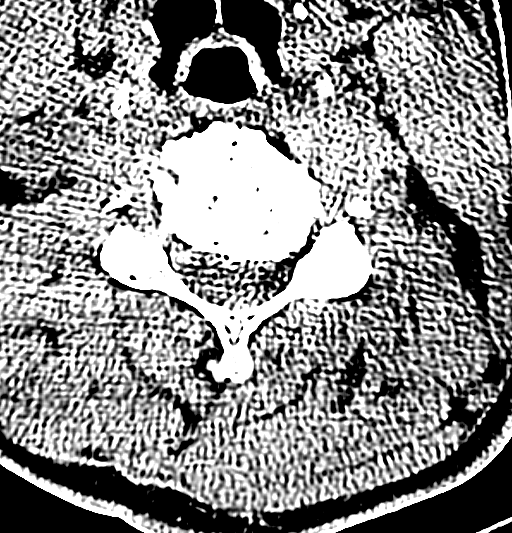
[im 45/90  bone]
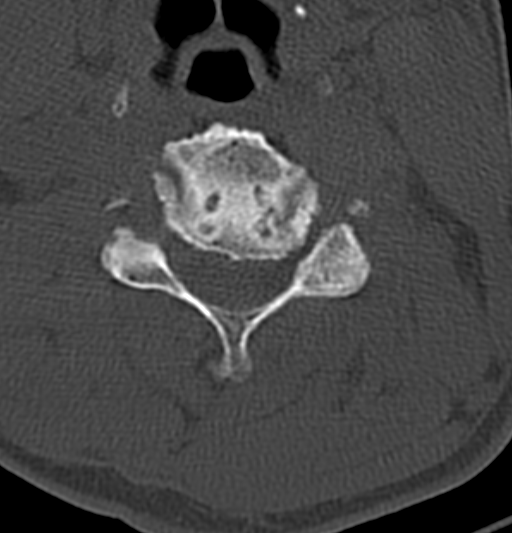
[im 54/90  brain]
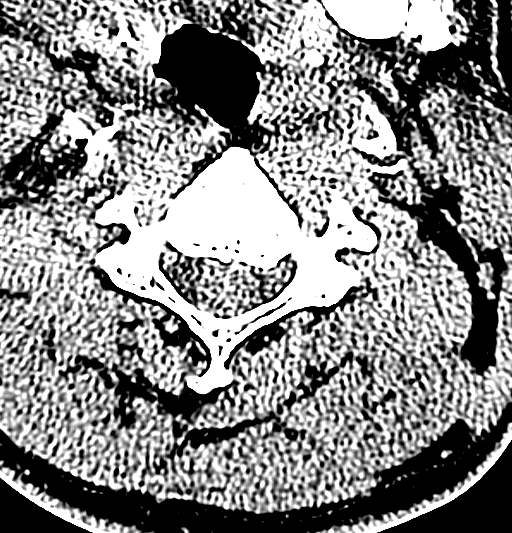
[im 63/90  brain]
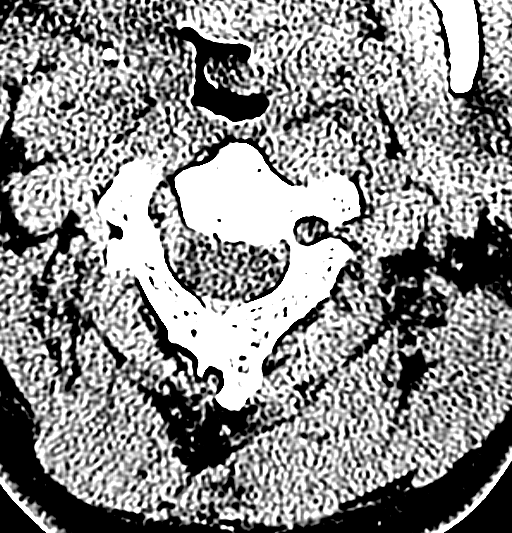
[im 72/90  brain]
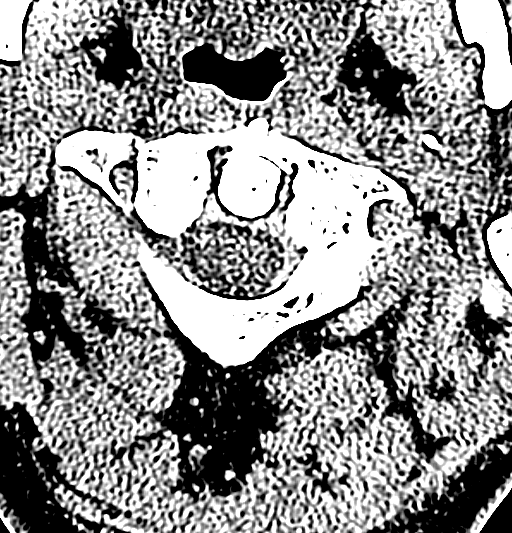
[im 81/90  brain]
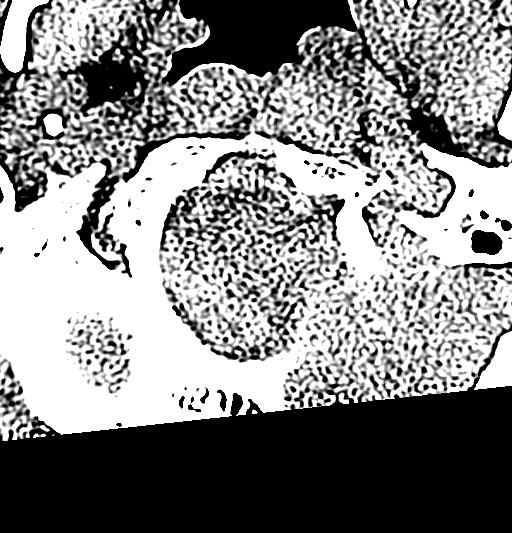
[im 81/90  bone]
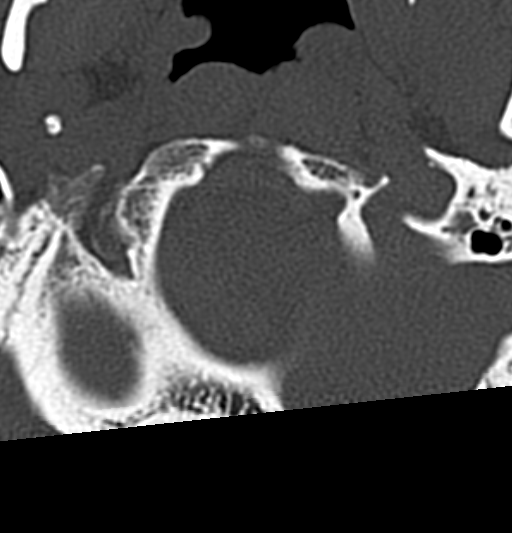

[Series 8: coronal · coronal · 0.19mm/px · 3 of 43 slices shown]
[im 15/43  brain]
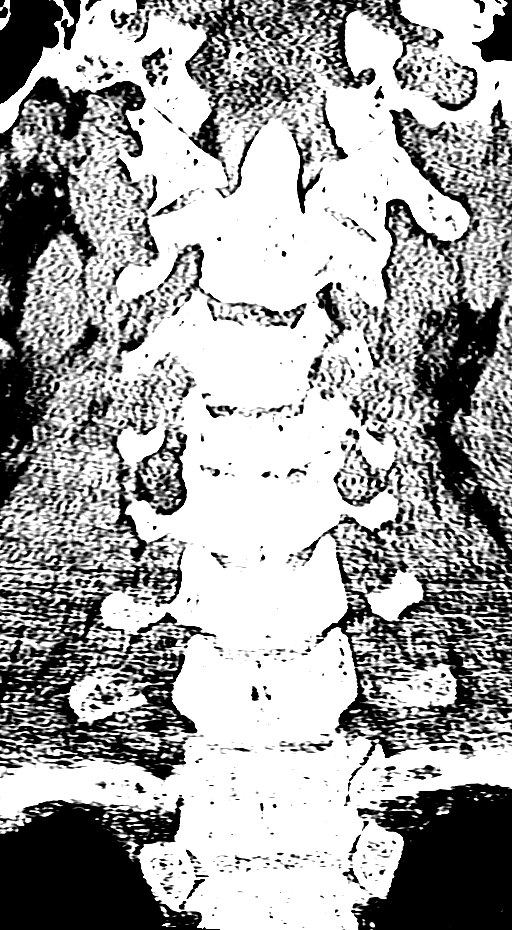
[im 19/43  brain]
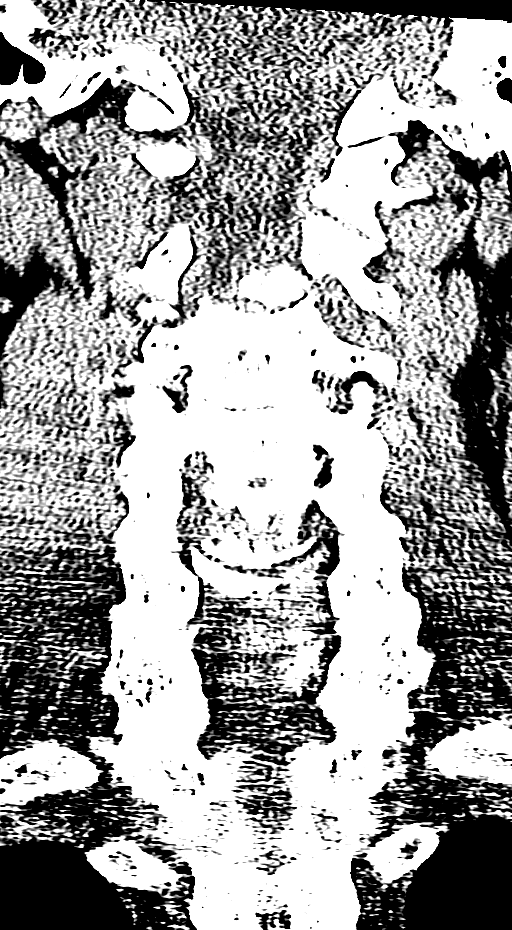
[im 24/43  brain]
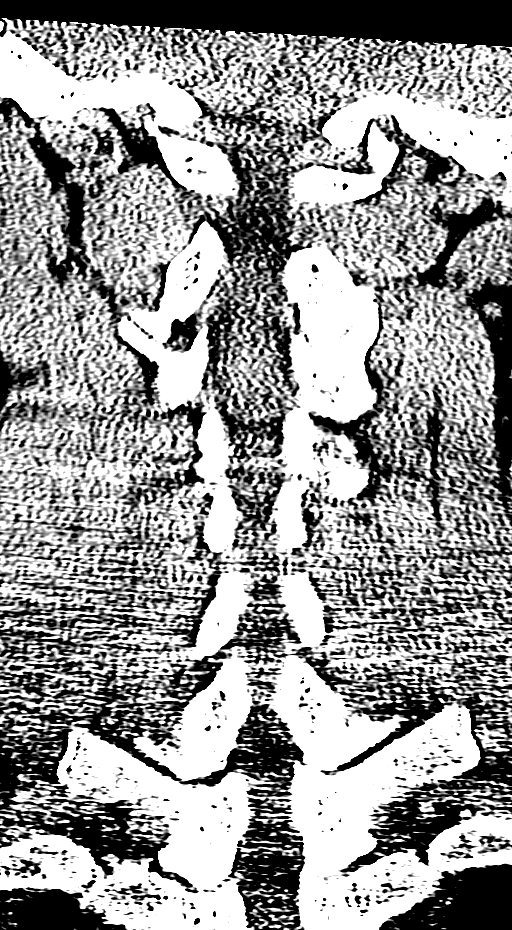

[12 of 47 positions shown; findings below may reference images not displayed]

FINDINGS: CT HEAD FINDINGS

Skull and Sinuses:Negative for fracture or destructive process. The
mastoids, middle ears, and imaged paranasal sinuses are clear.

Orbits: No acute abnormality.

Brain: No evidence of acute infarction, hemorrhage, hydrocephalus,
or mass lesion/mass effect. Peripheral pineal calcification with
central 11mm low-density, most consistent with a pineal cyst.

CT CERVICAL SPINE FINDINGS

No evidence of acute fracture or traumatic malalignment.
Degenerative disc narrowing with endplate spurring at C3-4, C4-5,
and C5-6. Bony overgrowth causes diffuse mild to moderate canal
stenosis. No gross cervical canal hematoma.

Bulky lamellated stones in the left submandibular gland, with
glandular atrophy.
IMPRESSION: 1. No evidence of acute intracranial or cervical spine injury.
2. Pineal enlargement consistent with 12 mm cyst.
3. Large left submandibular stones with glandular atrophy.

## 2016-04-20 IMAGING — CR DG RIBS W/ CHEST 3+V*R*
5 series · 5 of 5 positions shown · non-contrast
Comparison: 08/23/2003 chest x-ray

CLINICAL DATA: Right anterior rib pain radiating to the axilla.
Fall. Initial encounter.

EXAM:
RIGHT RIBS AND CHEST - 3+ VIEW

[w chest pa]
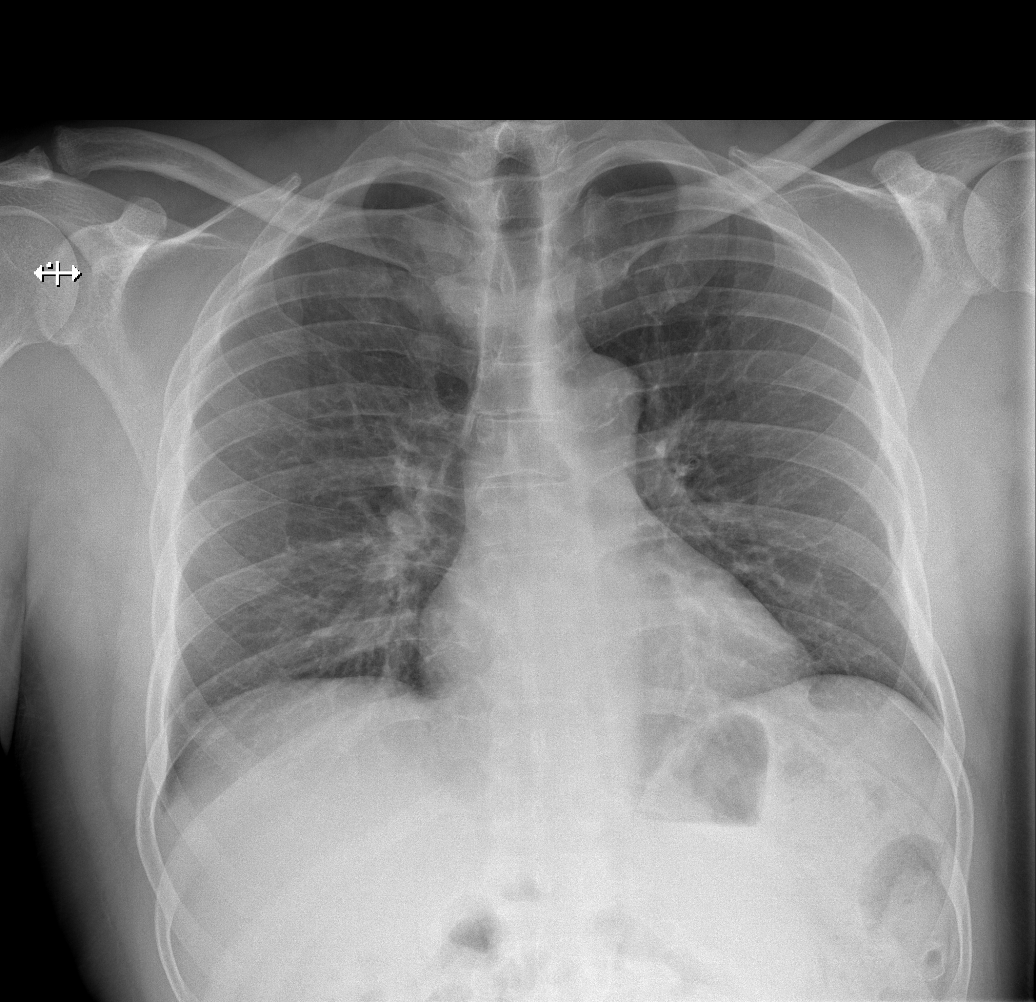

[w ribs ap upper right]
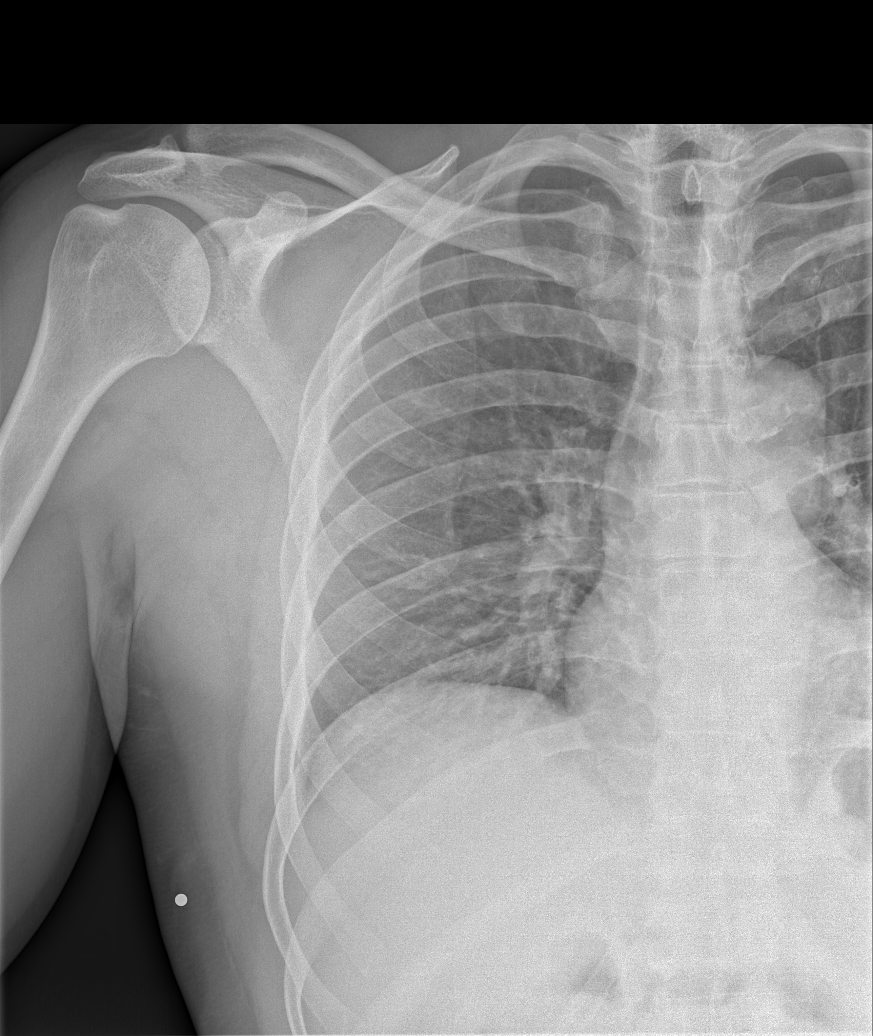

[w ribs ap lower right]
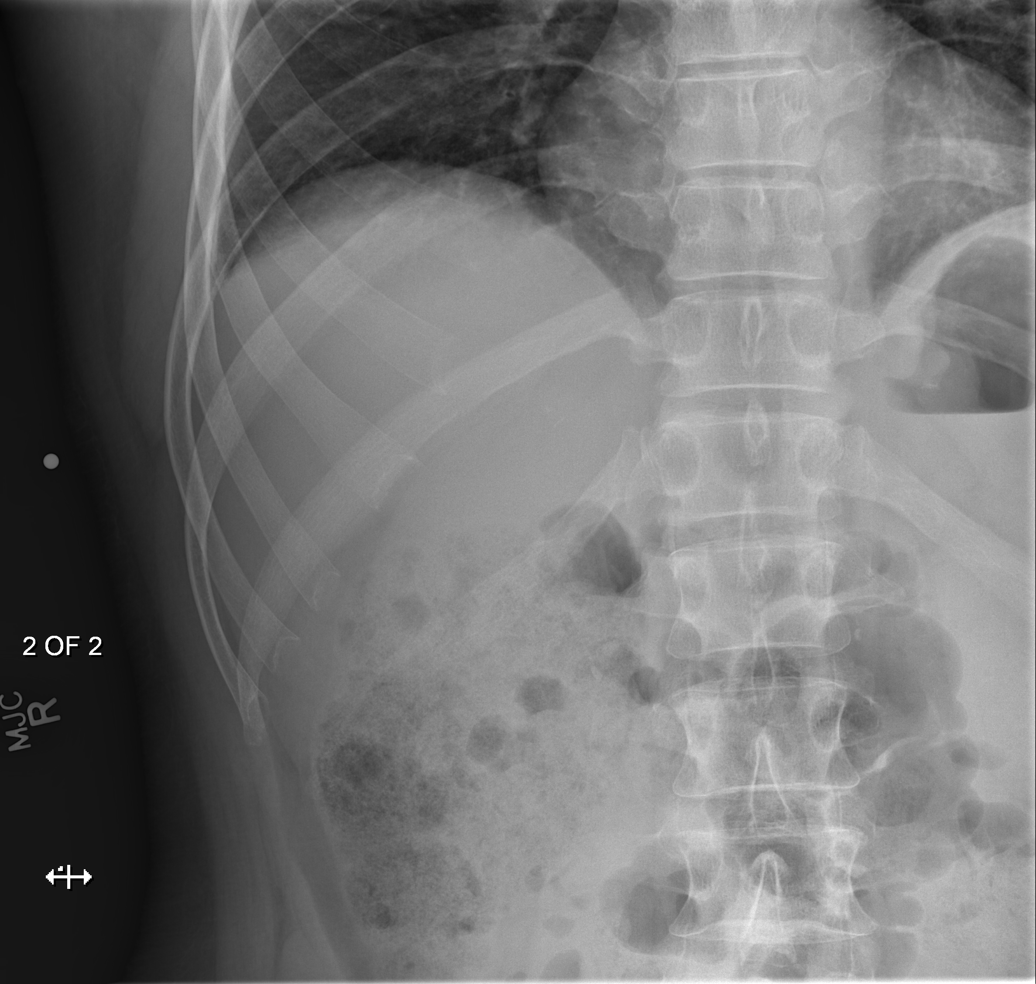

[w ribs obl right (1 of 2)]
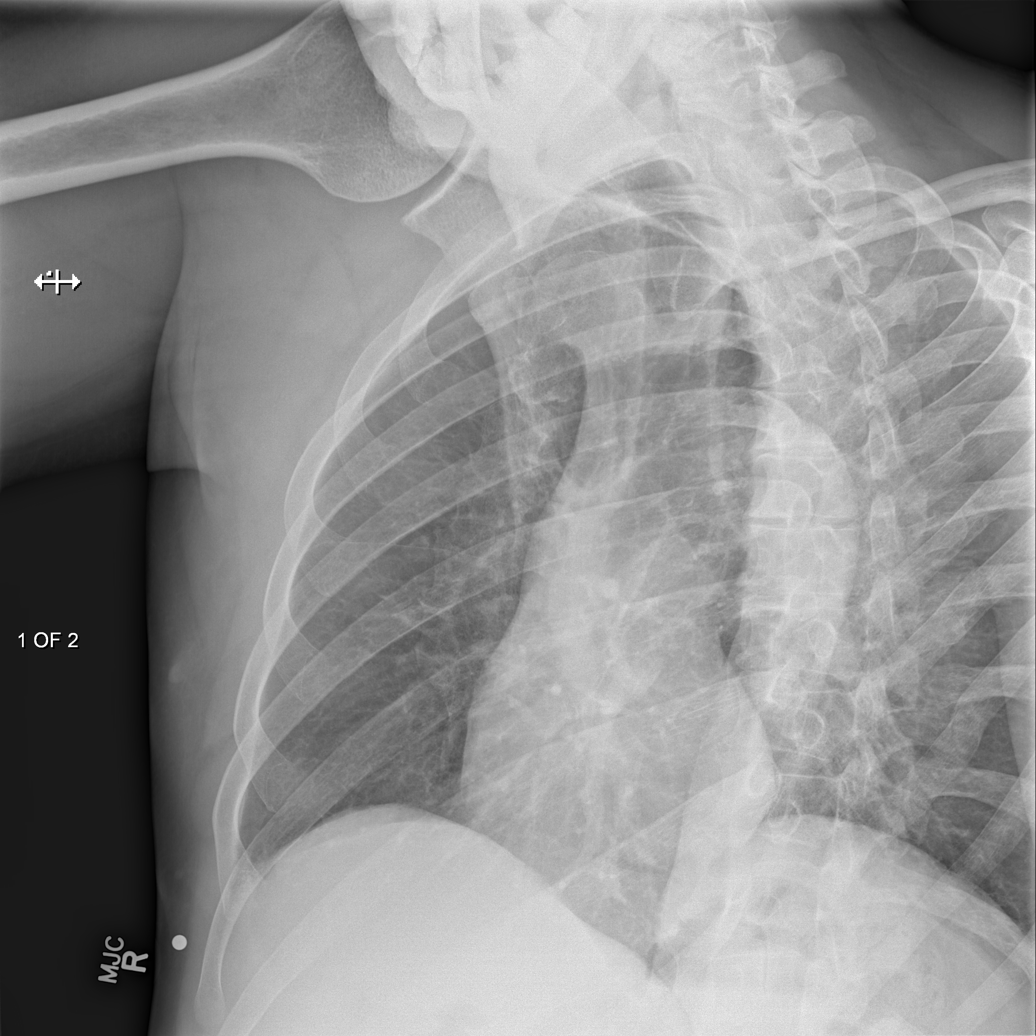

[w ribs obl right (2 of 2)]
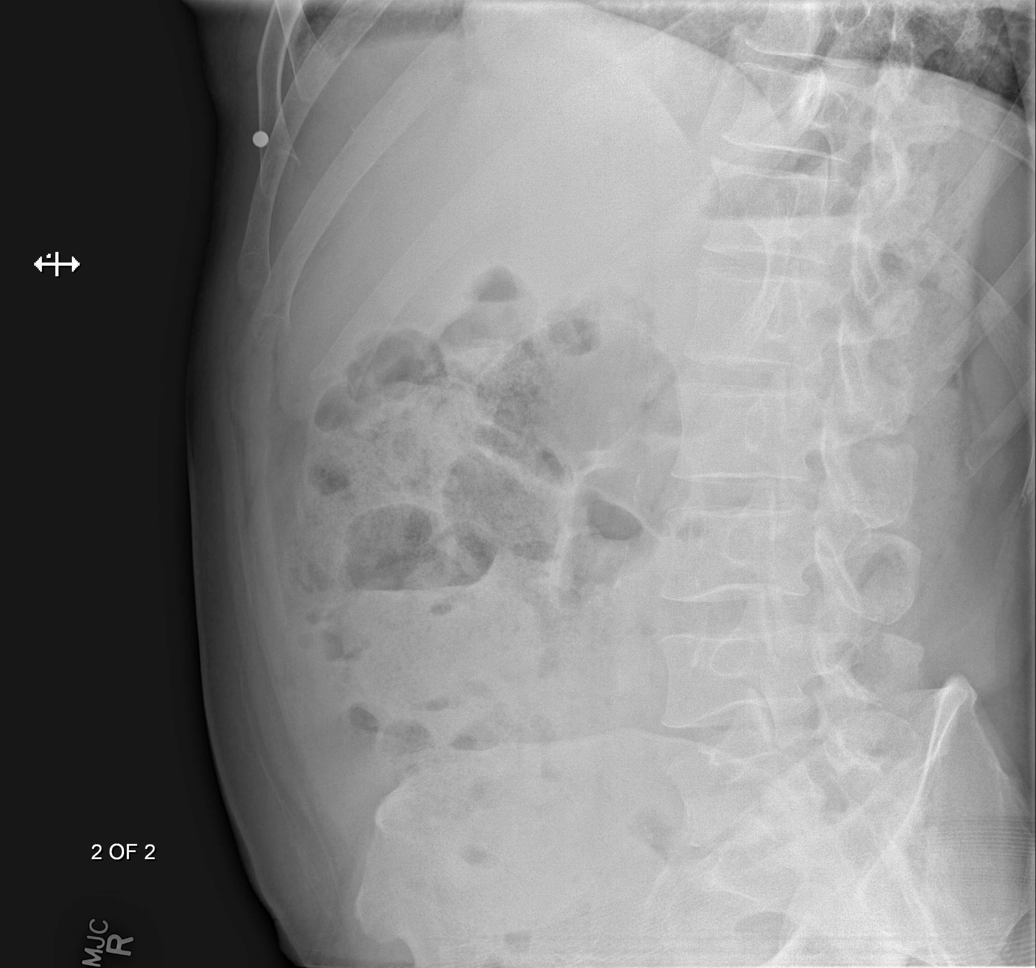

[5 of 5 positions shown; findings below may reference images not displayed]

FINDINGS: Nondisplaced fracture of the lateral right eighth rib. No evidence
of hemothorax, pneumothorax, lung contusion. Normal heart size and
mediastinal contours.
IMPRESSION: 1. Nondisplaced right eighth rib fracture.
2. No evidence of acute intrathoracic disease.

## 2019-06-02 ENCOUNTER — Emergency Department (HOSPITAL_COMMUNITY): Payer: No Typology Code available for payment source

## 2019-06-02 ENCOUNTER — Encounter (HOSPITAL_COMMUNITY): Payer: Self-pay | Admitting: Emergency Medicine

## 2019-06-02 ENCOUNTER — Emergency Department (HOSPITAL_COMMUNITY)
Admission: EM | Admit: 2019-06-02 | Discharge: 2019-06-02 | Disposition: A | Discharge status: Home / Self Care | Payer: No Typology Code available for payment source | Attending: Emergency Medicine | Admitting: Emergency Medicine

## 2019-06-02 ENCOUNTER — Other Ambulatory Visit: Payer: Self-pay

## 2019-06-02 DIAGNOSIS — Z79899 Other long term (current) drug therapy: Secondary | ICD-10-CM | POA: Diagnosis not present

## 2019-06-02 DIAGNOSIS — R0789 Other chest pain: Secondary | ICD-10-CM | POA: Diagnosis present

## 2019-06-02 DIAGNOSIS — B182 Chronic viral hepatitis C: Secondary | ICD-10-CM | POA: Insufficient documentation

## 2019-06-02 DIAGNOSIS — Z720 Tobacco use: Secondary | ICD-10-CM | POA: Insufficient documentation

## 2019-06-02 DIAGNOSIS — I10 Essential (primary) hypertension: Secondary | ICD-10-CM | POA: Insufficient documentation

## 2019-06-02 DIAGNOSIS — R0602 Shortness of breath: Secondary | ICD-10-CM | POA: Diagnosis not present

## 2019-06-02 LAB — CBC WITH DIFFERENTIAL/PLATELET
Abs Immature Granulocytes: 0.01 10*3/uL (ref 0.00–0.07)
Basophils Absolute: 0 10*3/uL (ref 0.0–0.1)
Basophils Relative: 0 %
Eosinophils Absolute: 0.2 10*3/uL (ref 0.0–0.5)
Eosinophils Relative: 3 %
HCT: 42.4 % (ref 39.0–52.0)
Hemoglobin: 14 g/dL (ref 13.0–17.0)
Immature Granulocytes: 0 %
Lymphocytes Relative: 34 %
Lymphs Abs: 2 10*3/uL (ref 0.7–4.0)
MCH: 29.9 pg (ref 26.0–34.0)
MCHC: 33 g/dL (ref 30.0–36.0)
MCV: 90.6 fL (ref 80.0–100.0)
Monocytes Absolute: 0.5 10*3/uL (ref 0.1–1.0)
Monocytes Relative: 9 %
Neutro Abs: 3.2 10*3/uL (ref 1.7–7.7)
Neutrophils Relative %: 54 %
Platelets: 221 10*3/uL (ref 150–400)
RBC: 4.68 MIL/uL (ref 4.22–5.81)
RDW: 12.9 % (ref 11.5–15.5)
WBC: 5.9 10*3/uL (ref 4.0–10.5)
nRBC: 0 % (ref 0.0–0.2)

## 2019-06-02 LAB — BASIC METABOLIC PANEL
Anion gap: 8 (ref 5–15)
BUN: <5 mg/dL — ABNORMAL LOW (ref 6–20)
CO2: 23 mmol/L (ref 22–32)
Calcium: 9.4 mg/dL (ref 8.9–10.3)
Chloride: 109 mmol/L (ref 98–111)
Creatinine, Ser: 0.87 mg/dL (ref 0.61–1.24)
GFR calc Af Amer: >60 mL/min (ref >60)
GFR calc non Af Amer: >60 mL/min (ref >60)
Glucose, Bld: 90 mg/dL (ref 70–99)
Potassium: 3.8 mmol/L (ref 3.5–5.1)
Sodium: 140 mmol/L (ref 135–145)

## 2019-06-02 MED ORDER — ACETAMINOPHEN 325 MG PO TABS
650.0000 mg | ORAL_TABLET | Freq: Once | ORAL | Status: AC
  Administered 2019-06-02: 14:00:00 650 mg via ORAL
  Filled 2019-06-02: qty 2

## 2019-06-02 MED ORDER — MORPHINE SULFATE (PF) 4 MG/ML IV SOLN: 4.0000 mg | Freq: Once | INTRAVENOUS | Status: DC

## 2019-06-02 MED ORDER — ONDANSETRON HCL 4 MG/2ML IJ SOLN: 4.0000 mg | Freq: Once | INTRAMUSCULAR | Status: DC

## 2019-06-02 MED ORDER — CYCLOBENZAPRINE HCL 10 MG PO TABS: 10.0000 mg | ORAL_TABLET | Freq: Two times a day (BID) | ORAL | 0 refills | Status: DC | PRN

## 2019-06-02 MED ORDER — KETOROLAC TROMETHAMINE 30 MG/ML IJ SOLN
30.0000 mg | Freq: Once | INTRAMUSCULAR | Status: AC
  Administered 2019-06-02: 14:00:00 30 mg via INTRAVENOUS
  Filled 2019-06-02: qty 1
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
## 2019-06-02 NOTE — Discharge Instructions (Signed)
Please read instructions below. Apply ice to your areas of pain for 20 minutes at a time. You can take 600 mg of Advil/ibuprofen every 6 hours as needed for pain. You can alternate this with tylenol. You can take flexeril every 12 hours as needed for muscle spasm. Be aware this medication can make you drowsy. Schedule an appointment with your primary care provider to follow up on your visit today. Return to the ER for severely worsening pain, shortness of breath, or new or concerning symptoms.

## 2019-06-02 NOTE — ED Notes (Signed)
Pt is unable to give urine sample at this time. Pt is aware that a UA is needed and will try again later. Urinal is at bedside.
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
## 2020-05-09 ENCOUNTER — Ambulatory Visit (HOSPITAL_COMMUNITY)
Admission: EM | Admit: 2020-05-09 | Discharge: 2020-05-10 | Disposition: A | Discharge status: Home / Self Care | Payer: No Payment, Other | Attending: Registered Nurse | Admitting: Registered Nurse

## 2020-05-09 ENCOUNTER — Other Ambulatory Visit: Payer: Self-pay

## 2020-05-09 ENCOUNTER — Encounter (HOSPITAL_COMMUNITY): Payer: Self-pay | Admitting: Registered Nurse

## 2020-05-09 DIAGNOSIS — F1210 Cannabis abuse, uncomplicated: Secondary | ICD-10-CM | POA: Insufficient documentation

## 2020-05-09 DIAGNOSIS — F1920 Other psychoactive substance dependence, uncomplicated: Secondary | ICD-10-CM | POA: Diagnosis present

## 2020-05-09 DIAGNOSIS — F419 Anxiety disorder, unspecified: Secondary | ICD-10-CM | POA: Insufficient documentation

## 2020-05-09 DIAGNOSIS — F17210 Nicotine dependence, cigarettes, uncomplicated: Secondary | ICD-10-CM | POA: Insufficient documentation

## 2020-05-09 DIAGNOSIS — Z9151 Personal history of suicidal behavior: Secondary | ICD-10-CM | POA: Insufficient documentation

## 2020-05-09 DIAGNOSIS — F1010 Alcohol abuse, uncomplicated: Secondary | ICD-10-CM | POA: Insufficient documentation

## 2020-05-09 DIAGNOSIS — F4310 Post-traumatic stress disorder, unspecified: Secondary | ICD-10-CM | POA: Diagnosis present

## 2020-05-09 DIAGNOSIS — Z20822 Contact with and (suspected) exposure to covid-19: Secondary | ICD-10-CM | POA: Insufficient documentation

## 2020-05-09 DIAGNOSIS — F332 Major depressive disorder, recurrent severe without psychotic features: Secondary | ICD-10-CM | POA: Diagnosis present

## 2020-05-09 DIAGNOSIS — R45851 Suicidal ideations: Secondary | ICD-10-CM | POA: Insufficient documentation

## 2020-05-09 DIAGNOSIS — Z79899 Other long term (current) drug therapy: Secondary | ICD-10-CM | POA: Insufficient documentation

## 2020-05-09 DIAGNOSIS — R450 Nervousness: Secondary | ICD-10-CM | POA: Insufficient documentation

## 2020-05-09 LAB — URINALYSIS, ROUTINE W REFLEX MICROSCOPIC
Bilirubin Urine: NEGATIVE
Glucose, UA: NEGATIVE mg/dL
Hgb urine dipstick: NEGATIVE
Ketones, ur: NEGATIVE mg/dL
Leukocytes,Ua: NEGATIVE
Nitrite: NEGATIVE
Protein, ur: NEGATIVE mg/dL
Specific Gravity, Urine: 1.021 (ref 1.005–1.030)
pH: 6 (ref 5.0–8.0)

## 2020-05-09 LAB — COMPREHENSIVE METABOLIC PANEL
ALT: 19 U/L (ref 0–44)
AST: 28 U/L (ref 15–41)
Albumin: 4.4 g/dL (ref 3.5–5.0)
Alkaline Phosphatase: 106 U/L (ref 38–126)
Anion gap: 12 (ref 5–15)
BUN: 7 mg/dL (ref 6–20)
CO2: 27 mmol/L (ref 22–32)
Calcium: 9.7 mg/dL (ref 8.9–10.3)
Chloride: 99 mmol/L (ref 98–111)
Creatinine, Ser: 0.85 mg/dL (ref 0.61–1.24)
GFR, Estimated: >60 mL/min (ref >60)
Glucose, Bld: 72 mg/dL (ref 70–99)
Potassium: 3.8 mmol/L (ref 3.5–5.1)
Sodium: 138 mmol/L (ref 135–145)
Total Bilirubin: 1.1 mg/dL (ref 0.3–1.2)
Total Protein: 8 g/dL (ref 6.5–8.1)

## 2020-05-09 LAB — CBC WITH DIFFERENTIAL/PLATELET
Abs Immature Granulocytes: 0.01 10*3/uL (ref 0.00–0.07)
Basophils Absolute: 0 10*3/uL (ref 0.0–0.1)
Basophils Relative: 0 %
Eosinophils Absolute: 0.1 10*3/uL (ref 0.0–0.5)
Eosinophils Relative: 2 %
HCT: 44 % (ref 39.0–52.0)
Hemoglobin: 14.2 g/dL (ref 13.0–17.0)
Immature Granulocytes: 0 %
Lymphocytes Relative: 34 %
Lymphs Abs: 1.6 10*3/uL (ref 0.7–4.0)
MCH: 28.7 pg (ref 26.0–34.0)
MCHC: 32.3 g/dL (ref 30.0–36.0)
MCV: 88.9 fL (ref 80.0–100.0)
Monocytes Absolute: 0.5 10*3/uL (ref 0.1–1.0)
Monocytes Relative: 10 %
Neutro Abs: 2.5 10*3/uL (ref 1.7–7.7)
Neutrophils Relative %: 54 %
Platelets: 198 10*3/uL (ref 150–400)
RBC: 4.95 MIL/uL (ref 4.22–5.81)
RDW: 13.8 % (ref 11.5–15.5)
WBC: 4.7 10*3/uL (ref 4.0–10.5)
nRBC: 0 % (ref 0.0–0.2)

## 2020-05-09 LAB — LIPID PANEL
Cholesterol: 246 mg/dL — ABNORMAL HIGH (ref 0–200)
HDL: 88 mg/dL (ref >40)
LDL Cholesterol: 147 mg/dL — ABNORMAL HIGH (ref 0–99)
Total CHOL/HDL Ratio: 2.8 RATIO
Triglycerides: 56 mg/dL (ref <150)
VLDL: 11 mg/dL (ref 0–40)

## 2020-05-09 LAB — HEMOGLOBIN A1C
Hgb A1c MFr Bld: 5.4 % (ref 4.8–5.6)
Mean Plasma Glucose: 108.28 mg/dL

## 2020-05-09 LAB — POCT URINE DRUG SCREEN - MANUAL ENTRY (I-SCREEN)
POC Amphetamine UR: NOT DETECTED
POC Buprenorphine (BUP): NOT DETECTED
POC Cocaine UR: NOT DETECTED
POC Marijuana UR: POSITIVE — AB
POC Methadone UR: NOT DETECTED
POC Methamphetamine UR: NOT DETECTED
POC Morphine: NOT DETECTED
POC Oxazepam (BZO): NOT DETECTED
POC Oxycodone UR: NOT DETECTED
POC Secobarbital (BAR): NOT DETECTED

## 2020-05-09 LAB — ETHANOL: Alcohol, Ethyl (B): <10 mg/dL (ref <10)

## 2020-05-09 LAB — MAGNESIUM: Magnesium: 1.8 mg/dL (ref 1.7–2.4)

## 2020-05-09 LAB — POC SARS CORONAVIRUS 2 AG: SARS Coronavirus 2 Ag: NEGATIVE

## 2020-05-09 LAB — RESP PANEL BY RT-PCR (FLU A&B, COVID) ARPGX2
Influenza A by PCR: NEGATIVE
Influenza B by PCR: NEGATIVE
SARS Coronavirus 2 by RT PCR: NEGATIVE

## 2020-05-09 LAB — TSH: TSH: 2.172 u[IU]/mL (ref 0.350–4.500)

## 2020-05-09 MED ORDER — ALUM & MAG HYDROXIDE-SIMETH 200-200-20 MG/5ML PO SUSP: 30.0000 mL | ORAL | Status: DC | PRN

## 2020-05-09 MED ORDER — ACETAMINOPHEN 325 MG PO TABS: 650.0000 mg | ORAL_TABLET | Freq: Four times a day (QID) | ORAL | Status: DC | PRN

## 2020-05-09 MED ORDER — PANTOPRAZOLE SODIUM 40 MG PO TBEC
40.0000 mg | DELAYED_RELEASE_TABLET | Freq: Every day | ORAL | Status: DC
  Administered 2020-05-09 – 2020-05-10 (×2): 40 mg via ORAL
  Filled 2020-05-09 (×2): qty 1

## 2020-05-09 MED ORDER — MAGNESIUM HYDROXIDE 400 MG/5ML PO SUSP: 30.0000 mL | Freq: Every day | ORAL | Status: DC | PRN

## 2020-05-09 MED ORDER — GABAPENTIN 100 MG PO CAPS
100.0000 mg | ORAL_CAPSULE | Freq: Three times a day (TID) | ORAL | Status: DC
  Administered 2020-05-09 – 2020-05-10 (×3): 100 mg via ORAL
  Filled 2020-05-09 (×3): qty 1

## 2020-05-09 MED ORDER — SERTRALINE HCL 25 MG PO TABS
25.0000 mg | ORAL_TABLET | Freq: Every day | ORAL | Status: DC
  Administered 2020-05-09 – 2020-05-10 (×2): 25 mg via ORAL
  Filled 2020-05-09 (×2): qty 1

## 2020-05-09 MED ORDER — HYDROXYZINE HCL 25 MG PO TABS
25.0000 mg | ORAL_TABLET | Freq: Three times a day (TID) | ORAL | Status: DC | PRN
  Administered 2020-05-09: 21:00:00 25 mg via ORAL
  Filled 2020-05-09: qty 1

## 2020-05-09 MED ORDER — TRAZODONE HCL 100 MG PO TABS
100.0000 mg | ORAL_TABLET | Freq: Every evening | ORAL | Status: DC | PRN
  Administered 2020-05-09: 21:00:00 100 mg via ORAL
  Filled 2020-05-09: qty 1

## 2020-05-09 MED ORDER — IBUPROFEN 600 MG PO TABS: 600.0000 mg | ORAL_TABLET | Freq: Four times a day (QID) | ORAL | Status: DC | PRN

## 2020-05-09 NOTE — ED Provider Notes (Signed)
Behavioral Health Admission [facility] & [facility])  Date: 05/09/20 Patient Name: Angel Golden MRN: [id number] Chief Complaint:  Chief Complaint  Patient presents with  . Suicidal   Chief Complaint/Presenting Problem: NA  Diagnoses:  Final diagnoses:  MDD (major depressive disorder), recurrent severe, without psychosis ([place])  PTSD (post-traumatic stress disorder)  Polysubstance dependence ([place])    HPI: Angel Golden, 59 y.o., male patient presents to [facility] as walk in with complaints of depression and suicidal ideation.   Patient seen face to face by this provider, consulted with Dr. Dwyane Dee; and chart reviewed on 05/09/20.  On evaluation JERAL ZICK reports he has outpatient services with [facility] in [place].  Patient states he has been homeless for last several weeks was staying with friends but had to leave during the holidays.  States that he has family in [place] but none are really supportive.  Patient states he ran out of his psychotropic medications a couple days ago and can tell the change since he has not been taking his medications.  Patient states he is having passive suicidal thoughts with no plan or intent but is unable to contract for safety.   During evaluation Bryden Darden Ehrler is alert/oriented x 4; calm/cooperative; and mood is congruent with affect.  He does not appear to be responding to internal/external stimuli or delusional thoughts.  Patient denies homicidal ideation, psychosis, and paranoia.  Patient answered question appropriately.  Discussed admitting to Continuous Assessment Unit for stabilization.  Patient in agreement    PHQ 2-9:     Total Time spent with patient: 45 minutes  Musculoskeletal  Strength & Muscle Tone: within normal limits Gait & Station: normal Patient leans: N/A  Psychiatric Specialty Exam  Presentation General Appearance: Appropriate for Environment; Casual  Eye Contact:Good  Speech:Clear and Coherent; Normal Rate  Speech  Volume:Normal  Handedness:Right   Mood and Affect  Mood:Depressed  Affect:Congruent; Depressed   Thought Process  Thought Processes:Coherent; Goal Directed  Descriptions of Associations:Intact  Orientation:Full (Time, Place and Person)  Thought Content:WDL  Hallucinations:Hallucinations: None  Ideas of Reference:None  Suicidal Thoughts:Suicidal Thoughts: Yes, Passive (States that he is having thoughts more like no one would miss him if he was dead or he is better off dead) SI Passive Intent and/or Plan: With Intent; With Plan  Homicidal Thoughts:Homicidal Thoughts: No   Sensorium  Memory:Immediate Good; Recent Good; Remote Good  Judgment:Intact  Insight:Present   Executive Functions  Concentration:Good  Attention Span:Good  [place] of Knowledge:Good  Language:Good   Psychomotor Activity  Psychomotor Activity:Psychomotor Activity: Normal   Assets  Assets:Communication Skills; Desire for Improvement   Sleep  Sleep:Sleep: Good   Physical Exam Vitals and nursing note reviewed. Exam conducted with a chaperone present.  Constitutional:      General: He is not in acute distress.    Appearance: Normal appearance. He is not ill-appearing.  HENT:     Head: Normocephalic and atraumatic.  Eyes:     Pupils: Pupils are equal, round, and reactive to light.  Cardiovascular:     Rate and Rhythm: Normal rate.  Pulmonary:     Effort: Pulmonary effort is normal.     Breath sounds: Normal breath sounds.  Musculoskeletal:        General: Normal range of motion.     Cervical back: Normal range of motion.  Skin:    General: Skin is warm and dry.  Neurological:     Mental Status: He is alert and oriented  to person, place, and time.  Psychiatric:        Attention and Perception: Attention and perception normal. He does not perceive auditory or visual hallucinations.        Mood and Affect: Affect normal. Mood is depressed.        Speech: Speech  normal.        Behavior: Behavior normal. Behavior is cooperative.        Thought Content: Thought content normal. Thought content is not paranoid or delusional. Thought content does not include homicidal (Passive thoughts but unable to contract for safety) ideation.        Cognition and Memory: Cognition and memory normal.        Judgment: Judgment normal.    Review of Systems  Constitutional: Negative.   HENT: Negative.   Eyes: Negative.   Respiratory: Negative.   Cardiovascular: Negative.        Reports a history of hypertension  Gastrointestinal: Negative.   Genitourinary: Negative.   Musculoskeletal: Negative.   Skin: Negative.   Neurological: Negative.   Endo/Heme/Allergies: Negative.   Psychiatric/Behavioral: Positive for depression. Negative for hallucinations and memory loss. Substance abuse: Alcohol occassional "weed"  Suicidal ideas: Passive. Nervous/anxious: Stable. Insomnia: denies at this time.     Blood pressure (!) 156/109, pulse 91, temperature 98.5 F (36.9 C), temperature source Oral, resp. rate 18, SpO2 100 %. There is no height or weight on file to calculate BMI.  Past Psychiatric History: Reports prior suicide attempt via overdose 2-3 years ago and psychiatric admission.  Also reports drinks alcohol 3 x week but not excessively, occassionally smokes marijuana; last smoked about a month ago.    Is the patient at risk to self? Yes  Has the patient been a risk to self in the past 6 months? No .    Has the patient been a risk to self within the distant past? Yes   Is the patient a risk to others? No   Has the patient been a risk to others in the past 6 months? No   Has the patient been a risk to others within the distant past? No   Past Medical History:  Past Medical History:  Diagnosis Date  . Bone spur of other site    rt foot  . Cirrhosis ([place])   . Hepatitis C   . Hypertension   . PTSD (post-traumatic stress disorder)    History reviewed. No pertinent  surgical history.  Family History: History reviewed. No pertinent family history.  Social History:  Social History   Socioeconomic History  . Marital status: Married    Spouse name: Not on file  . Number of children: Not on file  . Years of education: Not on file  . Highest education level: Not on file  Occupational History  . Not on file  Tobacco Use  . Smoking status: Current Some Day Smoker    Packs/day: 0.50    Types: Cigarettes  . Smokeless tobacco: Never Used  Vaping Use  . Vaping Use: Never used  Substance and Sexual Activity  . Alcohol use: Yes    Comment: occasionally   . Drug use: No  . Sexual activity: Not on file  Other Topics Concern  . Not on file  Social History Narrative  . Not on file   Social Determinants of Health   Financial Resource Strain: Not on file  Food Insecurity: Not on file  Transportation Needs: Not on file  Physical Activity: Not on  file  Stress: Not on file  Social Connections: Not on file  Intimate Partner Violence: Not on file    SDOH:  SDOH Screenings   Alcohol Screen: Not on file  Depression (LGX2-1): Not on file  Financial Resource Strain: Not on file  Food Insecurity: Not on file  Housing: Not on file  Physical Activity: Not on file  Social Connections: Not on file  Stress: Not on file  Tobacco Use: High Risk  . Smoking Tobacco Use: Current Some Day Smoker  . Smokeless Tobacco Use: Never Used  Transportation Needs: Not on file    Last Labs:  No visits with results within 6 Month(s) from this visit.  Latest known visit with results is:  Admission on 06/02/2019, Discharged on 06/02/2019  Component Date Value Ref Range Status  . Sodium 06/02/2019 140  135 - 145 mmol/L Final  . Potassium 06/02/2019 3.8  3.5 - 5.1 mmol/L Final  . Chloride 06/02/2019 109  98 - 111 mmol/L Final  . CO2 06/02/2019 23  22 - 32 mmol/L Final  . Glucose, Bld 06/02/2019 90  70 - 99 mg/dL Final  . BUN 06/02/2019 <5* 6 - 20 mg/dL Final  .  Creatinine, Ser 06/02/2019 0.87  0.61 - 1.24 mg/dL Final  . Calcium 06/02/2019 9.4  8.9 - 10.3 mg/dL Final  . GFR calc non Af Amer 06/02/2019 >60  >60 mL/min Final  . GFR calc Af Amer 06/02/2019 >60  >60 mL/min Final  . Anion gap 06/02/2019 8  5 - 15 Final   Performed at [facility], [place] [address] [place], [place] [address]  . WBC 06/02/2019 5.9  4.0 - 10.5 K/uL Final  . RBC 06/02/2019 4.68  4.22 - 5.81 MIL/uL Final  . Hemoglobin 06/02/2019 14.0  13.0 - 17.0 g/dL Final  . HCT 06/02/2019 42.4  39.0 - 52.0 % Final  . MCV 06/02/2019 90.6  80.0 - 100.0 fL Final  . MCH 06/02/2019 29.9  26.0 - 34.0 pg Final  . MCHC 06/02/2019 33.0  30.0 - 36.0 g/dL Final  . RDW 06/02/2019 12.9  11.5 - 15.5 % Final  . Platelets 06/02/2019 221  150 - 400 K/uL Final  . nRBC 06/02/2019 0.0  0.0 - 0.2 % Final  . Neutrophils Relative % 06/02/2019 54  % Final  . Neutro Abs 06/02/2019 3.2  1.7 - 7.7 K/uL Final  . Lymphocytes Relative 06/02/2019 34  % Final  . Lymphs Abs 06/02/2019 2.0  0.7 - 4.0 K/uL Final  . Monocytes Relative 06/02/2019 9  % Final  . Monocytes Absolute 06/02/2019 0.5  0.1 - 1.0 K/uL Final  . Eosinophils Relative 06/02/2019 3  % Final  . Eosinophils Absolute 06/02/2019 0.2  0.0 - 0.5 K/uL Final  . Basophils Relative 06/02/2019 0  % Final  . Basophils Absolute 06/02/2019 0.0  0.0 - 0.1 K/uL Final  . Immature Granulocytes 06/02/2019 0  % Final  . Abs Immature Granulocytes 06/02/2019 0.01  0.00 - 0.07 K/uL Final   Performed at [facility], [place] [address], [place], [place] [address]    Allergies: Patient has no known allergies.  PTA Medications: (Not in a hospital admission)   Medical Decision Making  Patient admitted to Continuous Assessment Unit for stabilization and medication unit  Routine Labs ordered  Lab Orders     Resp Panel by RT-PCR (Flu A&B, Covid) Nasopharyngeal Swab     CBC with Differential/Platelet     Comprehensive metabolic panel  Hemoglobin A1c      Magnesium     Ethanol     Lipid panel     TSH     Urinalysis, Routine w reflex microscopic Urine, Clean Catch     POC SARS Coronavirus 2 Ag-ED - Nasal Swab (BD Veritor Kit)     POCT Urine Drug Screen - ([facility])   Medication Management:  Restated home medications: Zoloft 25 mg daily and Trazodone 100 mg Q hs prn Added Vistaril 25 mg tid prn for anxiety Added Gabapentin 100 mg tid alcohol use disorder  Recommendations  Based on my evaluation the patient does not appear to have an emergency medical condition.  Kandie Keiper, NP 05/09/20  1:33 PM

## 2020-05-09 NOTE — ED Notes (Signed)
Pt sleeping at present, no distress noted, monitoring for safety. 

## 2020-05-09 NOTE — ED Notes (Signed)
Pt sitting up at beside at present, no distress noted, A&O x 4, resting at present.  Monitoring for safety.

## 2020-05-09 NOTE — ED Notes (Signed)
Patient arrived on unit, belongings in locker 33

## 2020-05-09 NOTE — ED Notes (Signed)
Patient given sandwich, support & encouragement provided by MHT

## 2020-05-09 NOTE — Discharge Instructions (Addendum)
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
## 2020-05-09 NOTE — ED Triage Notes (Signed)
Patient brought in by [facility]. Patient states he is having SI thoughts no plan. Patient states he took a bunch of pills last time. Patient denies HI and A/V/H.

## 2020-05-10 MED ORDER — GABAPENTIN 100 MG PO CAPS: 100.0000 mg | ORAL_CAPSULE | Freq: Three times a day (TID) | ORAL | 0 refills | Status: DC

## 2020-05-10 MED ORDER — PANTOPRAZOLE SODIUM 40 MG PO TBEC: 40.0000 mg | DELAYED_RELEASE_TABLET | Freq: Every day | ORAL | 0 refills | Status: DC

## 2020-05-10 MED ORDER — SERTRALINE HCL 25 MG PO TABS: 25.0000 mg | ORAL_TABLET | Freq: Every day | ORAL | 0 refills | Status: DC

## 2020-05-10 MED ORDER — TRAZODONE HCL 100 MG PO TABS: 100.0000 mg | ORAL_TABLET | Freq: Every evening | ORAL | 0 refills | Status: DC | PRN

## 2020-05-10 NOTE — Progress Notes (Signed)
Patient is alert and oriented X 4 denies SI, HI and AVH. Patient is pleasant, states he slept well. Patient denies pain at this time. No objective signs of pain, discomfort or crisis at this time.

## 2020-05-10 NOTE — ED Provider Notes (Signed)
FBC/OBS ASAP Discharge Summary  Date and Time: 05/10/2020 10:36 AM  Name: Angel Golden  MRN:  [id number]   Discharge Diagnoses:  Final diagnoses:  MDD (major depressive disorder), recurrent severe, without psychosis (HCC)  PTSD (post-traumatic stress disorder)  Polysubstance dependence (HCC)    Subjective: Patient reports that his biggest concern is housing at the moment.  He states that he has been not been able to return living with friends or family due to the holidays and she does not have anywhere else to turn.  He states he has family in the [place] area but they are not very supportive.  Patient reports that he has a car.  Discussed with patient about going to shelters and patient reports that he refuses to do live in a shelter.  He is informed of rescue mission such as Occupational hygienist in [employer].  Patient states he is interested in [place] rescue [facility].  Patient was informed that they would potentially accept him after a phone interview with the owner.  Patient was provided with the phone number as well as the information about the [place] rescue mission.  Patient then stated that he just needed to be restarted on his medications and wanted to return to his dad's where his car is before he goes anywhere.  Patient stated that he did not want to make the phone call now and would call the person later.  Patient denied having any suicidal or homicidal ideations and denied any hallucinations.  Stay Summary: Patient is a 59 year old male who presented to the [facility] as a walk-in complaints of depression with suicidal ideations.  Patient reported that he receives veteran services in [place] and that he has been homeless for last several weeks but is been able to stay with friends due to the holidays.  Patient reported family that are unsupportive that live in the [place] area and this is caused him to feel more depressed and have suicidal ideations, not to mention he  ran out of his medications a few days ago.  Patient was admitted to continuous observation unit for overnight assessment.  Patient was restarted on his medications of Neurontin, Zoloft, and trazodone.  Today the patient reports that he is no longer suicidal nor homicidal and denies any hallucinations.  Patient is reporting that he does need a place to stay because he has nowhere to go.  After various attempts in getting patient placed for housing he was potentially accepted at [place] rescue [facility] and needed to make a phone interview and patient refused to do it while he was here and requested to be discharged to return to his father's house to get his car and stated that he would contact them after he leaves.  Patient continued to deny any suicidal or homicidal ideations.  Patient was provided with 30-day prescriptions of his Zoloft 25 mg p.o. daily, Neurontin 100 mg p.o. 3 times daily, and trazodone 100 mg p.o. nightly.  Patient was informed to [facility] with [facility] for continued medication management and was also provided with information to contact [place] [facility] if he decided to go there for housing.  Patient was transported to his father's house via safe transport.  Total Time spent with patient: 30 minutes  Past Psychiatric History: PTSD, depression Past Medical History:  Past Medical History:  Diagnosis Date  . Bone spur of other site    rt foot  . Cirrhosis (HCC)   . Hepatitis C   . Hypertension   .  PTSD (post-traumatic stress disorder)    History reviewed. No pertinent surgical history. Family History: History reviewed. No pertinent family history. Family Psychiatric History: None reported Social History:  Social History   Substance and Sexual Activity  Alcohol Use Yes   Comment: occasionally      Social History   Substance and Sexual Activity  Drug Use No    Social History   Socioeconomic History  . Marital status: Married    Spouse name: Not on  file  . Number of children: Not on file  . Years of education: Not on file  . Highest education level: Not on file  Occupational History  . Not on file  Tobacco Use  . Smoking status: Current Some Day Smoker    Packs/day: 0.50    Types: Cigarettes  . Smokeless tobacco: Never Used  Vaping Use  . Vaping Use: Never used  Substance and Sexual Activity  . Alcohol use: Yes    Comment: occasionally   . Drug use: No  . Sexual activity: Not on file  Other Topics Concern  . Not on file  Social History Narrative  . Not on file   Social Determinants of Health   Financial Resource Strain: Not on file  Food Insecurity: Not on file  Transportation Needs: Not on file  Physical Activity: Not on file  Stress: Not on file  Social Connections: Not on file   SDOH:  SDOH Screenings   Alcohol Screen: Not on file  Depression (EXB2-8): Not on file  Financial Resource Strain: Not on file  Food Insecurity: Not on file  Housing: Not on file  Physical Activity: Not on file  Social Connections: Not on file  Stress: Not on file  Tobacco Use: High Risk  . Smoking Tobacco Use: Current Some Day Smoker  . Smokeless Tobacco Use: Never Used  Transportation Needs: Not on file    Has this patient used any form of tobacco in the last 30 days? (Cigarettes, Smokeless Tobacco, Cigars, and/or Pipes) A prescription for an FDA-approved tobacco cessation medication was offered at discharge and the patient refused  Current Medications:  Current Facility-Administered Medications  Medication Dose Route Frequency Provider Last Rate Last Admin  . acetaminophen (TYLENOL) tablet 650 mg  650 mg Oral Q6H PRN Rankin, Shuvon B, NP      . alum & mag hydroxide-simeth (MAALOX/MYLANTA) 200-200-20 MG/5ML suspension 30 mL  30 mL Oral Q4H PRN Rankin, Shuvon B, NP      . gabapentin (NEURONTIN) capsule 100 mg  100 mg Oral TID Rankin, Shuvon B, NP   100 mg at 05/10/20 0946  . hydrOXYzine (ATARAX/VISTARIL) tablet 25 mg  25 mg  Oral TID PRN Rankin, Shuvon B, NP   25 mg at 05/09/20 2059  . ibuprofen (ADVIL) tablet 600 mg  600 mg Oral Q6H PRN Rankin, Shuvon B, NP      . magnesium hydroxide (MILK OF MAGNESIA) suspension 30 mL  30 mL Oral Daily PRN Rankin, Shuvon B, NP      . pantoprazole (PROTONIX) EC tablet 40 mg  40 mg Oral Daily Rankin, Shuvon B, NP   40 mg at 05/10/20 0945  . sertraline (ZOLOFT) tablet 25 mg  25 mg Oral Daily Rankin, Shuvon B, NP   25 mg at 05/10/20 0947  . traZODone (DESYREL) tablet 100 mg  100 mg Oral QHS PRN Rankin, Shuvon B, NP   100 mg at 05/09/20 2059   Current Outpatient Medications  Medication Sig Dispense Refill  .  gabapentin (NEURONTIN) 100 MG capsule Take 1 capsule (100 mg total) by mouth 3 (three) times daily. 90 capsule 0  . [START ON 05/11/2020] pantoprazole (PROTONIX) 40 MG tablet Take 1 tablet (40 mg total) by mouth daily. 30 tablet 0  . [START ON 05/11/2020] sertraline (ZOLOFT) 25 MG tablet Take 1 tablet (25 mg total) by mouth daily. 30 tablet 0  . traZODone (DESYREL) 100 MG tablet Take 1 tablet (100 mg total) by mouth at bedtime as needed for sleep. 30 tablet 0    PTA Medications: (Not in a hospital admission)   Musculoskeletal  Strength & Muscle Tone: within normal limits Gait & Station: normal Patient leans: N/A  Psychiatric Specialty Exam  Presentation  General Appearance: Appropriate for Environment; Casual  Eye Contact:Good  Speech:Clear and Coherent; Normal Rate  Speech Volume:Normal  Handedness:Right   Mood and Affect  Mood:Euthymic  Affect:Appropriate; Congruent   Thought Process  Thought Processes:Coherent  Descriptions of Associations:Intact  Orientation:Full (Time, Place and Person)  Thought Content:WDL  Hallucinations:Hallucinations: None  Ideas of Reference:None  Suicidal Thoughts:Suicidal Thoughts: No SI Passive Intent and/or Plan: With Intent; With Plan  Homicidal Thoughts:Homicidal Thoughts: No   Sensorium  Memory:Immediate  Good; Recent Good; Remote Good  Judgment:Intact  Insight:Good   Executive Functions  Concentration:Good  Attention Span:Good  Recall:Good  Fund of Knowledge:Good  Language:Good   Psychomotor Activity  Psychomotor Activity:Psychomotor Activity: Normal   Assets  Assets:Communication Skills; Desire for Improvement; Financial Resources/Insurance; Social Support; Physical Health; Transportation   Sleep  Sleep:Sleep: Good   Physical Exam  Physical Exam Vitals and nursing note reviewed.  Constitutional:      Appearance: He is well-developed.  HENT:     Head: Normocephalic.  Eyes:     Pupils: Pupils are equal, round, and reactive to light.  Cardiovascular:     Rate and Rhythm: Normal rate.  Pulmonary:     Effort: Pulmonary effort is normal.  Musculoskeletal:        General: Normal range of motion.  Neurological:     Mental Status: He is alert and oriented to person, place, and time.    Review of Systems  Constitutional: Negative.   HENT: Negative.   Eyes: Negative.   Respiratory: Negative.   Cardiovascular: Negative.   Gastrointestinal: Negative.   Genitourinary: Negative.   Musculoskeletal: Negative.   Skin: Negative.   Neurological: Negative.   Endo/Heme/Allergies: Negative.   Psychiatric/Behavioral: Negative.    Blood pressure (!) 116/92, pulse 95, temperature 98.4 F (36.9 C), temperature source Oral, resp. rate 16, SpO2 100 %. There is no height or weight on file to calculate BMI.  Demographic Factors:  Male and Low socioeconomic status  Loss Factors: NA  Historical Factors: NA  Risk Reduction Factors:   Sense of responsibility to family, Living with another person, especially a relative, Positive social support and Positive therapeutic relationship  Continued Clinical Symptoms:  Alcohol/Substance Abuse/Dependencies Previous Psychiatric Diagnoses and Treatments  Cognitive Features That Contribute To Risk:  None    Suicide Risk:   Mild:  Suicidal ideation of limited frequency, intensity, duration, and specificity.  There are no identifiable plans, no associated intent, mild dysphoria and related symptoms, good self-control (both objective and subjective assessment), few other risk factors, and identifiable protective factors, including available and accessible social support.  Plan Of Care/Follow-up recommendations:  Continue activity as tolerated. Continue diet as recommended by your PCP. Ensure to keep all appointments with outpatient providers.  Disposition: Discharge to home  Maryfrances Bunnell, FNP  05/10/2020, 10:36 AM

## 2020-05-10 NOTE — ED Notes (Signed)
Pt sleeping at present.  No distress noted, monitoring for safety. 

## 2020-05-10 NOTE — Discharge Summary (Signed)
Epifanio Lesches [place] to be D/C'd Home per MD order.  Discussed with the patient and all questions fully answered.  VSS, Skin clean, dry and intact without evidence of skin break down, no evidence of skin tears noted.   An After Visit Summary was printed and given to the patient. Patient received prescriptions.  D/c education completed with patient including follow up instructions, medication list, d/c activities limitations if indicated, with other d/c instructions as indicated by NP. Patient to follow up with [facility], and shelter resources given.  Patient able to verbalize understanding, all questions fully answered.   Patient instructed to return to ED, call 911  for any changes in condition.   Patient escorted via Psychologist, educational.  Leamon Arnt 05/10/2020 10:55 AM
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
## 2021-09-30 ENCOUNTER — Ambulatory Visit (HOSPITAL_COMMUNITY)
Admission: EM | Admit: 2021-09-30 | Discharge: 2021-09-30 | Disposition: A | Discharge status: Home / Self Care | Payer: Self-pay

## 2021-09-30 ENCOUNTER — Encounter (HOSPITAL_COMMUNITY): Payer: Self-pay | Admitting: *Deleted

## 2021-09-30 DIAGNOSIS — I10 Essential (primary) hypertension: Secondary | ICD-10-CM

## 2021-09-30 DIAGNOSIS — K644 Residual hemorrhoidal skin tags: Secondary | ICD-10-CM

## 2021-09-30 MED ORDER — HYDROCORTISONE (PERIANAL) 2.5 % EX CREA: 1.0000 "application " | TOPICAL_CREAM | Freq: Two times a day (BID) | CUTANEOUS | 0 refills | Status: DC

## 2021-09-30 MED ORDER — HYDROCORTISONE ACETATE 25 MG RE SUPP: 25.0000 mg | Freq: Two times a day (BID) | RECTAL | 0 refills | Status: DC

## 2021-09-30 MED ORDER — AMLODIPINE BESYLATE 5 MG PO TABS: 5.0000 mg | ORAL_TABLET | Freq: Every day | ORAL | 1 refills | Status: DC

## 2021-09-30 NOTE — ED Provider Notes (Addendum)
[facility]    CSN: [id number] Arrival date & time: 09/30/21  1247      History   Chief Complaint Chief Complaint  Patient presents with   Rectal Pain    HPI ZEV BLUE is a 61 y.o. male.   Patient presents today with a 1 month history of intermittent rectal discomfort.  He reports being told he has hemorrhoids and believes that these are flared.  [facility] reports [facility] at which point the hemorrhoids were noted but has not had any procedures to remove these.  He denies any significant constipation and is able to pass stool without is painful.  He has tried over-the-counter medications including Preparation H and Tucks.  He is also tried sitz bath's without improvement of symptoms.  He denies any fever, nausea, vomiting, abdominal pain, diarrhea, melena, hematochezia, urinary symptoms.  In addition, patient's blood pressure was noted to be elevated today.  He denies any chest pain, shortness of breath, headache, vision change.  He denies use of caffeine, sodium, decongestants, NSAIDs.  He is followed by [facility] S[facility] for primary care and does have a history of hypertension.  He is prescribed an antihypertensive medication but is unsure of the name and has not been taking it consistently.  Review of care coordination documents indicate he takes amlodipine 5 mg daily.   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 Authorizing Provider  amLODipine (NORVASC) 5 MG tablet Take 1 tablet (5 mg total) by mouth daily. 09/30/21  Yes Jefferie Holston, Noberto Retort, PA-C  hydrocortisone (ANUSOL-HC) 2.5 % rectal cream Place 1 application. rectally 2 (two) times daily. 09/30/21  Yes Hiroshi Krummel, Noberto Retort, PA-C  hydrocortisone (ANUSOL-HC) 25 MG suppository Place 1 suppository (25 mg total) rectally 2 (two) times daily. 09/30/21  Yes Joleen Stuckert K, PA-C  traZODone (DESYREL) 100 MG tablet Take 1 tablet (100 mg total) by mouth at bedtime as needed for sleep. 05/10/20  Yes Money, Gerlene Burdock, FNP    Family History History reviewed. No pertinent family history.  Social History Social History   Tobacco Use   Smoking status: Some Days    Packs/day: 0.50    Types: Cigarettes   Smokeless tobacco: Never  Vaping Use   Vaping Use: Never used  Substance Use Topics   Alcohol use: Yes    Alcohol/week: 12.0 standard drinks    Types: 12 Cans of beer per week   Drug use: Not Currently    Types: Cocaine, Marijuana     Allergies   Patient has no known allergies.   Review of Systems Review of Systems  Constitutional:  Positive for activity change. Negative for appetite change, fatigue and fever.  Eyes:  Negative for visual disturbance.  Respiratory:  Negative for  cough and shortness of breath.   Cardiovascular:  Negative for chest pain.  Gastrointestinal:  Positive for rectal pain. Negative for abdominal pain, anal bleeding, blood in stool, constipation, diarrhea, nausea and vomiting.  Genitourinary:  Negative for dysuria, frequency, hematuria and urgency.  Neurological:  Negative for dizziness, light-headedness and headaches.    Physical Exam Triage Vital Signs ED Triage Vitals  Enc Vitals Group     BP 09/30/21 1331 (!) 169/106     Pulse Rate 09/30/21 1331 86     Resp 09/30/21 1331 16     Temp 09/30/21 1331 98 F (36.7 C)     Temp Source  09/30/21 1331 Oral     SpO2 09/30/21 1331 97 %     Weight --      Height --      Head Circumference --      Peak Flow --      Pain Score 09/30/21 1332 10     Pain Loc --      Pain Edu? --      Excl. in GC? --    No data found.  Updated Vital Signs BP (!) 154/94 (BP Location: Right Arm)   Pulse 86   Temp 98 F (36.7 C) (Oral)   Resp 16   SpO2 97%   Visual Acuity Right Eye Distance:   Left Eye Distance:   Bilateral Distance:    Right Eye Near:   Left Eye Near:    Bilateral Near:     Physical Exam Vitals reviewed. Exam conducted with a chaperone present.  Constitutional:      General: He is awake.     Appearance: Normal appearance. He is well-developed. He is not ill-appearing.     Comments: Very pleasant male appears stated age in no acute distress sitting comfortably in exam room  HENT:     Head: Normocephalic and atraumatic.     Mouth/Throat:     Pharynx: No oropharyngeal exudate, posterior oropharyngeal erythema or uvula swelling.  Cardiovascular:     Rate and Rhythm: Normal rate and regular rhythm.     Heart sounds: Normal heart sounds, S1 normal and S2 normal. No murmur heard. Pulmonary:     Effort: Pulmonary effort is normal.     Breath sounds: Normal breath sounds. No stridor. No wheezing, rhonchi or rales.     Comments: Clear to auscultation bilaterally Abdominal:     General: Bowel sounds are normal.     Palpations: Abdomen is soft.     Tenderness: There is no abdominal tenderness.     Comments: Benign abdominal exam  Genitourinary:    Rectum: Tenderness and external hemorrhoid present. No anal fissure or internal hemorrhoid. Normal anal tone.     Comments: Torrie, CMA present as chaperone.  External hemorrhoid noted 5 o'clock position with associated tenderness.  No evidence of thrombosed hemorrhoid. Neurological:     Mental Status: He is alert.  Psychiatric:        Behavior: Behavior is cooperative.     UC Treatments / Results  Labs (all labs  ordered are listed, but only abnormal results are displayed) Labs Reviewed - No data to display  EKG   Radiology No results found.  Procedures Procedures (including critical care time)  Medications Ordered in UC Medications - No data to display  Initial Impression / Assessment and Plan / UC Course  I have reviewed the triage vital signs and the nursing notes.  Pertinent labs & imaging results that were  available during my care of the patient were reviewed by me and considered in my medical decision making (see chart for details).     Hemorrhoids were noted on exam and suspect this is the cause of patient's discomfort.  We will start hydrocortisone cream and suppositories.  Discussed the importance of avoiding constipation as this will exacerbate symptoms. He can continue using over-the-counter medications including Preparation H and Tucks for additional symptom relief as well as sitz bath.  Discussed that if symptoms or not improving he should follow-up with GI versus general surgery was given contact information with instruction to call to schedule an appointment.  If he develops any severe symptoms including rectal pain, fever, nausea, vomiting, urinary symptoms, difficulty passing stool he needs to be seen immediately to which he expressed understanding.  Blood pressure is elevated today.  Discussed the importance of compliance with blood pressure medication.  Patient is unsure if he has amlodipine available at home so refill was sent to the pharmacy.  He reports a follow-up appointment with his PCP within the next few weeks, strongly encouraged to keep this appointment.  If he has any difficulty seeing them he is to return to our clinic for blood pressure recheck.  Discussed that he is to avoid NSAIDs, caffeine, sodium, decongestants.  If he develops any chest pain, shortness of breath, headache, vision change, dizziness in setting of high blood pressure he is to go to the emergency room  to which he expressed understanding.    Final Clinical Impressions(s) / UC Diagnoses   Final diagnoses:  External hemorrhoid  Elevated blood pressure reading with diagnosis of hypertension     Discharge Instructions      I believe that your hemorrhoids are contributing to your symptoms.  Please use hydrocortisone cream and suppository to manage your symptoms.  Avoid constipation by making sure you are drinking plenty of fluid and eating lots of fiber.  If you have any worsening symptoms including severe rectal pain, fever, nausea, vomiting, difficulty passing stool you need to be seen immediately.  Follow-up with surgeon versus GI specialist if symptoms or not improving.  Your blood pressure is elevated.  Please take your blood pressure medication as prescribed.  I sent a refill to your pharmacy.  Follow-up with your PCP.  If you develop any chest pain, shortness of breath, headache, vision change in the setting of high blood pressure you need to go to the emergency room.     ED Prescriptions     Medication Sig Dispense Auth. Provider   amLODipine (NORVASC) 5 MG tablet Take 1 tablet (5 mg total) by mouth daily. 30 tablet Jarred Purtee K, PA-C   hydrocortisone (ANUSOL-HC) 2.5 % rectal cream Place 1 application. rectally 2 (two) times daily. 30 g Josten Warmuth K, PA-C   hydrocortisone (ANUSOL-HC) 25 MG suppository Place 1 suppository (25 mg total) rectally 2 (two) times daily. 12 suppository Thirza Pellicano, Noberto RetortErin K, PA-C      PDMP not reviewed this encounter.   Jeani HawkingRaspet, Mikylah Ackroyd K, PA-C 09/30/21 1409    Gayna Braddy, Noberto Retortrin K, PA-C 09/30/21 1412

## 2021-09-30 NOTE — Discharge Instructions (Signed)
I believe that your hemorrhoids are contributing to your symptoms.  Please use hydrocortisone cream and suppository to manage your symptoms.  Avoid constipation by making sure you are drinking plenty of fluid and eating lots of fiber.  If you have any worsening symptoms including severe rectal pain, fever, nausea, vomiting, difficulty passing stool you need to be seen immediately.  Follow-up with surgeon versus GI specialist if symptoms or not improving.  Your blood pressure is elevated.  Please take your blood pressure medication as prescribed.  I sent a refill to your pharmacy.  Follow-up with your PCP.  If you develop any chest pain, shortness of breath, headache, vision change in the setting of high blood pressure you need to go to the emergency room.

## 2021-09-30 NOTE — ED Triage Notes (Signed)
Pt reports having intermittent rectal swelling and pain x approx 1 month - believes he may have hemorrhoids. Denies any bleeding. Has been using OTC hemorrhoid treatments without any relief.

## 2021-10-25 ENCOUNTER — Telehealth (HOSPITAL_COMMUNITY): Payer: Self-pay | Admitting: Emergency Medicine

## 2021-10-25 MED ORDER — AMLODIPINE BESYLATE 5 MG PO TABS: 5.0000 mg | ORAL_TABLET | Freq: Every day | ORAL | 1 refills | Status: DC

## 2021-10-25 MED ORDER — HYDROCORTISONE ACETATE 25 MG RE SUPP: 25.0000 mg | Freq: Two times a day (BID) | RECTAL | 0 refills | Status: DC

## 2021-10-25 MED ORDER — HYDROCORTISONE (PERIANAL) 2.5 % EX CREA: 1.0000 "application " | TOPICAL_CREAM | Freq: Two times a day (BID) | CUTANEOUS | 0 refills | Status: DC

## 2021-10-25 NOTE — Telephone Encounter (Signed)
Patient called and states he needs his medications from his visit on 5/20 send to the [facility] in [place].  I called the original pharmacy to confirm the medications were never picked up and cancelled the orders.  Then resent to correct pharmacy and notified patient.

## 2021-11-16 ENCOUNTER — Emergency Department (HOSPITAL_COMMUNITY)
Admission: EM | Admit: 2021-11-16 | Discharge: 2021-11-16 | Discharge status: Against Medical Advice | Payer: 59 | Attending: Emergency Medicine | Admitting: Emergency Medicine

## 2021-11-16 ENCOUNTER — Encounter (HOSPITAL_COMMUNITY): Payer: Self-pay

## 2021-11-16 ENCOUNTER — Ambulatory Visit (HOSPITAL_COMMUNITY)
Admission: EM | Admit: 2021-11-16 | Discharge: 2021-11-16 | Disposition: A | Discharge status: Home / Self Care | Payer: 59 | Attending: Internal Medicine | Admitting: Internal Medicine

## 2021-11-16 ENCOUNTER — Emergency Department (HOSPITAL_COMMUNITY): Payer: 59

## 2021-11-16 ENCOUNTER — Other Ambulatory Visit: Payer: Self-pay

## 2021-11-16 DIAGNOSIS — R000 Tachycardia, unspecified: Secondary | ICD-10-CM

## 2021-11-16 DIAGNOSIS — R1013 Epigastric pain: Secondary | ICD-10-CM

## 2021-11-16 DIAGNOSIS — R112 Nausea with vomiting, unspecified: Secondary | ICD-10-CM | POA: Insufficient documentation

## 2021-11-16 DIAGNOSIS — R079 Chest pain, unspecified: Secondary | ICD-10-CM | POA: Diagnosis not present

## 2021-11-16 DIAGNOSIS — Z5321 Procedure and treatment not carried out due to patient leaving prior to being seen by health care provider: Secondary | ICD-10-CM | POA: Insufficient documentation

## 2021-11-16 DIAGNOSIS — E860 Dehydration: Secondary | ICD-10-CM | POA: Diagnosis not present

## 2021-11-16 DIAGNOSIS — R1084 Generalized abdominal pain: Secondary | ICD-10-CM | POA: Diagnosis not present

## 2021-11-16 DIAGNOSIS — R197 Diarrhea, unspecified: Secondary | ICD-10-CM | POA: Diagnosis not present

## 2021-11-16 DIAGNOSIS — R0602 Shortness of breath: Secondary | ICD-10-CM | POA: Diagnosis not present

## 2021-11-16 DIAGNOSIS — R9431 Abnormal electrocardiogram [ECG] [EKG]: Secondary | ICD-10-CM | POA: Diagnosis not present

## 2021-11-16 LAB — COMPREHENSIVE METABOLIC PANEL
ALT: 29 U/L (ref 0–44)
AST: 38 U/L (ref 15–41)
Albumin: 5.2 g/dL — ABNORMAL HIGH (ref 3.5–5.0)
Alkaline Phosphatase: 112 U/L (ref 38–126)
Anion gap: 13 (ref 5–15)
BUN: 19 mg/dL (ref 6–20)
CO2: 27 mmol/L (ref 22–32)
Calcium: 10.6 mg/dL — ABNORMAL HIGH (ref 8.9–10.3)
Chloride: 94 mmol/L — ABNORMAL LOW (ref 98–111)
Creatinine, Ser: 1.08 mg/dL (ref 0.61–1.24)
GFR, Estimated: >60 mL/min (ref >60)
Glucose, Bld: 111 mg/dL — ABNORMAL HIGH (ref 70–99)
Potassium: 4.2 mmol/L (ref 3.5–5.1)
Sodium: 134 mmol/L — ABNORMAL LOW (ref 135–145)
Total Bilirubin: 1 mg/dL (ref 0.3–1.2)
Total Protein: 9.2 g/dL — ABNORMAL HIGH (ref 6.5–8.1)

## 2021-11-16 LAB — CBC WITH DIFFERENTIAL/PLATELET
Abs Immature Granulocytes: 0.04 10*3/uL (ref 0.00–0.07)
Basophils Absolute: 0 10*3/uL (ref 0.0–0.1)
Basophils Relative: 0 %
Eosinophils Absolute: 0 10*3/uL (ref 0.0–0.5)
Eosinophils Relative: 0 %
HCT: 52 % (ref 39.0–52.0)
Hemoglobin: 17.8 g/dL — ABNORMAL HIGH (ref 13.0–17.0)
Immature Granulocytes: 0 %
Lymphocytes Relative: 13 %
Lymphs Abs: 1.5 10*3/uL (ref 0.7–4.0)
MCH: 29.7 pg (ref 26.0–34.0)
MCHC: 34.2 g/dL (ref 30.0–36.0)
MCV: 86.7 fL (ref 80.0–100.0)
Monocytes Absolute: 0.9 10*3/uL (ref 0.1–1.0)
Monocytes Relative: 8 %
Neutro Abs: 9 10*3/uL — ABNORMAL HIGH (ref 1.7–7.7)
Neutrophils Relative %: 79 %
Platelets: 282 10*3/uL (ref 150–400)
RBC: 6 MIL/uL — ABNORMAL HIGH (ref 4.22–5.81)
RDW: 13.1 % (ref 11.5–15.5)
WBC: 11.5 10*3/uL — ABNORMAL HIGH (ref 4.0–10.5)
nRBC: 0 % (ref 0.0–0.2)

## 2021-11-16 LAB — URINALYSIS, ROUTINE W REFLEX MICROSCOPIC
Bilirubin Urine: NEGATIVE
Glucose, UA: NEGATIVE mg/dL
Ketones, ur: 20 mg/dL — AB
Leukocytes,Ua: NEGATIVE
Nitrite: NEGATIVE
Protein, ur: 100 mg/dL — AB
Specific Gravity, Urine: 1.028 (ref 1.005–1.030)
pH: 5 (ref 5.0–8.0)

## 2021-11-16 LAB — LIPASE, BLOOD: Lipase: 39 U/L (ref 11–51)

## 2021-11-16 MED ORDER — ONDANSETRON 4 MG PO TBDP
ORAL_TABLET | ORAL | Status: AC
  Filled 2021-11-16: qty 1

## 2021-11-16 MED ORDER — ONDANSETRON 4 MG PO TBDP
4.0000 mg | ORAL_TABLET | Freq: Once | ORAL | Status: AC
  Administered 2021-11-16: 4 mg via ORAL

## 2021-11-16 NOTE — ED Triage Notes (Signed)
Pt presents with c/o loss of appetite and emesis that began Tuesday night.

## 2021-11-16 NOTE — ED Notes (Signed)
Patient called for vitals recheck. No response.  

## 2021-11-16 NOTE — ED Provider Triage Note (Signed)
Emergency Medicine Provider Triage Evaluation Note  Angel Golden , a 61 y.o. male  was evaluated in triage.  Pt complains of abdominal pain and indigestion over the last few days, with N/V/D.  No known contact with persons of similar symptoms.  Came from UC due to dehydration.  Denies fevers, back pain, chest pain, numbness or tingling.  Says sometimes he is short of breath with certain positions after all his nausea and vomiting.  Review of Systems  Positive:  Negative: See above  Physical Exam  BP (!) 167/123   Pulse (!) 112   Temp 97.8 F (36.6 C) (Oral)   Resp 19   Ht 5\' 11"  (1.803 m)   SpO2 100%   BMI 24.41 kg/m  Gen:   Awake, no distress appears clinically dehydrated Resp:  Normal effort, able to communicate without difficulty. MSK:   Moves extremities without difficulty  Other:  Extremities appear neurovascularly intact.  Mild generalized abdominal tenderness, soft.  Afebrile.  100% on room air.  Medical Decision Making  Medically screening exam initiated at 11:56 AM.  Appropriate orders placed.  Angel Golden was informed that the remainder of the evaluation will be completed by another provider, this initial triage assessment does not replace that evaluation, and the importance of remaining in the ED until their evaluation is complete.     Epifanio Lesches, PA-C 11/16/21 1201

## 2021-11-16 NOTE — ED Provider Notes (Signed)
[facility]    CSN: [id number] Arrival date & time: 11/16/21  0630      History   Chief Complaint Chief Complaint  Patient presents with   Emesis   Anorexia    HPI Angel Golden is a 61 y.o. male presenting with nausea, vomiting, diarrhea x2 days following 7/4 cookout. History hypertension, cirrhosis.  States multiple episodes of bilious vomiting and watery diarrhea 1 day ago, associated with minimal epigastric pain.  States no episodes of vomiting today, he is tolerating clear fluids and bland food.  He states his heart feels like it is racing, but there is no acute chest pain, shortness of breath.  Denies known sick contacts.  Denies antibiotics in the last month.  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 Authorizing Provider  amLODipine (NORVASC) 5 MG tablet Take 1 tablet (5 mg total) by mouth daily. 10/25/21   Lamptey, Britta Mccreedy, MD  hydrocortisone (ANUSOL-HC) 2.5 % rectal cream Place 1 application  rectally 2 (two) times daily. Patient would like his medicines mailed to him 10/25/21   Merrilee Jansky, MD  hydrocortisone (ANUSOL-HC) 25 MG suppository Place 1 suppository (25 mg total) rectally 2 (two) times daily. 10/25/21   Merrilee Jansky, MD  traZODone (DESYREL) 100 MG tablet Take 1 tablet (100 mg total) by mouth at bedtime as needed for sleep.  05/10/20   Money, Gerlene Burdock, FNP    Family History History reviewed. No pertinent family history.  Social History Social History   Tobacco Use   Smoking status: Some Days    Packs/day: 0.50    Types: Cigarettes   Smokeless tobacco: Never  Vaping Use   Vaping Use: Never used  Substance Use Topics   Alcohol use: Yes    Alcohol/week: 12.0 standard drinks of alcohol    Types: 12 Cans of beer per week   Drug use: Not Currently    Types: Cocaine, Marijuana     Allergies   Patient has no known allergies.   Review of Systems Review of Systems  Constitutional:  Negative for appetite change, chills, diaphoresis, fever and unexpected weight change.  HENT:  Negative for congestion, ear pain, sinus pressure, sinus pain, sneezing, sore throat and trouble swallowing.   Respiratory:  Negative for cough, chest tightness and shortness of breath.   Cardiovascular:  Negative for chest pain.  Gastrointestinal:  Positive for abdominal pain, diarrhea, nausea and vomiting. Negative for abdominal distention, anal bleeding, blood in stool, constipation and rectal pain.  Genitourinary:  Negative for dysuria, flank pain, frequency and urgency.  Musculoskeletal:  Negative for back pain and myalgias.  Neurological:  Negative for dizziness, light-headedness and headaches.  All other systems reviewed and are negative.    Physical Exam Triage Vital Signs ED Triage  Vitals  Enc Vitals Group     BP 11/16/21 0915 (!) 142/110     Pulse Rate 11/16/21 0915 (!) 116     Resp 11/16/21 0915 16     Temp 11/16/21 0915 98.1 F (36.7 C)     Temp Source 11/16/21 0915 Oral     SpO2 11/16/21 0915 96 %     Weight --      Height --      Head Circumference --      Peak Flow --      Pain Score 11/16/21 0914 2     Pain Loc --      Pain Edu? --      Excl. in GC? --    No data found.  Updated Vital Signs BP (!) 142/110 (BP Location: Right Arm)   Pulse (!) 116   Temp 98.1 F (36.7 C) (Oral)   Resp 16    SpO2 96%   Visual Acuity Right Eye Distance:   Left Eye Distance:   Bilateral Distance:    Right Eye Near:   Left Eye Near:    Bilateral Near:     Physical Exam Vitals reviewed.  Constitutional:      General: He is not in acute distress.    Appearance: Normal appearance. He is not ill-appearing.  HENT:     Head: Normocephalic and atraumatic.     Mouth/Throat:     Mouth: Mucous membranes are moist.     Comments: Moist mucous membranes Eyes:     Extraocular Movements: Extraocular movements intact.     Pupils: Pupils are equal, round, and reactive to light.  Cardiovascular:     Rate and Rhythm: Regular rhythm. Tachycardia present.     Pulses:          Radial pulses are 2+ on the right side and 2+ on the left side.     Heart sounds: Normal heart sounds.     Comments: Radial pulses equal and regular Pulmonary:     Effort: Pulmonary effort is normal.     Breath sounds: Normal breath sounds. No wheezing, rhonchi or rales.  Abdominal:     General: Bowel sounds are normal. There is no distension.     Palpations: Abdomen is soft. There is no mass.     Tenderness: There is no abdominal tenderness. There is no right CVA tenderness, left CVA tenderness, guarding or rebound.  Musculoskeletal:     Right lower leg: No edema.     Left lower leg: No edema.  Skin:    General: Skin is warm.     Capillary Refill: Capillary refill takes less than 2 seconds.     Comments: Good skin turgor  Neurological:     General: No focal deficit present.     Mental Status: He is alert and oriented to person, place, and time.  Psychiatric:        Mood and Affect: Mood normal.        Behavior: Behavior normal.      UC Treatments / Results  Labs (all labs ordered are listed, but only abnormal results are displayed) Labs Reviewed - No data to display  EKG   Radiology No results found.  Procedures Procedures (including critical care time)  Medications Ordered in UC Medications   ondansetron (ZOFRAN-ODT) disintegrating tablet 4 mg (4 mg Oral Given 11/16/21 5427)    Initial Impression / Assessment and Plan / UC Course  I have reviewed the triage vital signs  and the nursing notes.  Pertinent labs & imaging results that were available during my care of the patient were reviewed by me and considered in my medical decision making (see chart for details).     This patient is a very pleasant 61 y.o. year old male presenting with nausea, vomiting, diarrhea x2 days following attending a cookout. Afebrile but tachy ranging 110-116 on exam, pulses regular rate and rhythm. No improvement in the tachycardia following PO Challenge.  EKG with sinus tachy otherwise normal. Compared with 2021 EKG. Pt has baseline tremor, represented as artifact on EKG.   No episodes of vomiting today. Zofran ODT administered with improvement in nausea. PO challenge attempted; patient did not vomit, but states the abdominal pain is acutely worsening and becoming unbearable. Following discussion with patient, he will proceed for the ED for further intervention. Stable for transport in POV driven by friend.   Final Clinical Impressions(s) / UC Diagnoses   Final diagnoses:  Epigastric pain  Dehydration  Tachycardia     Discharge Instructions      -Please head to the ED for further management of your worsening abdominal pain and fast heart rate. You may need imaging of the belly and IV fluids, as well as longer-term monitoring than we can provide here.      ED Prescriptions   None    PDMP not reviewed this encounter.   Rhys Martini, PA-C 11/16/21 1018

## 2021-11-16 NOTE — ED Triage Notes (Signed)
Pt arrived POV from urgent care c/o being dehydrated. Pt states he has generalized abdominal pain that started 2 days ago. Pt endorses N/V and denies diarrhea.

## 2021-11-16 NOTE — ED Notes (Addendum)
Patient called for vitals recheck. No response.  

## 2021-11-16 NOTE — Discharge Instructions (Addendum)
-  Please head to the ED for further management of your worsening abdominal pain and fast heart rate. You may need imaging of the belly and IV fluids, as well as longer-term monitoring than we can provide here.

## 2021-12-03 ENCOUNTER — Other Ambulatory Visit: Payer: Self-pay

## 2021-12-03 ENCOUNTER — Emergency Department (HOSPITAL_COMMUNITY): Payer: 59

## 2021-12-03 ENCOUNTER — Encounter (HOSPITAL_COMMUNITY): Payer: Self-pay

## 2021-12-03 ENCOUNTER — Observation Stay (HOSPITAL_COMMUNITY)
Admission: EM | Admit: 2021-12-03 | Discharge: 2021-12-04 | Disposition: A | Discharge status: Home / Self Care | Payer: 59 | Attending: Emergency Medicine | Admitting: Emergency Medicine

## 2021-12-03 DIAGNOSIS — F1920 Other psychoactive substance dependence, uncomplicated: Secondary | ICD-10-CM | POA: Diagnosis present

## 2021-12-03 DIAGNOSIS — R1032 Left lower quadrant pain: Secondary | ICD-10-CM

## 2021-12-03 DIAGNOSIS — R079 Chest pain, unspecified: Secondary | ICD-10-CM | POA: Diagnosis not present

## 2021-12-03 DIAGNOSIS — I10 Essential (primary) hypertension: Secondary | ICD-10-CM

## 2021-12-03 DIAGNOSIS — R0600 Dyspnea, unspecified: Secondary | ICD-10-CM | POA: Diagnosis not present

## 2021-12-03 DIAGNOSIS — Z743 Need for continuous supervision: Secondary | ICD-10-CM | POA: Diagnosis not present

## 2021-12-03 DIAGNOSIS — R1084 Generalized abdominal pain: Secondary | ICD-10-CM | POA: Diagnosis not present

## 2021-12-03 DIAGNOSIS — I7143 Infrarenal abdominal aortic aneurysm, without rupture: Principal | ICD-10-CM | POA: Diagnosis present

## 2021-12-03 DIAGNOSIS — F17210 Nicotine dependence, cigarettes, uncomplicated: Secondary | ICD-10-CM | POA: Insufficient documentation

## 2021-12-03 DIAGNOSIS — I714 Abdominal aortic aneurysm, without rupture, unspecified: Secondary | ICD-10-CM | POA: Diagnosis not present

## 2021-12-03 DIAGNOSIS — R112 Nausea with vomiting, unspecified: Secondary | ICD-10-CM | POA: Diagnosis not present

## 2021-12-03 DIAGNOSIS — Z79899 Other long term (current) drug therapy: Secondary | ICD-10-CM | POA: Diagnosis not present

## 2021-12-03 DIAGNOSIS — N3289 Other specified disorders of bladder: Secondary | ICD-10-CM | POA: Diagnosis not present

## 2021-12-03 DIAGNOSIS — R69 Illness, unspecified: Secondary | ICD-10-CM | POA: Diagnosis not present

## 2021-12-03 LAB — COMPREHENSIVE METABOLIC PANEL
ALT: 21 U/L (ref 0–44)
AST: 29 U/L (ref 15–41)
Albumin: 3.9 g/dL (ref 3.5–5.0)
Alkaline Phosphatase: 95 U/L (ref 38–126)
Anion gap: 9 (ref 5–15)
BUN: 6 mg/dL (ref 6–20)
CO2: 25 mmol/L (ref 22–32)
Calcium: 9.8 mg/dL (ref 8.9–10.3)
Chloride: 104 mmol/L (ref 98–111)
Creatinine, Ser: 0.93 mg/dL (ref 0.61–1.24)
GFR, Estimated: >60 mL/min (ref >60)
Glucose, Bld: 105 mg/dL — ABNORMAL HIGH (ref 70–99)
Potassium: 3.7 mmol/L (ref 3.5–5.1)
Sodium: 138 mmol/L (ref 135–145)
Total Bilirubin: 0.9 mg/dL (ref 0.3–1.2)
Total Protein: 7.2 g/dL (ref 6.5–8.1)

## 2021-12-03 LAB — PROTIME-INR
INR: 1.1 (ref 0.8–1.2)
Prothrombin Time: 13.8 seconds (ref 11.4–15.2)

## 2021-12-03 LAB — TYPE AND SCREEN
ABO/RH(D): A POS
Antibody Screen: NEGATIVE

## 2021-12-03 LAB — CBC WITH DIFFERENTIAL/PLATELET
Abs Immature Granulocytes: 0.02 10*3/uL (ref 0.00–0.07)
Basophils Absolute: 0 10*3/uL (ref 0.0–0.1)
Basophils Relative: 0 %
Eosinophils Absolute: 0 10*3/uL (ref 0.0–0.5)
Eosinophils Relative: 1 %
HCT: 42.8 % (ref 39.0–52.0)
Hemoglobin: 14.7 g/dL (ref 13.0–17.0)
Immature Granulocytes: 0 %
Lymphocytes Relative: 18 %
Lymphs Abs: 1.2 10*3/uL (ref 0.7–4.0)
MCH: 30 pg (ref 26.0–34.0)
MCHC: 34.3 g/dL (ref 30.0–36.0)
MCV: 87.3 fL (ref 80.0–100.0)
Monocytes Absolute: 0.4 10*3/uL (ref 0.1–1.0)
Monocytes Relative: 6 %
Neutro Abs: 4.9 10*3/uL (ref 1.7–7.7)
Neutrophils Relative %: 75 %
Platelets: 222 10*3/uL (ref 150–400)
RBC: 4.9 MIL/uL (ref 4.22–5.81)
RDW: 13.3 % (ref 11.5–15.5)
WBC: 6.5 10*3/uL (ref 4.0–10.5)
nRBC: 0 % (ref 0.0–0.2)

## 2021-12-03 LAB — URINALYSIS, ROUTINE W REFLEX MICROSCOPIC
Bacteria, UA: NONE SEEN
Bilirubin Urine: NEGATIVE
Glucose, UA: NEGATIVE mg/dL
Hgb urine dipstick: NEGATIVE
Ketones, ur: 20 mg/dL — AB
Leukocytes,Ua: NEGATIVE
Nitrite: NEGATIVE
Protein, ur: 30 mg/dL — AB
Specific Gravity, Urine: 1.025 (ref 1.005–1.030)
pH: 5 (ref 5.0–8.0)

## 2021-12-03 LAB — LIPASE, BLOOD: Lipase: 33 U/L (ref 11–51)

## 2021-12-03 LAB — RAPID URINE DRUG SCREEN, HOSP PERFORMED
Amphetamines: NOT DETECTED
Barbiturates: NOT DETECTED
Benzodiazepines: NOT DETECTED
Cocaine: POSITIVE — AB
Opiates: NOT DETECTED
Tetrahydrocannabinol: POSITIVE — AB

## 2021-12-03 LAB — TROPONIN I (HIGH SENSITIVITY)
Troponin I (High Sensitivity): 7 ng/L (ref <18)
Troponin I (High Sensitivity): 6 ng/L (ref <18)
Troponin I (High Sensitivity): 5 ng/L (ref <18)

## 2021-12-03 LAB — BRAIN NATRIURETIC PEPTIDE: B Natriuretic Peptide: 15.5 pg/mL (ref 0.0–100.0)

## 2021-12-03 LAB — ABO/RH: ABO/RH(D): A POS

## 2021-12-03 MED ORDER — SODIUM CHLORIDE 0.9 % IV BOLUS
1000.0000 mL | Freq: Once | INTRAVENOUS | Status: AC
  Administered 2021-12-03: 1000 mL via INTRAVENOUS

## 2021-12-03 MED ORDER — OXYCODONE-ACETAMINOPHEN 5-325 MG PO TABS
1.0000 | ORAL_TABLET | Freq: Once | ORAL | Status: AC
  Administered 2021-12-03: 1 via ORAL
  Filled 2021-12-03: qty 1

## 2021-12-03 MED ORDER — HYDROMORPHONE HCL 1 MG/ML IJ SOLN
1.0000 mg | Freq: Once | INTRAMUSCULAR | Status: AC
  Administered 2021-12-03: 1 mg via INTRAVENOUS
  Filled 2021-12-03: qty 1

## 2021-12-03 MED ORDER — PROCHLORPERAZINE EDISYLATE 10 MG/2ML IJ SOLN
10.0000 mg | Freq: Once | INTRAMUSCULAR | Status: AC
  Administered 2021-12-03: 10 mg via INTRAVENOUS
  Filled 2021-12-03: qty 2

## 2021-12-03 MED ORDER — ONDANSETRON 4 MG PO TBDP
4.0000 mg | ORAL_TABLET | Freq: Once | ORAL | Status: AC
  Administered 2021-12-03: 4 mg via ORAL
  Filled 2021-12-03: qty 1

## 2021-12-03 NOTE — ED Notes (Signed)
MD at bedside and aware of elevated BP.

## 2021-12-03 NOTE — ED Notes (Addendum)
Pt reporting severe worsening pain, triage RN ad PA notified. Triage RN/PA said pt has already had an antiemetic and pain medicine will not be given at this time due to patient status as in patient lobby.

## 2021-12-03 NOTE — ED Notes (Signed)
Pt and family are becoming increasingly more hostile towards lobby staff. Pt stating he is going to die here and that "no one here understands."

## 2021-12-03 NOTE — ED Provider Notes (Signed)
[facility] EMERGENCY DEPARTMENT Provider Note   CSN: [id number] Arrival date & time: 12/03/21  1509     History  Chief Complaint  Patient presents with   Abdominal Pain    Angel Golden is a 61 y.o. male.  HPI  Medical history including hep C, cirrhosis, hypertension, presents emerged for complaints of abdominal pain, been going on for last week, pain is intermittent, patient states this bout of pain started today, remains in his lower abdomen, worse on the left versus the right, associated nausea and vomiting, with slight blood tinge emesis, denies coffee-ground emesis, still passing gas having normal bowel movements, last bowel movement was today, denies melena or hematochezia, denies any urinary symptoms.  Admits to chills, but no fever, admits to marijuana use but denies excessive alcohol use or NSAID use, no abdominal surgeries, he has never had this in the past.  He also notes that he is having some rectal pain, states that he has history of hemorrhoids and feels like this is flaring up.     Home Medications Prior to Admission medications   Medication Sig Start Date End Date Taking? Authorizing Provider  amLODipine (NORVASC) 5 MG tablet Take 1 tablet (5 mg total) by mouth daily. 10/25/21  Yes Lamptey, Myrene Galas, MD  traZODone (DESYREL) 100 MG tablet Take 1 tablet (100 mg total) by mouth at bedtime as needed for sleep. 05/10/20  Yes Money, Lowry Ram, FNP  hydrocortisone (ANUSOL-HC) 2.5 % rectal cream Place 1 application  rectally 2 (two) times daily. Patient would like his medicines mailed to him 10/25/21   Chase Picket, MD  hydrocortisone (ANUSOL-HC) 25 MG suppository Place 1 suppository (25 mg total) rectally 2 (two) times daily. 10/25/21   Lamptey, Myrene Galas, MD      Allergies    Patient has no known allergies.    Review of Systems   Review of Systems  Physical Exam Updated Vital Signs BP (!) 131/92 (BP Location: Left Arm)   Pulse 74   Temp 98.1 F  (36.7 C) (Oral)   Resp 16   Ht $R'5\' 11"'ch$  (1.803 m)   Wt 79.4 kg   SpO2 100%   BMI 24.41 kg/m  Physical Exam Vitals and nursing note reviewed.  Constitutional:      General: He is not in acute distress.    Appearance: He is not ill-appearing.  HENT:     Head: Normocephalic and atraumatic.     Nose: No congestion.  Eyes:     Conjunctiva/sclera: Conjunctivae normal.  Cardiovascular:     Rate and Rhythm: Normal rate and regular rhythm.     Pulses: Normal pulses.     Heart sounds: No murmur heard.    No friction rub. No gallop.  Pulmonary:     Effort: No respiratory distress.     Breath sounds: No wheezing, rhonchi or rales.  Abdominal:     Tenderness: There is abdominal tenderness. There is no right CVA tenderness or left CVA tenderness.     Comments: Nondistended, soft, tenderness noted in the left lower quadrant, without guarding rebound tension peritoneal sign no flank tenderness no CVA tenderness.  Musculoskeletal:     Right lower leg: No edema.     Left lower leg: No edema.  Skin:    General: Skin is warm and dry.  Neurological:     Mental Status: He is alert.     Comments: No facial asymmetry no difficulty word finding following two-step commands no  without weakness present.  Psychiatric:        Mood and Affect: Mood normal.     ED Results / Procedures / Treatments   Labs (all labs ordered are listed, but only abnormal results are displayed) Labs Reviewed  COMPREHENSIVE METABOLIC PANEL - Abnormal; Notable for the following components:      Result Value   Glucose, Bld 105 (*)    All other components within normal limits  URINALYSIS, ROUTINE W REFLEX MICROSCOPIC - Abnormal; Notable for the following components:   Color, Urine AMBER (*)    APPearance HAZY (*)    Ketones, ur 20 (*)    Protein, ur 30 (*)    All other components within normal limits  RAPID URINE DRUG SCREEN, HOSP PERFORMED - Abnormal; Notable for the following components:   Cocaine POSITIVE (*)     Tetrahydrocannabinol POSITIVE (*)    All other components within normal limits  CBC WITH DIFFERENTIAL/PLATELET  LIPASE, BLOOD  BRAIN NATRIURETIC PEPTIDE  PROTIME-INR  HIV ANTIBODY (ROUTINE TESTING W REFLEX)  LIPID PANEL  HEMOGLOBIN Q2E  BASIC METABOLIC PANEL  TYPE AND SCREEN  ABO/RH  TROPONIN I (HIGH SENSITIVITY)  TROPONIN I (HIGH SENSITIVITY)  TROPONIN I (HIGH SENSITIVITY)    EKG EKG Interpretation  Date/Time:  Sunday December 03 2021 17:14:16 EDT Ventricular Rate:  77 PR Interval:  150 QRS Duration: 84 QT Interval:  382 QTC Calculation: 432 R Axis:   83 Text Interpretation: Normal sinus rhythm with sinus arrhythmia Minimal voltage criteria for LVH, may be normal variant ( Sokolow-Lyon ) Borderline ECG When compared with ECG of 16-Nov-2021 11:00, PREVIOUS ECG IS PRESENT Confirmed by Malvin Johns [phone]) on 12/03/2021 10:51:03 PM  Radiology CT ABDOMEN PELVIS W CONTRAST  Result Date: 12/04/2021 CLINICAL DATA:  Left lower quadrant abdominal pain. EXAM: CT ABDOMEN AND PELVIS WITH CONTRAST TECHNIQUE: Multidetector CT imaging of the abdomen and pelvis was performed using the standard protocol following bolus administration of intravenous contrast. RADIATION DOSE REDUCTION: This exam was performed according to the departmental dose-optimization program which includes automated exposure control, adjustment of the mA and/or kV according to patient size and/or use of iterative reconstruction technique. CONTRAST:  170m OMNIPAQUE IOHEXOL 300 MG/ML  SOLN COMPARISON:  August 22, 2003 FINDINGS: Lower chest: No acute abnormality. Hepatobiliary: No focal liver abnormality is seen. No gallstones, gallbladder Rimel thickening, or biliary dilatation. Pancreas: Unremarkable. No pancreatic ductal dilatation or surrounding inflammatory changes. Spleen: Normal in size without focal abnormality. Adrenals/Urinary Tract: Adrenal glands are unremarkable. Kidneys are normal in size, without renal calculi or  hydronephrosis. A 5 mm diameter cystic lesion is seen within the posterior aspect of the mid left kidney. Bladder is unremarkable. Stomach/Bowel: Stomach is within normal limits. Appendix appears normal. No evidence of bowel Sawhney thickening, distention, or inflammatory changes. Vascular/Lymphatic: Aortic atherosclerosis with 6.0 cm x 6.1 cm aneurysmal dilatation of the infrarenal abdominal aorta. Numerous dilated and tortuous vessels are seen within the mid and upper left abdomen. Smaller tortuous vessels are seen extending along the posterior aspect of the left lower quadrant and left hemipelvis. No enlarged abdominal or pelvic lymph nodes. Reproductive: The prostate gland is mildly enlarged. Other: No abdominal Treptow hernia or abnormality. No abdominopelvic ascites. Musculoskeletal: Marked severity anterior osteophyte formation is seen at the level of L5-S1. IMPRESSION: 1. 6.0 cm x 6.1 cm infrarenal abdominal aortic aneurysm. Recommend referral to a vascular specialist. Reference: J Am Coll Radiol 20051;10:211-173 Aortic Atherosclerosis (ICD10-I70.0). Electronically Signed   By: TJoyce GrossD.  On: 12/04/2021 00:20   DG Chest 2 View  Result Date: 12/03/2021 CLINICAL DATA:  Chest pain. EXAM: CHEST - 2 VIEW COMPARISON:  11/16/2021. FINDINGS: Normal heart, mediastinum and hila. Clear lungs.  No pleural effusion or pneumothorax. Skeletal structures are intact. IMPRESSION: No active cardiopulmonary disease. Electronically Signed   By: Lajean Manes M.D.   On: 12/03/2021 18:08    Procedures Procedures    Medications Ordered in ED Medications  amLODipine (NORVASC) tablet 5 mg (has no administration in time range)  ondansetron (ZOFRAN-ODT) disintegrating tablet 4 mg (4 mg Oral Given 12/03/21 1548)  oxyCODONE-acetaminophen (PERCOCET/ROXICET) 5-325 MG per tablet 1 tablet (1 tablet Oral Given 12/03/21 1607)  sodium chloride 0.9 % bolus 1,000 mL (0 mLs Intravenous Stopped 12/03/21 2345)  prochlorperazine  (COMPAZINE) injection 10 mg (10 mg Intravenous Given 12/03/21 2253)  HYDROmorphone (DILAUDID) injection 1 mg (1 mg Intravenous Given 12/03/21 2252)  iohexol (OMNIPAQUE) 300 MG/ML solution 100 mL (100 mLs Intravenous Contrast Given 12/04/21 0010)    ED Course/ Medical Decision Making/ A&P                           Medical Decision Making Amount and/or Complexity of Data Reviewed Labs: ordered. Radiology: ordered.  Risk Prescription drug management. Decision regarding hospitalization.   This patient presents to the ED for concern of abdominal pain nausea vomiting, this involves an extensive number of treatment options, and is a complaint that carries with it a high risk of complications and morbidity.  The differential diagnosis includes bowel obstruction, diverticulitis, volvulus, gastroenteritis    Additional history obtained:  Additional history obtained from family at bedside External records from outside source obtained and reviewed including urgent care note   Co morbidities that complicate the patient evaluation  Marijuana use, hypertension  Social Determinants of Health:  N/A    Lab Tests:  I Ordered, and personally interpreted labs.  The pertinent results include: CBC unremarkable, CMP shows glucose of 105, lipase is 33, BNP is 15.5, negative delta troponin, UA is unremarkable, INR prothrombin time unremarkable, rapid urine drug screen positive for cocaine as well as cannabinoids.   Imaging Studies ordered:  I ordered imaging studies including chest x-ray, CT on pelvis I independently visualized and interpreted imaging which showed negative, shows 6 cm infrarenal aortic aneurysm. I agree with the radiologist interpretation   Cardiac Monitoring:  The patient was maintained on a cardiac monitor.  I personally viewed and interpreted the cardiac monitored which showed an underlying rhythm of: Without signs of ischemia   Medicines ordered and prescription drug  management:  I ordered medication including fluids, antiemetics, pain medication I have reviewed the patients home medicines and have made adjustments as needed  Critical Interventions:  N/A   Reevaluation:  Presents with abdominal pain, triage obtain lab work and imaging which I personally reviewed they are unremarkable, he has notable lower quadrant tenderness, concern for possible diverticulitis, will obtain CT imaging for further evaluation provide with pain meds fluids antiemetics and reassess.    Patient was reassessed after pain medication nausea medications, states he is feeling much better, he has no complaints, reassess his abdomen is soft nontender, discussed CT imaging, he states he was unaware of this, will consult with vascular surgery for further recommendations.  Updated patient recommendations from vascular surgery, he is agreement this plan, will admit to medicine.      Consultations Obtained:  I requested consultation with the [facility] vascular surgery,  and discussed lab and imaging findings as well as pertinent plan - they recommend: Admit for observation, will see patient this morning for reevaluation.  If pain comes back patient would undergo immediate fixation of aneurysm, and he would like to be notified immediately if pain comes back. Spoke with Dr. Lorrin Goodell lilland of the hospitalist team will admit the patient.    Test Considered:      Rule out  I have low suspicion for liver or gallbladder abnormality as she has no right upper quadrant tenderness, liver enzymes, alk phos, T bili all within normal limits.  Low suspicion for pancreatitis as lipase is within normal limits.  Low suspicion for ruptured stomach ulcer as she has no peritoneal sign present on exam.  Low suspicion for bowel obstruction as abdomen is nondistended normal bowel sounds, so passing gas and having normal bowel movements.  Low suspicion for complicated diverticulitis as she is  nontoxic-appearing, vital signs reassuring no leukocytosis present.  Low suspicion for UTI, pyelo-, kidney stone endorse any urinary symptoms, UA is unremarkable.  I have low suspicion for  Dispostion and problem list  After consideration of the diagnostic results and the patients response to treatment, I feel that the patent would benefit from admission.  Abdominal pain-unclear etiology, I suspect likely from gastritis from nausea vomiting from cannabis use, but he has a 6 cm abdominal aneurysm which could also be causing his pain.  Recommend patient remains n.p.o. until evaluated by vascular surgery.             Final Clinical Impression(s) / ED Diagnoses Final diagnoses:  Infrarenal abdominal aortic aneurysm (AAA) without rupture ([place])  Left lower quadrant abdominal pain    Rx / DC Orders ED Discharge Orders     None         Marcello Fennel, PA-C 12/04/21 0329    Jeanell Sparrow, DO 12/06/21 1617

## 2021-12-03 NOTE — ED Notes (Signed)
Patient re-evaluated by PA.

## 2021-12-03 NOTE — ED Notes (Signed)
Patient reports hematemesis while at waiting area , will reevaluate patient at triage rm. 4 by PA .

## 2021-12-03 NOTE — ED Triage Notes (Signed)
Generalized abd pain that has been going on for over a week and was seen last week for same and was givne fluids and then sent home.  Reports he hasnt been able to keep anything down today.

## 2021-12-03 NOTE — ED Provider Triage Note (Cosign Needed Addendum)
Emergency Medicine Provider Triage Evaluation Note  Angel Golden , Golden 61 y.o. male  was evaluated in triage.  Pt complains of generalized abdominal pain onset this morning.  Has associated nausea, vomiting.  No meds tried prior to arrival. Denies chest pain, shortness of breath, fever, urinary symptoms.   Review of Systems  Positive: As per HPI Negative:   Physical Exam  BP (!) 174/114 (BP Location: Right Arm)   Pulse 77   Temp 97.7 F (36.5 C) (Oral)   Resp 16   Ht 5\' 11"  (1.803 m)   Wt 79.4 kg   SpO2 98%   BMI 24.41 kg/m  Gen:   Awake, no distress   Resp:  Normal effort  MSK:   Moves extremities without difficulty  Other:  Diffuse abdominal tenderness to palpation.  Medical Decision Making  Medically screening exam initiated at 3:09 PM.  Appropriate orders placed.  Angel Golden was informed that the remainder of the evaluation will be completed by another provider, this initial triage assessment does not replace that evaluation, and the importance of remaining in the ED until their evaluation is complete.  Work-up initiated   Angel Loria A, PA-C 12/03/21 1515   8:13 PM -notified by staff out in the lobby that patient had an episode of hematemesis in the lobby. Pt re-evaluated and noted that he had an episode of emesis in the waiting room that was yellow/green and at the end of it he noticed what seemed to be clots of blood.  Patient reassessed on physical exam, no gross blood noted in the oropharyngeal space.  Uvula midline without swelling.  Patient also noted frustrations with waiting for Golden room and how long he has been in the emergency department.  Discussed with patient that he is in line to have Golden room.  Patient notes that he would like water at this time, spoke with patient that if he receives water it is likely that he may throw it back up due to his recent episode of emesis.  Patient noted that he would like ice chips instead.  We will order coags and type and screen and  patient given ice chips.   Darral Rishel A, PA-C 12/03/21 2022

## 2021-12-04 ENCOUNTER — Telehealth: Payer: Self-pay | Admitting: Vascular Surgery

## 2021-12-04 DIAGNOSIS — N3289 Other specified disorders of bladder: Secondary | ICD-10-CM | POA: Diagnosis not present

## 2021-12-04 DIAGNOSIS — I714 Abdominal aortic aneurysm, without rupture, unspecified: Secondary | ICD-10-CM

## 2021-12-04 DIAGNOSIS — I7143 Infrarenal abdominal aortic aneurysm, without rupture: Secondary | ICD-10-CM

## 2021-12-04 DIAGNOSIS — F1920 Other psychoactive substance dependence, uncomplicated: Secondary | ICD-10-CM | POA: Diagnosis not present

## 2021-12-04 DIAGNOSIS — I10 Essential (primary) hypertension: Secondary | ICD-10-CM

## 2021-12-04 DIAGNOSIS — R1032 Left lower quadrant pain: Secondary | ICD-10-CM | POA: Diagnosis not present

## 2021-12-04 DIAGNOSIS — R69 Illness, unspecified: Secondary | ICD-10-CM | POA: Diagnosis not present

## 2021-12-04 LAB — HEMOGLOBIN A1C
Hgb A1c MFr Bld: 5.7 % — ABNORMAL HIGH (ref 4.8–5.6)
Mean Plasma Glucose: 116.89 mg/dL

## 2021-12-04 LAB — BASIC METABOLIC PANEL
Anion gap: 8 (ref 5–15)
BUN: 6 mg/dL (ref 6–20)
CO2: 26 mmol/L (ref 22–32)
Calcium: 9.1 mg/dL (ref 8.9–10.3)
Chloride: 103 mmol/L (ref 98–111)
Creatinine, Ser: 0.84 mg/dL (ref 0.61–1.24)
GFR, Estimated: >60 mL/min (ref >60)
Glucose, Bld: 111 mg/dL — ABNORMAL HIGH (ref 70–99)
Potassium: 3.6 mmol/L (ref 3.5–5.1)
Sodium: 137 mmol/L (ref 135–145)

## 2021-12-04 LAB — LIPID PANEL
Cholesterol: 225 mg/dL — ABNORMAL HIGH (ref 0–200)
HDL: 54 mg/dL (ref >40)
LDL Cholesterol: 158 mg/dL — ABNORMAL HIGH (ref 0–99)
Total CHOL/HDL Ratio: 4.2 RATIO
Triglycerides: 67 mg/dL (ref <150)
VLDL: 13 mg/dL (ref 0–40)

## 2021-12-04 LAB — HIV ANTIBODY (ROUTINE TESTING W REFLEX): HIV Screen 4th Generation wRfx: NONREACTIVE

## 2021-12-04 MED ORDER — IOHEXOL 300 MG/ML  SOLN
100.0000 mL | Freq: Once | INTRAMUSCULAR | Status: AC | PRN
  Administered 2021-12-04: 100 mL via INTRAVENOUS

## 2021-12-04 MED ORDER — ATORVASTATIN CALCIUM 40 MG PO TABS
40.0000 mg | ORAL_TABLET | Freq: Every day | ORAL | Status: DC
  Administered 2021-12-04: 40 mg via ORAL
  Filled 2021-12-04: qty 1

## 2021-12-04 MED ORDER — AMLODIPINE BESYLATE 5 MG PO TABS
5.0000 mg | ORAL_TABLET | Freq: Every day | ORAL | Status: DC
  Administered 2021-12-04: 5 mg via ORAL
  Filled 2021-12-04: qty 1

## 2021-12-04 MED ORDER — ATORVASTATIN CALCIUM 40 MG PO TABS: 40.0000 mg | ORAL_TABLET | Freq: Every day | ORAL | 1 refills | Status: AC

## 2021-12-04 MED ORDER — LABETALOL HCL 5 MG/ML IV SOLN
10.0000 mg | INTRAVENOUS | Status: DC | PRN
Start: 2021-12-04 — End: 2021-12-04

## 2021-12-04 NOTE — Progress Notes (Signed)
     Daily Progress Note Intern Pager: [phone]  Patient name: Angel Golden Medical record number: [id number] Date of birth: Jul 01, 1960 Age: 61 y.o. Gender: male  Primary Care Provider: [facility] Consultants: Vascular Surgery Code Status: Full  Pt Overview and Major Events to Date:  -12/04/21 Admitted  Assessment and Plan:  Angel Golden is a 61 y.o. male presenting with severe abdominal pain with nausea and vomiting, found to have a AAA. Pertinent PMH/PSH includes HTN.   * Infrarenal abdominal aortic aneurysm (AAA) without rupture (HCC) Newly identified 6.0 cm x 6.1 cm.  Vascular surgeon consulted and and recommended outpatient follow-up for surgery.  Pain is likely cocaine induced vasospasm.  Comfortable, stable currently with good response to pain medication in ED. -Vascular surgery consulted, appreciate recommendations -Continuous cardiac monitoring x24 hours -Vital signs per unit with pulse ox check -Heart healthy diet  Hypertension Elevated up to high of 208/122 but improved to 131/92.  Likely related to pain.  Home medications include amlodipine 5 mg daily.  Blood pressure resolved with pain reduction.  LDL 158.  A1c 5.7 - As needed labetalol for pressure control -Resume home amlodipine -Monitor blood pressure -Start atorvastatin 40 mg daily  Polysubstance dependence (HCC) Cocaine and alcohol use several days ago.  UDS positive for THC and cocaine.  No history of alcohol withdrawal. -CIWA's every 4 hours, notify FM team if greater than 4       FEN/GI: Regular PPx: SCDs Dispo: Pending clinical improvement  Subjective:  No new events since admission.  Pain is resolved with medication.  No new chest pain shortness of breath nausea or vomiting.  Objective: Temp:  [97.7 F (36.5 C)-98.5 F (36.9 C)] 98 F (36.7 C) (07/24 0816) Pulse Rate:  [68-79] 79 (07/24 1034) Resp:  [14-22] 17 (07/24 1200) BP: (106-208)/(78-140) 106/82 (07/24 1200) SpO2:  [98  %-100 %] 100 % (07/24 1034) Weight:  [79.4 kg] 79.4 kg (07/23 1511) Physical Exam: General: Well appearing male lying in hospital bed Cardiovascular: Regular rate and rhythm, no murmurs appreciated Respiratory: Normal work of breathing, clear to auscultation bilaterally Abdomen: Soft nontender, no rebound or guarding Extremities: Warm, well-perfused no peripheral edema  Laboratory: Most recent CBC Lab Results  Component Value Date   WBC 6.5 12/03/2021   HGB 14.7 12/03/2021   HCT 42.8 12/03/2021   MCV 87.3 12/03/2021   PLT 222 12/03/2021   Most recent BMP    Latest Ref Rng & Units 12/04/2021    4:45 AM  BMP  Glucose 70 - 99 mg/dL 144   BUN 6 - 20 mg/dL 6   Creatinine 3.15 - 4.00 mg/dL 8.67   Sodium 619 - 509 mmol/L 137   Potassium 3.5 - 5.1 mmol/L 3.6   Chloride 98 - 111 mmol/L 103   CO2 22 - 32 mmol/L 26   Calcium 8.9 - 10.3 mg/dL 9.1     Other pertinent labs A1c 5.7, LDL 158   Imaging/Diagnostic Tests: Radiologist Impression:   CT ABDOMEN PELVIS W CONTRAST 12/04/21 IMPRESSION: 1. 6.0 cm x 6.1 cm infrarenal abdominal aortic aneurysm. Recommend referral to a vascular specialist   Celine Mans, MD 12/04/2021, 1:00 PM  PGY-1, [facility] Family [facility] FPTS Intern pager: [phone], text pages welcome Secure chat group [facility] Teaching Service

## 2021-12-04 NOTE — ED Notes (Signed)
Angel Golden [place], sister - is going home for the evening and would like a phone call if she is needed.  Home - [phone] Cell - [phone]

## 2021-12-04 NOTE — Discharge Summary (Addendum)
Family Medicine Teaching [facility] Discharge Summary  Patient name: Angel Golden Medical record number: [id number] Date of birth: 07-24-1960 Age: 61 y.o. Gender: male Date of Admission: 12/03/2021  Date of Discharge: 12/04/2021 Admitting Physician: Shelby Mattocks, DO  Primary Care Provider: [facility] Consultants: Vascular surgery  Indication for Hospitalization: Nausea and vomiting  Brief Hospital Course:  Angel Golden is a 61 y.o.male with a history of hypertension who was admitted to the family medicine teaching Service at [facility] for abdominal pain and vomiting. His problem based hospital course is detailed below:   Infrarenal abdominal aortic aneurysm without rupture Newly identified 6 cm abdominal aortic aneurysm found on CT abdomen pelvis.  Vascular surgery was consulted and recommended outpatient elective surgery, and cardiology clearance for surgery.  Hypertension Blood pressure elevated to high of 208/122 in the emergency room.  Improved to 131/92 with pain medication.  LDL elevated to 158, A1c 5.7.  Patient discharged on home blood pressure regimen amlodipine 5 mg daily, and atorvastatin 40 mg.  Polysubstance dependence UDS was positive for cocaine in emergency department.  Transitions of care was consulted.  Patient was counseled on cocaine cessation and important with stress in the setting of new abdominal aortic aneurysm.  Other chronic conditions were medically managed with home medications and formulary alternatives as necessary   PCP Follow-up Recommendations: Substance abuse: Patient cocaine positive on admission with severe hypertension. Given large AAA, needs immediate cocaine cessation and tight blood pressure control.  AAA 6.0 cm: Cardiology and vascular surgery appointments scheduled. Hypertension: Discharged on amlodipine 5 mg daily Cholesterol: LDL 158, 10-year ASCVD risk 24%. Started high intensity statin with atorvastatin 40 mg. PCP to  recheck and adjust as indicated.    Disposition: Home  Discharge Condition: Stable  Discharge Exam:  Vitals:   12/04/21 1345 12/04/21 1346  BP: (!) 134/99   Pulse: 85   Resp: (!) 23   Temp:  98.6 F (37 C)  SpO2: 100%    General: Well appearing male lying in hospital bed Cardiovascular: Regular rate and rhythm, no murmurs appreciated Respiratory: Normal work of breathing, clear to auscultation bilaterally Abdomen: Soft nontender, no rebound or guarding Extremities: Warm, well-perfused no peripheral edema  Significant Procedures: None  Significant Labs and Imaging:  No results for input(s): "WBC", "HGB", "HCT", "PLT" in the last 48 hours.  No results for input(s): "NA", "K", "CL", "CO2", "GLUCOSE", "BUN", "CREATININE", "CALCIUM", "MG", "PHOS", "ALKPHOS", "AST", "ALT", "ALBUMIN", "PROTEIN" in the last 48 hours.   Pertinent Imaging:  CT ABDOMEN PELVIS W CONTRAST   IMPRESSION: 1. 6.0 cm x 6.1 cm infrarenal abdominal aortic aneurysm. Recommend referral to a vascular specialist. Reference: J Am Coll Radiol 2013;10:789-794.   Aortic Atherosclerosis (ICD10-I70.0).   Results/Tests Pending at Time of Discharge: None  Discharge Medications:  Allergies as of 12/04/2021   No Known Allergies      Medication List     STOP taking these medications    ibuprofen 200 MG tablet Commonly known as: ADVIL       TAKE these medications    amLODipine 5 MG tablet Commonly known as: NORVASC Take 1 tablet (5 mg total) by mouth daily.   atorvastatin 40 MG tablet Commonly known as: LIPITOR Take 1 tablet (40 mg total) by mouth daily.   B-12 PO Take 1 tablet by mouth daily.   hydrocortisone 2.5 % rectal cream Commonly known as: ANUSOL-HC Place 1 application  rectally 2 (two) times daily. Patient would like his medicines mailed  to him What changed:  when to take this reasons to take this   hydrocortisone 25 MG suppository Commonly known as: ANUSOL-HC Place 1 suppository  (25 mg total) rectally 2 (two) times daily. What changed:  when to take this reasons to take this   traZODone 100 MG tablet Commonly known as: DESYREL Take 1 tablet (100 mg total) by mouth at bedtime as needed for sleep.   witch hazel-glycerin pad Commonly known as: TUCKS Apply 1 Application topically See admin instructions. Uses 1 pad TID - QID as needed for bowel movement        Discharge Instructions: Please refer to Patient Instructions section of EMR for full details.  Patient was counseled important signs and symptoms that should prompt return to medical care, changes in medications, dietary instructions, activity restrictions, and follow up appointments.   Follow-Up Appointments:  Follow-up Information     Elder Negus, MD. Go on 12/07/2021.   Specialties: Cardiology, Radiology Why: Please arrive for your appointment at 8:30am. Contact information: [address] [place] [address] [phone]         Victorino Sparrow, MD. Go on 12/15/2021.   Specialty: Vascular Surgery Why: Please arrive at 8:45 am for your appointment at 9:00 am. Contact information: [address] [place] [address] [phone]                 Darral Dash, DO 12/06/2021, 9:49 AM PGY-1, [facility] Family [facility]

## 2021-12-04 NOTE — ED Notes (Signed)
Patient resting in bed, transport cardiac monitor in place, NAD noted.

## 2021-12-04 NOTE — Assessment & Plan Note (Signed)
Cocaine and alcohol use several days ago.  UDS positive for THC and cocaine.  No history of alcohol withdrawal. -CIWA's every 4 hours, notify FM team if greater than 4

## 2021-12-04 NOTE — Hospital Course (Addendum)
Angel Golden is a 61 y.o.male with a history of hypertension who was admitted to the family medicine teaching Service at [facility] for abdominal pain and vomiting. His problem based hospital course is detailed below:   Infrarenal abdominal aortic aneurysm without rupture Newly identified 6 cm abdominal aortic aneurysm found on CT abdomen pelvis.  Vascular surgery was consulted and recommended outpatient elective surgery, and cardiology clearance for surgery.  Hypertension Blood pressure elevated to high of 208/122 in the emergency room.  Improved to 131/92 with pain medication.  LDL elevated to 158, A1c 5.7.  Patient discharged on home blood pressure regimen amlodipine 5 mg daily, and atorvastatin 40 mg.  Polysubstance dependence UDS was positive for cocaine in emergency department.  Transitions of care was consulted.  Patient was counseled on cocaine cessation and important with stress in the setting of new abdominal aortic aneurysm.  Other chronic conditions were medically managed with home medications and formulary alternatives as necessary   PCP Follow-up Recommendations: Substance abuse: Patient cocaine positive on admission with severe hypertension. Given large AAA, needs immediate cocaine cessation and tight blood pressure control.  AAA 6.0 cm: Cardiology and vascular surgery appointments scheduled. Hypertension: Discharged on amlodipine 5 mg daily Cholesterol: LDL 158, 10-year ASCVD risk 24%. Started high intensity statin with atorvastatin 40 mg. PCP to recheck and adjust as indicated.   Admitted for obs, stomach pain, h/o HTN, cannabis, alcohol. Stomach pain x 1 wk. LLQ w/ n/v since yesterday. Labs wnl. Cocaine and cannabis on UDS. Dilaudid improved pain and BP. Vascular (Dr. Karin Lieu) concerned of size in relation to pain and recommended observation. Vascular will assess in the AM. Leave NPO until vascular sees. If pain comes back, call vascular.

## 2021-12-04 NOTE — Consult Note (Addendum)
Hospital Consult    Reason for Consult: AAA Requesting Physician: ED MRN #:  [id number]  History of Present Illness: This is a 61 y.o. male who presented to the hospital with history of acute abdominal pain.  History includes marijuana and cocaine abuse, tox positive on presentation.  Abdominal CT demonstrated 6 cm AAA.  Post-scan, Angel Golden's abdominal pain resolved.  Vascular surgery was called for further recommendations.  On exam, Angel Golden was doing well.  Native [place], he graduated from [employer].  He denied chest, back, abdominal pain.  He has had 2 episodes of this abdominal pain over the last month.  Both with acute onset, but resolved quickly.  He was unaware of his AAA.  No family history.  Past Medical History:  Diagnosis Date   Bone spur of other site    rt foot   Cirrhosis (HCC)    Hepatitis C    Hypertension    PTSD (post-traumatic stress disorder)     Past Surgical History:  Procedure Laterality Date   HAND SURGERY Right    HAND SURGERY Left     No Known Allergies  Prior to Admission medications   Medication Sig Start Date End Date Taking? Authorizing Provider  amLODipine (NORVASC) 5 MG tablet Take 1 tablet (5 mg total) by mouth daily. 10/25/21  Yes Lamptey, Britta Mccreedy, MD  Cyanocobalamin (B-12 PO) Take 1 tablet by mouth daily.   Yes [provider]  hydrocortisone (ANUSOL-HC) 2.5 % rectal cream Place 1 application  rectally 2 (two) times daily. Patient would like his medicines mailed to him Patient taking differently: Place 1 application  rectally 2 (two) times daily as needed for hemorrhoids. Patient would like his medicines mailed to him 10/25/21  Yes Lamptey, Britta Mccreedy, MD  hydrocortisone (ANUSOL-HC) 25 MG suppository Place 1 suppository (25 mg total) rectally 2 (two) times daily. Patient taking differently: Place 25 mg rectally 2 (two) times daily as needed for hemorrhoids. 10/25/21  Yes Lamptey, Britta Mccreedy, MD  ibuprofen (ADVIL) 200 MG tablet Take 200  mg by mouth daily as needed for headache or mild pain.   Yes [provider]  traZODone (DESYREL) 100 MG tablet Take 1 tablet (100 mg total) by mouth at bedtime as needed for sleep. 05/10/20  Yes Money, Gerlene Burdock, FNP  witch hazel-glycerin (TUCKS) pad Apply 1 Application topically See admin instructions. Uses 1 pad TID - QID as needed for bowel movement   Yes [provider]    Social History   Socioeconomic History   Marital status: Married    Spouse name: Not on file   Number of children: Not on file   Years of education: Not on file   Highest education level: Not on file  Occupational History   Not on file  Tobacco Use   Smoking status: Some Days    Packs/day: 0.50    Types: Cigarettes   Smokeless tobacco: Never  Vaping Use   Vaping Use: Never used  Substance and Sexual Activity   Alcohol use: Yes    Alcohol/week: 12.0 standard drinks of alcohol    Types: 12 Cans of beer per week   Drug use: Not Currently    Types: Cocaine, Marijuana   Sexual activity: Not on file  Other Topics Concern   Not on file  Social History Narrative   Not on file   Social Determinants of Health   Financial Resource Strain: Not on file  Food Insecurity: Not on file  Transportation Needs: Not  on file  Physical Activity: Not on file  Stress: Not on file  Social Connections: Not on file  Intimate Partner Violence: Not on file   History reviewed. No pertinent family history.  ROS: Otherwise negative unless mentioned in HPI  Physical Examination  Vitals:   12/04/21 0721 12/04/21 0816  BP: 125/86   Pulse: 68   Resp: 14   Temp:  98 F (36.7 C)  SpO2: 100%    Body mass index is 24.41 kg/m.  General:  WDWN in NAD Gait: Not observed HENT: WNL, normocephalic Pulmonary: normal  Cardiac: regular Abdomen: soft, NT/ND, no masses Skin: without rashes Vascular Exam/Pulses: 2+ DP bilaterally Extremities: without ischemic changes, without Gangrene , without cellulitis;  without open wounds;  Musculoskeletal: no muscle wasting or atrophy  Neurologic: A&O X 3;  No focal weakness or paresthesias are detected; speech is fluent/normal Psychiatric:  The pt has Normal affect. Lymph:  Unremarkable  CBC    Component Value Date/Time   WBC 6.5 12/03/2021 1522   RBC 4.90 12/03/2021 1522   HGB 14.7 12/03/2021 1522   HCT 42.8 12/03/2021 1522   PLT 222 12/03/2021 1522   MCV 87.3 12/03/2021 1522   MCH 30.0 12/03/2021 1522   MCHC 34.3 12/03/2021 1522   RDW 13.3 12/03/2021 1522   LYMPHSABS 1.2 12/03/2021 1522   MONOABS 0.4 12/03/2021 1522   EOSABS 0.0 12/03/2021 1522   BASOSABS 0.0 12/03/2021 1522    BMET    Component Value Date/Time   NA 137 12/04/2021 0445   K 3.6 12/04/2021 0445   CL 103 12/04/2021 0445   CO2 26 12/04/2021 0445   GLUCOSE 111 (H) 12/04/2021 0445   BUN 6 12/04/2021 0445   CREATININE 0.84 12/04/2021 0445   CALCIUM 9.1 12/04/2021 0445   GFRNONAA >60 12/04/2021 0445   GFRAA >60 06/02/2019 1204    COAGS: Lab Results  Component Value Date   INR 1.1 12/03/2021     Non-Invasive Vascular Imaging:   6cm AAA   ASSESSMENT/PLAN: This is a 61 y.o. male who presented with acute onset abdominal pain with imaging demonstrating infrarenal abdominal aortic aneurysm.  Abdominal pain had resolved once the scan was completed.  On exam, the abdomen was soft.  [name] was asymptomatic.  I had a long discussion with Angel Golden regarding the above.  At 6 cm, Angel Golden meets criteria for elective repair of his abdominal aneurysm.  The abdominal pain appreciated earlier, was not from his aneurysm, as pain with acute aortic syndrome does not wax and wane, or resolve.  Most likely, the pain was from cocaine induced vasospasm of his mesentery.  I discussed both open and endovascular repair with Angel Golden.  He would be best served with preoperative risk assessment by cardiology prior to elective repair. He is aware that in the interim there is a risk of rupture.  I quoted  10 to 15% per year.  I plan to see him in short order follow-up with repair in the coming weeks.  My office will coordinate his cardiology visit prior to operative discussion.  He was asked to call 911 immediately should new onset chest back or abdominal pain occur.  Fara Olden MD [facility] [phone] 12/04/2021  9:38 AM ___________________________________  Patient seen and examined again this afternoon.  Remains asymptomatic.  Had a long discussion with Gurney outlining what we had discussed earlier in the morning.  His sister had called and was concerned about his discharge.  Per Angel Golden, they  have had bad experiences at [facility] regarding the health of their parents and today is the anniversary of their mother's death. I called Angel Golden and discussed that a 6 cm asymptomatic AAA can be repaired in the outpatient setting.  She was not happy with this and wanted it fixed while he was in the hospital.  Meanwhile, Kemet stated he would not stay another night.  I offered Ashaz elective repair on Thursday if he were to stay as he needs cardiac work-up.  He declined.  Both he and his sister are aware there is a 10 to 15% yearly risk of rupture. Both are aware he should call 9 1 immediately should new onset chest back or abdominal pain occur.  Remains okay for discharge from vascular surgery perspective Outpatient appointments have already been set  Victorino Sparrow

## 2021-12-04 NOTE — Assessment & Plan Note (Addendum)
Elevated up to high of 208/122 but improved to 131/92.  Likely related to pain.  Home medications include amlodipine 5 mg daily.  Blood pressure resolved with pain reduction.  LDL 158.  A1c 5.7 - As needed labetalol for pressure control -Resume home amlodipine -Monitor blood pressure -Start atorvastatin 40 mg daily

## 2021-12-04 NOTE — Discharge Instructions (Addendum)
Thank you for letting us care for you during your stay.  You were admitted to the [facility] Medicine Teaching Service.   You were admitted for stomach pain, nausea and vomiting. Your stomach pain is likely due to the effect of cocaine on your stomach blood vessels.  We found an abdominal aortic aneurysm measuring 6x6cm.   We recommend following up specifically with cardiology and vascular surgery for the abdominal aortic aneurysm.      Follow-up Information     Elder Negus, MD. Go on 12/07/2021.   Specialties: Cardiology, Radiology Why: Please arrive for your appointment at 8:30am. Contact information: [address] [place] [address] [phone]         Victorino Sparrow, MD. Go on 12/15/2021.   Specialty: Vascular Surgery Why: Please arrive at 8:45 am for your appointment at 9:00 am. Contact information: [address] [place] [address] [phone]                   Please follow up with your primary care physician in 1 week.   If your symptoms worsen or return, please return to the hospital.  Please let us know if you have questions about your stay at [facility].

## 2021-12-04 NOTE — Assessment & Plan Note (Addendum)
Newly identified 6.0 cm x 6.1 cm.  Vascular surgeon consulted and and recommended outpatient follow-up for surgery.  Pain is likely cocaine induced vasospasm.  Comfortable, stable currently with good response to pain medication in ED. -Vascular surgery consulted, appreciate recommendations -Continuous cardiac monitoring x24 hours -Vital signs per unit with pulse ox check -Heart healthy diet

## 2021-12-04 NOTE — ED Notes (Signed)
Spoke with Family medicine who will change NPO status and approved oral meds

## 2021-12-04 NOTE — H&P (Signed)
Hospital Admission History and Physical Service Pager: [phone]  Patient name: Angel Golden Medical record number: [id number] Date of Birth: 03-13-1961 Age: 61 y.o. Gender: male  Primary Care Provider: [facility] Consultants: Vascular Surgery Code Status: Full Preferred Emergency Contact:  Contact Information     Name Relation Home Work [place] Sister   [phone]      Chief Complaint: Abdominal Pain  Assessment and Plan: Angel Golden is a 61 y.o. male presenting with severe abdominal pain with nausea and vomiting, found to have a AAA. Differential for this patient's presentation of this included UTI, nephrolithiasis, pyelonephritis, diverticulitis, gastroenteritis, pancreatitis, MI, AAA.  Blood work thus far remarkably stable, UDS positive for THC and cocaine and given timeline cocaine may have led to symptomatology regarding AAA without rupture.   * Infrarenal abdominal aortic aneurysm (AAA) without rupture (HCC) Newly identified 6.0 cm x 6.1 cm.  Vascular surgeon consulted and will see in the morning for evaluation of surgery versus outpatient management.  Comfortable, stable currently with good response to pain medication in ED. -Admit to FMTS, attending Dr. Pollie Meyer, med telemetry -Vascular surgery consulted, appreciate recommendations -Continuous cardiac monitoring x24 hours -Vital signs per unit with pulse ox check -Diet NPO in anticipation of potential surgery -A.m. BMP, A1c, lipid panel  Hypertension Elevated up to high of 208/122 but improved to 131/92.  Likely related to pain.  Home medications include amlodipine 5 mg daily. -Hold medications given n.p.o. status -Monitor blood pressure  Polysubstance dependence (HCC) Cocaine and alcohol use several days ago.  UDS positive for THC and cocaine.  No history of alcohol withdrawal. -CIWA's every 4 hours, notify FM team if greater than 4  Chronic, stable medical conditions: PTSD,  anxiety: Takes unidentified as needed anxiolytic.  Confirm medications in the morning if staying for longer than observation. Cirrhosis: Not appreciated on CT  FEN/GI: NPO VTE Prophylaxis: SCDs (possible surgery)  Disposition: med tele  History of Present Illness:  Angel Golden is a 61 y.o. male presenting with extreme abdominal pain with nausea and vomiting.  Patient reports abdominal pain starting on the 4th of July, he states that he came to the ER that morning but went away so he left. The pain came again this morning and it feels like a constant pressure every few breaths. The pain is all across his abdomen. He was unable to get control of the pain at home. He does note that the pain medication in the ER helped his pain (he threw up the oral dose).  Not currently having pain and nausea has improved.  Notes that he had a couple specks of blood in his emesis.  Patient reports 3 months ago he had a large flare of hemorrhoids, the hemorrhoids are still present.  Hx of anxiety, PTSD, HTN. Home medications are amlodipine 5mg  daily (took yesterday), unsure of the anxiety medication (take as needed, took day before yesterday), Trazodone (reports >100mg ).  Also endorses taking a heart pill from time to time that he could not identify the name.   His sister will be by in the morning and they are going to get the list of his medications.    In the ED, patient was given Zofran, Percocet 5-325.  He vomited the Percocet.  Later received Compazine and Dilaudid for nausea and pain control, respectively. A CT scan showed AAA of 6cm in size. Vascular surgery was called and recommended observation with plans for morning evaluation for possible  surgery.   Review Of Systems: Per HPI with the following additions:  Denies: urinary or bowel changes, HA, vision changes, chest pain, URI symptoms Reports: some spots of blood in prior vomitus, shortness of breath related to abdominal pain  Pertinent Past Medical  History: Polysubstance use, MDD, HTN, Cirrhosis, Hep C, PTSD Remainder reviewed in history tab.   Pertinent Past Surgical History: Hand surgery  Remainder reviewed in history tab. Marland Kitchen  Pertinent Social History: Tobacco use: Yes (1 pack every 3 days, x45y) Alcohol use: 4 25oz beers per day (Friday night). No hx of withdrawal  Other Substance use: marijuana, cocaine (last used Thursday) Lives alone  Pertinent Family History: Mother and father with HTN and T2DM Mother with congenital heart problems Remainder reviewed in history tab.   Important Outpatient Medications: Amlodipine 5mg  daily (last dose 7/23) Trazodone unclear dose (last dose 7/23) *Reports unknown heart medication used PRN, and an anxiety medication used PRN Remainder reviewed in medication history.   Objective: BP (!) 131/92 (BP Location: Left Arm)   Pulse 74   Temp 98.1 F (36.7 C) (Oral)   Resp 16   Ht 5\' 11"  (1.803 m)   Wt 79.4 kg   SpO2 100%   BMI 24.41 kg/m  Exam: General: Awake, alert and appropriately responsive in NAD Neck: Normal ROM Chest: CTAB, normal WOB. Good air movement bilaterally.   Heart: RRR, no murmur appreciated Abdomen: Soft, minimal LLQ tenderness, non-distended. Normoactive bowel sounds. No HSM appreciated.  Extremities: Moves all extremities equally.  No pitting edema of BLEs MSK: Normal bulk and tone Neuro: Appropriately responsive to stimuli. No gross deficits appreciated.  Skin: No rashes or lesions appreciated.    Labs:  CBC BMET  Recent Labs  Lab 12/03/21 1522  WBC 6.5  HGB 14.7  HCT 42.8  PLT 222   Recent Labs  Lab 12/03/21 1522  NA 138  K 3.7  CL 104  CO2 25  BUN 6  CREATININE 0.93  GLUCOSE 105*  CALCIUM 9.8     Drugs of Abuse     Component Value Date/Time   LABOPIA NONE DETECTED 12/03/2021 2001   COCAINSCRNUR POSITIVE (A) 12/03/2021 2001   LABBENZ NONE DETECTED 12/03/2021 2001   AMPHETMU NONE DETECTED 12/03/2021 2001   THCU POSITIVE (A) 12/03/2021  2001   LABBARB NONE DETECTED 12/03/2021 2001   Trops: 7>6>5 PT 13.8 INR 1.1 BNP nl Lipase nl  EKG: NSR w/ appreciable signs of LVH in V4-V6  Imaging Studies Performed: CXR: no active cardiopulmonary disease CT ab/pel: 6.0 cm x 6.1 cm infrarenal AAA  12/05/2021, DO 12/04/2021, 3:42 AM PGY-2,  Family Medicine  FPTS Intern pager: [phone], text pages welcome Secure chat group [facility] Teaching Service

## 2021-12-04 NOTE — ED Notes (Signed)
DC instructions reviewed with pt. PT verbalized understanding. PT DC °

## 2021-12-04 NOTE — Telephone Encounter (Signed)
Spoke to pt to let him know of his appts with cardiology and with Dr. Karin Lieu

## 2021-12-07 ENCOUNTER — Other Ambulatory Visit: Payer: Self-pay

## 2021-12-07 ENCOUNTER — Ambulatory Visit: Payer: 59 | Admitting: Cardiology

## 2021-12-07 ENCOUNTER — Encounter: Payer: Self-pay | Admitting: Cardiology

## 2021-12-07 VITALS — BP 147/101 | HR 82 | Temp 98.0°F | Resp 16 | Ht 70.0 in | Wt 156.0 lb

## 2021-12-07 DIAGNOSIS — I10 Essential (primary) hypertension: Secondary | ICD-10-CM

## 2021-12-07 DIAGNOSIS — Z01810 Encounter for preprocedural cardiovascular examination: Secondary | ICD-10-CM | POA: Insufficient documentation

## 2021-12-07 DIAGNOSIS — E782 Mixed hyperlipidemia: Secondary | ICD-10-CM | POA: Diagnosis not present

## 2021-12-07 DIAGNOSIS — I7143 Infrarenal abdominal aortic aneurysm, without rupture: Secondary | ICD-10-CM

## 2021-12-07 MED ORDER — ASPIRIN 81 MG PO TBEC: 81.0000 mg | DELAYED_RELEASE_TABLET | Freq: Every day | ORAL | 3 refills | Status: DC

## 2021-12-07 MED ORDER — LABETALOL HCL 100 MG PO TABS: 100.0000 mg | ORAL_TABLET | Freq: Two times a day (BID) | ORAL | 3 refills | Status: DC

## 2021-12-07 NOTE — Addendum Note (Signed)
Addended by: Elder Negus on: 12/07/2021 01:19 PM   Modules accepted: Orders

## 2021-12-07 NOTE — Progress Notes (Signed)
Patient referred by [facility], [facility] for pre-op evaluation  Subjective:   Angel Golden, male    DOB: 1960/12/31, 61 y.o.   MRN: [id number]   Chief Complaint  Patient presents with   Hypertension   Medical Clearance     HPI  61 y/o [place] American male with hypertension, hyperlipidemia. 6.1 cm infrarenal AAA, h/o cocaine abuse, family history of congestive heart failure, referred for pre-op evaluation  Patient is here with his sister today.  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  As part of follow-up, he used to walk a lot and had any complaints of chest pain or shortness of breath.  Patient had abdominal pain, and was seen in emergency room.  This is when he was incidentally found to have 6.1 cm abdominal aorta aneurysm.  He was evaluated by vascular surgeon Dr. Unk Lightning.  He is likely to undergo either EVAR or open repair within the next week or so by Dr. Unk Lightning, pending evaluation by me.  Patient smokes couple cigarettes a day, and also been drinking couple cans every other day until his recent ER visit with abdominal pain.  To me, he denied cocaine use.  However, this has been documented on his ER visit.   Past Medical History:  Diagnosis Date   Bone spur of other site    rt foot   Cirrhosis ([place])    Hepatitis C    Hypertension    PTSD (post-traumatic stress disorder)      Past Surgical History:  Procedure Laterality Date   HAND SURGERY Right    HAND SURGERY Left      Social History   Tobacco Use  Smoking Status Some Days   Packs/day: 0.50   Types: Cigarettes  Smokeless Tobacco Never    Social History   Substance and Sexual Activity  Alcohol Use Yes   Alcohol/week: 12.0 standard drinks of alcohol   Types: 12 Cans of beer per week     Family History  Problem Relation Age of Onset   Heart failure Mother    Stroke Father 49       first one   Heart failure Sister    Cancer - Lung  Sister       Current Outpatient Medications:    amLODipine (NORVASC) 5 MG tablet, Take 1 tablet (5 mg total) by mouth daily., Disp: 30 tablet, Rfl: 1   atorvastatin (LIPITOR) 40 MG tablet, Take 1 tablet (40 mg total) by mouth daily., Disp: 30 tablet, Rfl: 1   Cyanocobalamin (B-12 PO), Take 1 tablet by mouth daily., Disp: , Rfl:    hydrocortisone (ANUSOL-HC) 2.5 % rectal cream, Place 1 application  rectally 2 (two) times daily. Patient would like his medicines mailed to him (Patient taking differently: Place 1 application  rectally 2 (two) times daily as needed for hemorrhoids. Patient would like his medicines mailed to him), Disp: 30 g, Rfl: 0   hydrocortisone (ANUSOL-HC) 25 MG suppository, Place 1 suppository (25 mg total) rectally 2 (two) times daily. (Patient taking differently: Place 25 mg rectally 2 (two) times daily as needed for hemorrhoids.), Disp: 12 suppository, Rfl: 0   traZODone (DESYREL) 100 MG tablet, Take 1 tablet (100 mg total) by mouth at bedtime as needed for sleep., Disp: 30 tablet, Rfl: 0   witch hazel-glycerin (TUCKS) pad, Apply 1 Application topically See admin instructions. Uses 1 pad TID - QID as needed for  bowel movement, Disp: , Rfl:    Cardiovascular and other pertinent studies:  Reviewed external labs and tests, independently interpreted  EKG 12/07/2021: Sinus rhythm 82 bpm Possible old anteroseptal infarct Normal EKG  CT abdomen w/contrast 12/04/2021: 1. 6.0 cm x 6.1 cm infrarenal abdominal aortic aneurysm. Recommend referral to a vascular specialist. Reference: J Am Coll Radiol 3300;76:226-333.   Aortic Atherosclerosis (ICD10-I70.0).   Recent labs: 12/04/2021: Glucose 111, BUN/Cr 6/0.84. EGFR >60. Na/K 137/3.6. Rest of the CMP normal H/H 14/42. MCV 87. Platelets 222 HbA1C 5.7% Chol 335, TG 67, HDL 54, LDL 158    Review of Systems  Cardiovascular:  Negative for chest pain, dyspnea on exertion, leg swelling, palpitations and syncope.          Vitals:   12/07/21 0829 12/07/21 0836  BP: (!) 146/101 (!) 147/101  Pulse: 80 82  Resp: 16   Temp: 98 F (36.7 C)   SpO2: 98% 98%     Body mass index is 22.38 kg/m. Filed Weights   12/07/21 0829  Weight: 156 lb (70.8 kg)     Objective:   Physical Exam Vitals and nursing note reviewed.  Constitutional:      General: He is not in acute distress. Neck:     Vascular: No JVD.  Cardiovascular:     Rate and Rhythm: Normal rate and regular rhythm.     Heart sounds: Normal heart sounds. No murmur heard.    Comments: Palpable AAA in lower abdomen Pulmonary:     Effort: Pulmonary effort is normal.     Breath sounds: Normal breath sounds. No wheezing or rales.  Musculoskeletal:     Right lower leg: No edema.     Left lower leg: No edema.         Visit diagnoses:   ICD-10-CM   1. Preop cardiovascular exam  Z01.810 EKG 12-Lead    PCV ECHOCARDIOGRAM COMPLETE    2. Infrarenal abdominal aortic aneurysm (AAA) without rupture (HCC)  I71.43     3. Primary hypertension  I10 PCV ECHOCARDIOGRAM COMPLETE    4. Mixed hyperlipidemia  E78.2        Orders Placed This Encounter  Procedures   EKG 12-Lead   PCV ECHOCARDIOGRAM COMPLETE     Meds ordered this encounter  Medications   aspirin EC 81 MG tablet    Sig: Take 1 tablet (81 mg total) by mouth daily. Swallow whole.    Dispense:  90 tablet    Refill:  3   labetalol (NORMODYNE) 100 MG tablet    Sig: Take 1 tablet (100 mg total) by mouth 2 (two) times daily.    Dispense:  60 tablet    Refill:  3     Assessment & Recommendations:   61 y/o [place] American male with hypertension, hyperlipidemia. 6.1 cm infrarenal AAA, h/o cocaine abuse, family history of congestive heart failure, referred for pre-op evaluation  AAA: This is patient's primary problem requiring repair, either endovascular or open surgical, in the near future. His risk factors for CAD, with hypertension, hyperlipidemia, tobacco and cocaine abuse, but  no symptoms suggestive of angina or heart failure.  His baseline functional capacity is good.  Routine stress testing would not reduce his perioperative cardiac risk for AAA repair, which is necessary.  I will obtain echocardiogram given his hypertension and family history of congestive heart failure.  Continue Lipitor 40 mg, added aspirin 81 mg daily at least for perioperative.  I added labetalol 100 mg twice daily, for  better blood pressure and heart rate control in view of his AAA.  I cautioned him against tobacco, alcohol, and cocaine use in view of upcoming AAA repair.  Provided echocardiogram does not show any severe abnormalities, AAA repair-whether endovascular or operative, may proceed with acceptable cardiac risk.   [facility] will [facility], for monitoring hypertension and hyperlipidemia.  I will see him on as-needed basis.  Thank you for referring the patient to [place]. Please feel free to contact with any questions.   Nigel Mormon, MD Pager: [phone] Office: [phone]

## 2021-12-08 ENCOUNTER — Other Ambulatory Visit: Payer: 59

## 2021-12-08 ENCOUNTER — Ambulatory Visit: Payer: 59

## 2021-12-08 DIAGNOSIS — I10 Essential (primary) hypertension: Secondary | ICD-10-CM

## 2021-12-08 DIAGNOSIS — Z01810 Encounter for preprocedural cardiovascular examination: Secondary | ICD-10-CM | POA: Diagnosis not present

## 2021-12-11 NOTE — Progress Notes (Signed)
Normal pumping function of the heart. No severe heart valve abnormalities noted.   Okay to proceed with AAA repair.  Thanks MJP

## 2021-12-12 NOTE — Progress Notes (Signed)
Called and spoke with patient regarding his echocardiogram results.  ?

## 2021-12-14 NOTE — Progress Notes (Signed)
Office Note     CC:  AAA 6.2cm Requesting Provider:  Clinic, [facility]  HPI: Angel Golden is a 61 y.o. (04/30/1961) male presenting in follow up s/p recent hospital consultation for asymptomatic AAA.  Exam today, Angel Golden was doing well.  He had no complaints.  He denied back pain, abdominal pain, chest pain. He denies symptoms of claudication, ischemic rest pain, tissue loss.  No previous abdominal surgery  Smoker, daily aspirin, has not used cocaine since his hospitalization.   Past Medical History:  Diagnosis Date   Bone spur of other site    rt foot   Cirrhosis (HCC)    Hepatitis C    Hypertension    PTSD (post-traumatic stress disorder)     Past Surgical History:  Procedure Laterality Date   HAND SURGERY Right    HAND SURGERY Left     Social History   Socioeconomic History   Marital status: Single    Spouse name: Not on file   Number of children: 0   Years of education: Not on file   Highest education level: Not on file  Occupational History   Not on file  Tobacco Use   Smoking status: Some Days    Packs/day: 0.50    Years: 45.00    Total pack years: 22.50    Types: Cigarettes   Smokeless tobacco: Never  Vaping Use   Vaping Use: Never used  Substance and Sexual Activity   Alcohol use: Yes    Alcohol/week: 12.0 standard drinks of alcohol    Types: 12 Cans of beer per week    Comment: occ   Drug use: Yes    Types: Cocaine, Marijuana   Sexual activity: Not on file  Other Topics Concern   Not on file  Social History Narrative   Not on file   Social Determinants of Health   Financial Resource Strain: Not on file  Food Insecurity: Not on file  Transportation Needs: Not on file  Physical Activity: Not on file  Stress: Not on file  Social Connections: Not on file  Intimate Partner Violence: Not on file   Family History  Problem Relation Age of Onset   Heart failure Mother    Stroke Father 78       first one   Heart failure Sister     Cancer - Lung Sister     Current Outpatient Medications  Medication Sig Dispense Refill   amLODipine (NORVASC) 5 MG tablet Take 1 tablet (5 mg total) by mouth daily. 30 tablet 1   aspirin EC 81 MG tablet Take 1 tablet (81 mg total) by mouth daily. Swallow whole. 90 tablet 3   atorvastatin (LIPITOR) 40 MG tablet Take 1 tablet (40 mg total) by mouth daily. 30 tablet 1   Cyanocobalamin (B-12 PO) Take 1 tablet by mouth daily.     hydrocortisone (ANUSOL-HC) 2.5 % rectal cream Place 1 application  rectally 2 (two) times daily. Patient would like his medicines mailed to him (Patient taking differently: Place 1 application  rectally 2 (two) times daily as needed for hemorrhoids. Patient would like his medicines mailed to him) 30 g 0   labetalol (NORMODYNE) 100 MG tablet Take 1 tablet (100 mg total) by mouth 2 (two) times daily. 60 tablet 3   traZODone (DESYREL) 100 MG tablet Take 1 tablet (100 mg total) by mouth at bedtime as needed for sleep. 30 tablet 0   witch hazel-glycerin (TUCKS) pad Apply 1 Application topically See  admin instructions. Uses 1 pad TID - QID as needed for bowel movement     No current facility-administered medications for this visit.    No Known Allergies   REVIEW OF SYSTEMS:  [X]  denotes positive finding, [ ]  denotes negative finding Cardiac  Comments:  Chest pain or chest pressure:    Shortness of breath upon exertion:    Short of breath when lying flat:    Irregular heart rhythm:        Vascular    Pain in calf, thigh, or hip brought on by ambulation:    Pain in feet at night that wakes you up from your sleep:     Blood clot in your veins:    Leg swelling:         Pulmonary    Oxygen at home:    Productive cough:     Wheezing:         Neurologic    Sudden weakness in arms or legs:     Sudden numbness in arms or legs:     Sudden onset of difficulty speaking or slurred speech:    Temporary loss of vision in one eye:     Problems with dizziness:          Gastrointestinal    Blood in stool:     Vomited blood:         Genitourinary    Burning when urinating:     Blood in urine:        Psychiatric    Major depression:         Hematologic    Bleeding problems:    Problems with blood clotting too easily:        Skin    Rashes or ulcers:        Constitutional    Fever or chills:      PHYSICAL EXAMINATION:  There were no vitals filed for this visit.  General:  WDWN in NAD; vital signs documented above Gait: Not observed HENT: WNL, normocephalic Pulmonary: normal non-labored breathing , without wheezing Cardiac: regular HR,  Abdomen: soft, NT, no masses Skin: without rashes Vascular Exam/Pulses:  Right Left  Radial 2+ (normal) 2+ (normal)  Ulnar 2+ (normal) 2+ (normal)  Femoral 2+ (normal) 2+ (normal)  Popliteal    DP 2+ (normal) 2+ (normal)  PT 2+ (normal) 2+ (normal)   Extremities: without ischemic changes, without Gangrene , without cellulitis; without open wounds;  Musculoskeletal: no muscle wasting or atrophy  Neurologic: A&O X 3;  No focal weakness or paresthesias are detected Psychiatric:  The pt has Normal affect.   Non-Invasive Vascular Imaging:   0.3 cm infrarenal abdominal aneurysm    ASSESSMENT/PLAN: Angel Golden is a 61 y.o. male presenting with asymptomatic 6.2 cm infrarenal abdominal aneurysm.  In the interim, Angel Golden has seen cardiology, and has been cleared for open and endovascular surgery.  He has not used any cocaine since his last hospitalization.  I had a long conversation with Angel Golden regarding open abdominal aortic aneurysm repair as well as endovascular aortic repair.  We discussed the short-term and long-term morbidity mortality associated with both as well as the need for further intervention sometimes associated with endovascular aortic repair.  After discussing the risk and benefits of each, Angel Golden elected to pursue endovascular aortic repair.  His surgery is scheduled on 12/28/2021.  I  asked that he immediately call 911 should he have any symptoms in the interim.   Dorene Sorrow, MD  [facility] [phone]

## 2021-12-15 ENCOUNTER — Encounter: Payer: Self-pay | Admitting: Vascular Surgery

## 2021-12-15 ENCOUNTER — Ambulatory Visit (INDEPENDENT_AMBULATORY_CARE_PROVIDER_SITE_OTHER): Payer: 59 | Admitting: Vascular Surgery

## 2021-12-15 VITALS — BP 122/86 | HR 88 | Temp 99.0°F | Resp 20 | Ht 70.0 in | Wt 156.0 lb

## 2021-12-15 DIAGNOSIS — I7143 Infrarenal abdominal aortic aneurysm, without rupture: Secondary | ICD-10-CM | POA: Diagnosis not present

## 2021-12-18 ENCOUNTER — Other Ambulatory Visit: Payer: Self-pay

## 2021-12-18 DIAGNOSIS — I7143 Infrarenal abdominal aortic aneurysm, without rupture: Secondary | ICD-10-CM

## 2021-12-21 ENCOUNTER — Other Ambulatory Visit: Payer: 59

## 2021-12-22 ENCOUNTER — Ambulatory Visit: Payer: 59 | Admitting: Vascular Surgery

## 2021-12-27 ENCOUNTER — Other Ambulatory Visit: Payer: Self-pay

## 2021-12-27 ENCOUNTER — Encounter (HOSPITAL_COMMUNITY): Payer: Self-pay | Admitting: Vascular Surgery

## 2021-12-27 NOTE — Progress Notes (Signed)
[facility] - [facility]- Dr. Kirtland Bouchard. Clifton Custard- [place]  Cardiologist - Dr. Judie Petit. [place]  EP- Denies  Endocrine- Denies  Pulm- Denies  Chest x-ray - 12/03/21 (E)  EKG - 12/07/21 (E)  Stress Test - Denies  ECHO - 12/08/21 (E)  Cardiac Cath - Denies  AICD-na PM-na LOOP-na  Nerve Stimulator- Denies  Dialysis- Denies  Sleep Study - Denies CPAP - Denies  LABS- 12/28/21: T/S, PT, PTT, UA, PCR 12/03/21(E): CBC w/D, CMP, UDS  ASA- Cont.  ERAS- No  HA1C- Denies  Anesthesia- No  Pt denies having chest pain, sob, or fever during the pre-op phone call. All instructions explained to the pt, with a verbal understanding of the material including: as of today, stop taking all Aspirin (unless instructed by your doctor) and Other Aspirin containing products, Vitamins, Fish oils, and Herbal medications. Also stop all NSAIDS i.e. Advil, Ibuprofen, Motrin, Aleve, Anaprox, Naproxen, BC, Goody Powders, and all Supplements.  Pt also instructed to wear a mask and social distance if he goes out. The opportunity to ask questions was provided.

## 2021-12-27 NOTE — Anesthesia Preprocedure Evaluation (Signed)
Anesthesia Evaluation  Patient identified by MRN, date of birth, ID band Patient awake    Reviewed: Allergy & Precautions, NPO status , Patient's Chart, lab work & pertinent test results  Airway Mallampati: II  TM Distance: >3 FB Neck ROM: Full    Dental no notable dental hx.    Pulmonary neg pulmonary ROS, Current Smoker and Patient abstained from smoking.,    Pulmonary exam normal        Cardiovascular hypertension, Pt. on medications + Peripheral Vascular Disease   Rhythm:Regular Rate:Normal     Neuro/Psych Anxiety Depression negative neurological ROS     GI/Hepatic negative GI ROS, (+) Cirrhosis       , Hepatitis -, C  Endo/Other  negative endocrine ROS  Renal/GU negative Renal ROS  negative genitourinary   Musculoskeletal negative musculoskeletal ROS (+)   Abdominal Normal abdominal exam  (+)   Peds  Hematology negative hematology ROS (+)   Anesthesia Other Findings   Reproductive/Obstetrics                            Anesthesia Physical Anesthesia Plan  ASA: 3  Anesthesia Plan: General   Post-op Pain Management:    Induction: Intravenous  PONV Risk Score and Plan: 1 and Ondansetron, Dexamethasone, Midazolam and Treatment may vary due to age or medical condition  Airway Management Planned: Mask and Oral ETT  Additional Equipment: Arterial line  Intra-op Plan:   Post-operative Plan: Extubation in OR  Informed Consent: I have reviewed the patients History and Physical, chart, labs and discussed the procedure including the risks, benefits and alternatives for the proposed anesthesia with the patient or authorized representative who has indicated his/her understanding and acceptance.     Dental advisory given  Plan Discussed with: CRNA  Anesthesia Plan Comments: (Lab Results      Component                Value               Date                      WBC                       6.5                 12/03/2021                HGB                      14.7                12/03/2021                HCT                      42.8                12/03/2021                MCV                      87.3                12/03/2021                PLT  222                 12/03/2021           Lab Results      Component                Value               Date                      NA                       137                 12/04/2021                K                        3.6                 12/04/2021                CO2                      26                  12/04/2021                GLUCOSE                  111 (H)             12/04/2021                BUN                      6                   12/04/2021                CREATININE               0.84                12/04/2021                CALCIUM                  9.1                 12/04/2021                GFRNONAA                 >60                 12/04/2021            ECHO 07/23: Echocardiogram 12/08/2021:  Normal LV systolic function with EF 68%. Mild basal asymmetric hypertrophy  of the left ventricle. Left ventricle cavity is normal in size. Normal  global Younglove motion. Normal diastolic filling pattern. Calculated EF 68%.  Trileaflet aortic valve. Mild (Grade I) aortic regurgitation. )       Anesthesia Quick Evaluation

## 2021-12-28 ENCOUNTER — Inpatient Hospital Stay (HOSPITAL_COMMUNITY): Payer: 59 | Admitting: Certified Registered Nurse Anesthetist

## 2021-12-28 ENCOUNTER — Other Ambulatory Visit: Payer: Self-pay

## 2021-12-28 ENCOUNTER — Encounter (HOSPITAL_COMMUNITY): Payer: Self-pay | Admitting: Vascular Surgery

## 2021-12-28 ENCOUNTER — Encounter (HOSPITAL_COMMUNITY)
Admission: RE | Disposition: A | Discharge status: Home / Self Care | Payer: Self-pay | Source: Home / Self Care | Attending: Vascular Surgery

## 2021-12-28 ENCOUNTER — Telehealth: Payer: Self-pay | Admitting: Vascular Surgery

## 2021-12-28 ENCOUNTER — Inpatient Hospital Stay (HOSPITAL_COMMUNITY): Payer: 59

## 2021-12-28 ENCOUNTER — Inpatient Hospital Stay (HOSPITAL_COMMUNITY)
Admission: RE | Admit: 2021-12-28 | Discharge: 2021-12-29 | DRG: 269 | Disposition: A | Discharge status: Home / Self Care | Payer: 59 | Attending: Vascular Surgery | Admitting: Vascular Surgery

## 2021-12-28 DIAGNOSIS — F4310 Post-traumatic stress disorder, unspecified: Secondary | ICD-10-CM | POA: Diagnosis present

## 2021-12-28 DIAGNOSIS — I10 Essential (primary) hypertension: Secondary | ICD-10-CM

## 2021-12-28 DIAGNOSIS — Z8619 Personal history of other infectious and parasitic diseases: Secondary | ICD-10-CM | POA: Diagnosis not present

## 2021-12-28 DIAGNOSIS — L7632 Postprocedural hematoma of skin and subcutaneous tissue following other procedure: Secondary | ICD-10-CM | POA: Diagnosis not present

## 2021-12-28 DIAGNOSIS — I739 Peripheral vascular disease, unspecified: Secondary | ICD-10-CM | POA: Diagnosis present

## 2021-12-28 DIAGNOSIS — K7460 Unspecified cirrhosis of liver: Secondary | ICD-10-CM | POA: Diagnosis present

## 2021-12-28 DIAGNOSIS — Z8249 Family history of ischemic heart disease and other diseases of the circulatory system: Secondary | ICD-10-CM

## 2021-12-28 DIAGNOSIS — F418 Other specified anxiety disorders: Secondary | ICD-10-CM | POA: Diagnosis not present

## 2021-12-28 DIAGNOSIS — I714 Abdominal aortic aneurysm, without rupture, unspecified: Secondary | ICD-10-CM | POA: Diagnosis not present

## 2021-12-28 DIAGNOSIS — F17210 Nicotine dependence, cigarettes, uncomplicated: Secondary | ICD-10-CM | POA: Diagnosis present

## 2021-12-28 DIAGNOSIS — Y838 Other surgical procedures as the cause of abnormal reaction of the patient, or of later complication, without mention of misadventure at the time of the procedure: Secondary | ICD-10-CM | POA: Diagnosis not present

## 2021-12-28 DIAGNOSIS — I7143 Infrarenal abdominal aortic aneurysm, without rupture: Secondary | ICD-10-CM | POA: Diagnosis not present

## 2021-12-28 DIAGNOSIS — R69 Illness, unspecified: Secondary | ICD-10-CM | POA: Diagnosis not present

## 2021-12-28 HISTORY — PX: ABDOMINAL AORTIC ENDOVASCULAR STENT GRAFT: SHX5707

## 2021-12-28 LAB — CBC
HCT: 40 % (ref 39.0–52.0)
HCT: 38.7 % — ABNORMAL LOW (ref 39.0–52.0)
Hemoglobin: 13.5 g/dL (ref 13.0–17.0)
Hemoglobin: 12.5 g/dL — ABNORMAL LOW (ref 13.0–17.0)
MCH: 30.4 pg (ref 26.0–34.0)
MCH: 30.1 pg (ref 26.0–34.0)
MCHC: 33.8 g/dL (ref 30.0–36.0)
MCHC: 32.3 g/dL (ref 30.0–36.0)
MCV: 90.1 fL (ref 80.0–100.0)
MCV: 93.3 fL (ref 80.0–100.0)
Platelets: 230 10*3/uL (ref 150–400)
Platelets: 199 10*3/uL (ref 150–400)
RBC: 4.44 MIL/uL (ref 4.22–5.81)
RBC: 4.15 MIL/uL — ABNORMAL LOW (ref 4.22–5.81)
RDW: 13.2 % (ref 11.5–15.5)
RDW: 13.2 % (ref 11.5–15.5)
WBC: 5.1 10*3/uL (ref 4.0–10.5)
WBC: 6 10*3/uL (ref 4.0–10.5)
nRBC: 0 % (ref 0.0–0.2)
nRBC: 0 % (ref 0.0–0.2)

## 2021-12-28 LAB — COMPREHENSIVE METABOLIC PANEL
ALT: 16 U/L (ref 0–44)
AST: 23 U/L (ref 15–41)
Albumin: 3.8 g/dL (ref 3.5–5.0)
Alkaline Phosphatase: 78 U/L (ref 38–126)
Anion gap: 8 (ref 5–15)
BUN: 10 mg/dL (ref 6–20)
CO2: 22 mmol/L (ref 22–32)
Calcium: 9.2 mg/dL (ref 8.9–10.3)
Chloride: 109 mmol/L (ref 98–111)
Creatinine, Ser: 0.82 mg/dL (ref 0.61–1.24)
GFR, Estimated: >60 mL/min (ref >60)
Glucose, Bld: 95 mg/dL (ref 70–99)
Potassium: 3.8 mmol/L (ref 3.5–5.1)
Sodium: 139 mmol/L (ref 135–145)
Total Bilirubin: 0.9 mg/dL (ref 0.3–1.2)
Total Protein: 6.9 g/dL (ref 6.5–8.1)

## 2021-12-28 LAB — URINALYSIS, ROUTINE W REFLEX MICROSCOPIC
Bacteria, UA: NONE SEEN
Bilirubin Urine: NEGATIVE
Glucose, UA: NEGATIVE mg/dL
Ketones, ur: NEGATIVE mg/dL
Leukocytes,Ua: NEGATIVE
Nitrite: NEGATIVE
Protein, ur: NEGATIVE mg/dL
Specific Gravity, Urine: 1.02 (ref 1.005–1.030)
pH: 5 (ref 5.0–8.0)

## 2021-12-28 LAB — BASIC METABOLIC PANEL
Anion gap: 6 (ref 5–15)
BUN: 10 mg/dL (ref 6–20)
CO2: 21 mmol/L — ABNORMAL LOW (ref 22–32)
Calcium: 8.7 mg/dL — ABNORMAL LOW (ref 8.9–10.3)
Chloride: 111 mmol/L (ref 98–111)
Creatinine, Ser: 0.83 mg/dL (ref 0.61–1.24)
GFR, Estimated: >60 mL/min (ref >60)
Glucose, Bld: 115 mg/dL — ABNORMAL HIGH (ref 70–99)
Potassium: 3.8 mmol/L (ref 3.5–5.1)
Sodium: 138 mmol/L (ref 135–145)

## 2021-12-28 LAB — TYPE AND SCREEN
ABO/RH(D): A POS
Antibody Screen: NEGATIVE

## 2021-12-28 LAB — PROTIME-INR
INR: 1.1 (ref 0.8–1.2)
INR: 1.2 (ref 0.8–1.2)
Prothrombin Time: 14.3 seconds (ref 11.4–15.2)
Prothrombin Time: 15.4 seconds — ABNORMAL HIGH (ref 11.4–15.2)

## 2021-12-28 LAB — POCT ACTIVATED CLOTTING TIME
Activated Clotting Time: 203 seconds
Activated Clotting Time: 215 seconds

## 2021-12-28 LAB — MAGNESIUM: Magnesium: 1.8 mg/dL (ref 1.7–2.4)

## 2021-12-28 LAB — APTT
aPTT: 30 seconds (ref 24–36)
aPTT: 36 seconds (ref 24–36)

## 2021-12-28 LAB — SURGICAL PCR SCREEN
MRSA, PCR: NEGATIVE
Staphylococcus aureus: NEGATIVE

## 2021-12-28 SURGERY — INSERTION, ENDOVASCULAR STENT GRAFT, AORTA, ABDOMINAL
Anesthesia: General

## 2021-12-28 MED ORDER — MORPHINE SULFATE (PF) 2 MG/ML IV SOLN
2.0000 mg | INTRAVENOUS | Status: DC | PRN
  Administered 2021-12-28: 2 mg via INTRAVENOUS
  Filled 2021-12-28: qty 1

## 2021-12-28 MED ORDER — ASPIRIN 81 MG PO TBEC
81.0000 mg | DELAYED_RELEASE_TABLET | Freq: Every day | ORAL | Status: DC
  Administered 2021-12-28 – 2021-12-29 (×2): 81 mg via ORAL
  Filled 2021-12-28 (×2): qty 1

## 2021-12-28 MED ORDER — METOPROLOL TARTRATE 5 MG/5ML IV SOLN: 2.0000 mg | INTRAVENOUS | Status: DC | PRN

## 2021-12-28 MED ORDER — PROPOFOL 10 MG/ML IV BOLUS
INTRAVENOUS | Status: AC
  Filled 2021-12-28: qty 20

## 2021-12-28 MED ORDER — ORAL CARE MOUTH RINSE: 15.0000 mL | Freq: Once | OROMUCOSAL | Status: AC

## 2021-12-28 MED ORDER — DOCUSATE SODIUM 100 MG PO CAPS
100.0000 mg | ORAL_CAPSULE | Freq: Every day | ORAL | Status: DC
  Administered 2021-12-29: 100 mg via ORAL
  Filled 2021-12-28 (×2): qty 1

## 2021-12-28 MED ORDER — BISACODYL 10 MG RE SUPP: 10.0000 mg | Freq: Every day | RECTAL | Status: DC | PRN

## 2021-12-28 MED ORDER — ACETAMINOPHEN 325 MG PO TABS: 325.0000 mg | ORAL_TABLET | ORAL | Status: DC | PRN

## 2021-12-28 MED ORDER — ACETAMINOPHEN 325 MG RE SUPP: 325.0000 mg | RECTAL | Status: DC | PRN

## 2021-12-28 MED ORDER — CEFAZOLIN SODIUM-DEXTROSE 2-4 GM/100ML-% IV SOLN
2.0000 g | Freq: Three times a day (TID) | INTRAVENOUS | Status: AC
  Administered 2021-12-28 – 2021-12-29 (×2): 2 g via INTRAVENOUS
  Filled 2021-12-28 (×2): qty 100

## 2021-12-28 MED ORDER — POTASSIUM CHLORIDE CRYS ER 20 MEQ PO TBCR: 20.0000 meq | EXTENDED_RELEASE_TABLET | Freq: Every day | ORAL | Status: DC | PRN

## 2021-12-28 MED ORDER — SODIUM CHLORIDE 0.9 % IV SOLN: 500.0000 mL | Freq: Once | INTRAVENOUS | Status: DC | PRN

## 2021-12-28 MED ORDER — PROPOFOL 10 MG/ML IV BOLUS
INTRAVENOUS | Status: DC | PRN
  Administered 2021-12-28: 180 mg via INTRAVENOUS

## 2021-12-28 MED ORDER — GUAIFENESIN-DM 100-10 MG/5ML PO SYRP: 15.0000 mL | ORAL_SOLUTION | ORAL | Status: DC | PRN

## 2021-12-28 MED ORDER — ROCURONIUM BROMIDE 10 MG/ML (PF) SYRINGE
PREFILLED_SYRINGE | INTRAVENOUS | Status: DC | PRN
  Administered 2021-12-28: 20 mg via INTRAVENOUS
  Administered 2021-12-28: 50 mg via INTRAVENOUS

## 2021-12-28 MED ORDER — MIDAZOLAM HCL 2 MG/2ML IJ SOLN
INTRAMUSCULAR | Status: DC | PRN
  Administered 2021-12-28: 2 mg via INTRAVENOUS

## 2021-12-28 MED ORDER — CHLORHEXIDINE GLUCONATE CLOTH 2 % EX PADS
6.0000 | MEDICATED_PAD | Freq: Once | CUTANEOUS | Status: DC
  Administered 2021-12-28: 6 via TOPICAL

## 2021-12-28 MED ORDER — 0.9 % SODIUM CHLORIDE (POUR BTL) OPTIME
TOPICAL | Status: DC | PRN
  Administered 2021-12-28: 1000 mL

## 2021-12-28 MED ORDER — SUGAMMADEX SODIUM 200 MG/2ML IV SOLN
INTRAVENOUS | Status: DC | PRN
  Administered 2021-12-28: 200 mg via INTRAVENOUS

## 2021-12-28 MED ORDER — ONDANSETRON HCL 4 MG/2ML IJ SOLN
INTRAMUSCULAR | Status: DC | PRN
  Administered 2021-12-28: 4 mg via INTRAVENOUS

## 2021-12-28 MED ORDER — PANTOPRAZOLE SODIUM 40 MG PO TBEC
40.0000 mg | DELAYED_RELEASE_TABLET | Freq: Every day | ORAL | Status: DC
  Administered 2021-12-29: 40 mg via ORAL
  Filled 2021-12-28 (×2): qty 1

## 2021-12-28 MED ORDER — CHLORHEXIDINE GLUCONATE 0.12 % MT SOLN
15.0000 mL | Freq: Once | OROMUCOSAL | Status: AC
  Administered 2021-12-28: 15 mL via OROMUCOSAL
  Filled 2021-12-28: qty 15

## 2021-12-28 MED ORDER — HEPARIN 6000 UNIT IRRIGATION SOLUTION
Status: AC
  Filled 2021-12-28: qty 500

## 2021-12-28 MED ORDER — HEPARIN 6000 UNIT IRRIGATION SOLUTION
Status: DC | PRN
  Administered 2021-12-28: 1

## 2021-12-28 MED ORDER — ATORVASTATIN CALCIUM 40 MG PO TABS
40.0000 mg | ORAL_TABLET | Freq: Every day | ORAL | Status: DC
  Administered 2021-12-28 – 2021-12-29 (×2): 40 mg via ORAL
  Filled 2021-12-28 (×2): qty 1

## 2021-12-28 MED ORDER — FENTANYL CITRATE (PF) 250 MCG/5ML IJ SOLN
INTRAMUSCULAR | Status: DC | PRN
  Administered 2021-12-28: 50 ug via INTRAVENOUS
  Administered 2021-12-28: 100 ug via INTRAVENOUS

## 2021-12-28 MED ORDER — CEFAZOLIN SODIUM-DEXTROSE 2-4 GM/100ML-% IV SOLN
2.0000 g | INTRAVENOUS | Status: AC
  Administered 2021-12-28: 2 g via INTRAVENOUS
  Filled 2021-12-28: qty 100

## 2021-12-28 MED ORDER — PHENYLEPHRINE 80 MCG/ML (10ML) SYRINGE FOR IV PUSH (FOR BLOOD PRESSURE SUPPORT)
PREFILLED_SYRINGE | INTRAVENOUS | Status: DC | PRN
  Administered 2021-12-28: 80 ug via INTRAVENOUS
  Administered 2021-12-28: 120 ug via INTRAVENOUS
  Administered 2021-12-28 (×2): 80 ug via INTRAVENOUS

## 2021-12-28 MED ORDER — FENTANYL CITRATE (PF) 100 MCG/2ML IJ SOLN: 25.0000 ug | INTRAMUSCULAR | Status: DC | PRN

## 2021-12-28 MED ORDER — ESMOLOL HCL 100 MG/10ML IV SOLN
INTRAVENOUS | Status: DC | PRN
  Administered 2021-12-28: 40 mg via INTRAVENOUS
  Administered 2021-12-28: 20 mg via INTRAVENOUS
  Administered 2021-12-28: 40 mg via INTRAVENOUS

## 2021-12-28 MED ORDER — TRAZODONE HCL 100 MG PO TABS
100.0000 mg | ORAL_TABLET | Freq: Every evening | ORAL | Status: DC | PRN
Start: 2021-12-28 — End: 2021-12-29

## 2021-12-28 MED ORDER — HYDRALAZINE HCL 20 MG/ML IJ SOLN: 5.0000 mg | INTRAMUSCULAR | Status: DC | PRN

## 2021-12-28 MED ORDER — FENTANYL CITRATE (PF) 250 MCG/5ML IJ SOLN
INTRAMUSCULAR | Status: AC
  Filled 2021-12-28: qty 5

## 2021-12-28 MED ORDER — HEPARIN SODIUM (PORCINE) 1000 UNIT/ML IJ SOLN
INTRAMUSCULAR | Status: DC | PRN
  Administered 2021-12-28: 7000 [IU] via INTRAVENOUS
  Administered 2021-12-28: 3000 [IU] via INTRAVENOUS

## 2021-12-28 MED ORDER — IODIXANOL 320 MG/ML IV SOLN
INTRAVENOUS | Status: DC | PRN
  Administered 2021-12-28: 80.7 mL

## 2021-12-28 MED ORDER — PROTAMINE SULFATE 10 MG/ML IV SOLN
INTRAVENOUS | Status: DC | PRN
  Administered 2021-12-28: 10 mg via INTRAVENOUS
  Administered 2021-12-28 (×2): 20 mg via INTRAVENOUS

## 2021-12-28 MED ORDER — LABETALOL HCL 100 MG PO TABS
100.0000 mg | ORAL_TABLET | Freq: Once | ORAL | Status: AC
  Administered 2021-12-28: 100 mg via ORAL
  Filled 2021-12-28: qty 1

## 2021-12-28 MED ORDER — LACTATED RINGERS IV SOLN: INTRAVENOUS | Status: DC | PRN

## 2021-12-28 MED ORDER — AMLODIPINE BESYLATE 5 MG PO TABS
5.0000 mg | ORAL_TABLET | Freq: Every day | ORAL | Status: DC
  Administered 2021-12-28 – 2021-12-29 (×2): 5 mg via ORAL
  Filled 2021-12-28 (×2): qty 1

## 2021-12-28 MED ORDER — SODIUM CHLORIDE 0.9 % IV SOLN: INTRAVENOUS | Status: DC

## 2021-12-28 MED ORDER — LACTATED RINGERS IV SOLN: INTRAVENOUS | Status: DC

## 2021-12-28 MED ORDER — LIDOCAINE 2% (20 MG/ML) 5 ML SYRINGE
INTRAMUSCULAR | Status: DC | PRN
  Administered 2021-12-28: 60 mg via INTRAVENOUS

## 2021-12-28 MED ORDER — MIDAZOLAM HCL 2 MG/2ML IJ SOLN
INTRAMUSCULAR | Status: AC
  Filled 2021-12-28: qty 2

## 2021-12-28 MED ORDER — PHENOL 1.4 % MT LIQD: 1.0000 | OROMUCOSAL | Status: DC | PRN

## 2021-12-28 MED ORDER — LABETALOL HCL 100 MG PO TABS
100.0000 mg | ORAL_TABLET | Freq: Two times a day (BID) | ORAL | Status: DC
  Administered 2021-12-28 – 2021-12-29 (×2): 100 mg via ORAL
  Filled 2021-12-28 (×3): qty 1

## 2021-12-28 MED ORDER — OXYCODONE-ACETAMINOPHEN 5-325 MG PO TABS
1.0000 | ORAL_TABLET | ORAL | Status: DC | PRN
  Administered 2021-12-28: 1 via ORAL
  Filled 2021-12-28: qty 1

## 2021-12-28 MED ORDER — ACETAMINOPHEN 10 MG/ML IV SOLN: 1000.0000 mg | Freq: Once | INTRAVENOUS | Status: DC | PRN

## 2021-12-28 MED ORDER — DEXAMETHASONE SODIUM PHOSPHATE 10 MG/ML IJ SOLN
INTRAMUSCULAR | Status: DC | PRN
  Administered 2021-12-28: 10 mg via INTRAVENOUS

## 2021-12-28 MED ORDER — ENOXAPARIN SODIUM 40 MG/0.4ML IJ SOSY
40.0000 mg | PREFILLED_SYRINGE | INTRAMUSCULAR | Status: DC
  Administered 2021-12-29: 40 mg via SUBCUTANEOUS
  Filled 2021-12-28 (×2): qty 0.4

## 2021-12-28 MED ORDER — LABETALOL HCL 5 MG/ML IV SOLN: 10.0000 mg | INTRAVENOUS | Status: DC | PRN

## 2021-12-28 MED ORDER — ALUM & MAG HYDROXIDE-SIMETH 200-200-20 MG/5ML PO SUSP: 15.0000 mL | ORAL | Status: DC | PRN

## 2021-12-28 MED ORDER — MAGNESIUM SULFATE 2 GM/50ML IV SOLN: 2.0000 g | Freq: Every day | INTRAVENOUS | Status: DC | PRN

## 2021-12-28 MED ORDER — POLYETHYLENE GLYCOL 3350 17 G PO PACK: 17.0000 g | PACK | Freq: Every day | ORAL | Status: DC | PRN

## 2021-12-28 MED ORDER — PHENYLEPHRINE HCL-NACL 20-0.9 MG/250ML-% IV SOLN
INTRAVENOUS | Status: DC | PRN
  Administered 2021-12-28: 30 ug/min via INTRAVENOUS

## 2021-12-28 MED ORDER — ONDANSETRON HCL 4 MG/2ML IJ SOLN
4.0000 mg | Freq: Four times a day (QID) | INTRAMUSCULAR | Status: DC | PRN
Start: 2021-12-28 — End: 2021-12-29

## 2021-12-28 MED ORDER — LABETALOL HCL 5 MG/ML IV SOLN
INTRAVENOUS | Status: DC | PRN
  Administered 2021-12-28: 5 mg via INTRAVENOUS

## 2021-12-28 SURGICAL SUPPLY — 57 items
BAG COUNTER SPONGE SURGICOUNT (BAG) ×1 IMPLANT
CANISTER SUCT 3000ML PPV (MISCELLANEOUS) ×1 IMPLANT
CATH BEACON 5 .035 65 KMP TIP (CATHETERS) IMPLANT
CATH BEACON 5.038 65CM KMP-01 (CATHETERS) ×1 IMPLANT
CATH OMNI FLUSH .035X70CM (CATHETERS) ×1 IMPLANT
CLIP LIGATING EXTRA MED SLVR (CLIP) IMPLANT
CLIP LIGATING EXTRA SM BLUE (MISCELLANEOUS) IMPLANT
DERMABOND ADVANCED (GAUZE/BANDAGES/DRESSINGS) ×2
DERMABOND ADVANCED .7 DNX12 (GAUZE/BANDAGES/DRESSINGS) ×1 IMPLANT
DEVICE CLOSURE PERCLS PRGLD 6F (VASCULAR PRODUCTS) ×4 IMPLANT
DRSG TEGADERM 2-3/8X2-3/4 SM (GAUZE/BANDAGES/DRESSINGS) ×2 IMPLANT
DRYSEAL FLEXSHEATH 12FR 33CM (SHEATH) ×1
DRYSEAL FLEXSHEATH 18FR 33CM (SHEATH) ×1
ELECT REM PT RETURN 9FT ADLT (ELECTROSURGICAL) ×2
ELECTRODE REM PT RTRN 9FT ADLT (ELECTROSURGICAL) ×2 IMPLANT
EXCLDR TRNK 28.5X14.5X12 16F (Endovascular Graft) ×1 IMPLANT
EXCLUDER TNK 28.5X14.5X12 16F (Endovascular Graft) IMPLANT
GAUZE SPONGE 2X2 8PLY STRL LF (GAUZE/BANDAGES/DRESSINGS) ×1 IMPLANT
GLIDEWIRE ADV .035X180CM (WIRE) ×2 IMPLANT
GLOVE BIO SURGEON STRL SZ7.5 (GLOVE) ×1 IMPLANT
GLOVE BIOGEL PI IND STRL 8 (GLOVE) ×1 IMPLANT
GLOVE BIOGEL PI INDICATOR 8 (GLOVE) ×1
GLOVE SRG 8 PF TXTR STRL LF DI (GLOVE) ×1 IMPLANT
GLOVE SURG POLYISO LF SZ8 (GLOVE) IMPLANT
GLOVE SURG UNDER POLY LF SZ8 (GLOVE) ×1
GOWN STRL REUS W/ TWL LRG LVL3 (GOWN DISPOSABLE) ×3 IMPLANT
GOWN STRL REUS W/TWL 2XL LVL3 (GOWN DISPOSABLE) ×2 IMPLANT
GOWN STRL REUS W/TWL LRG LVL3 (GOWN DISPOSABLE) ×3
GRAFT BALLN CATH 65CM (STENTS) ×1 IMPLANT
KIT BASIN OR (CUSTOM PROCEDURE TRAY) ×1 IMPLANT
KIT DRAIN CSF ACCUDRAIN (MISCELLANEOUS) IMPLANT
KIT TURNOVER KIT B (KITS) ×1 IMPLANT
LEG CONTRALATERAL 16X16X13.5 (Endovascular Graft) ×1 IMPLANT
LEG CONTRALATERAL 16X16X9.5 (Endovascular Graft) IMPLANT
NS IRRIG 1000ML POUR BTL (IV SOLUTION) ×1 IMPLANT
PACK ENDOVASCULAR (PACKS) ×1 IMPLANT
PAD ARMBOARD 7.5X6 YLW CONV (MISCELLANEOUS) ×2 IMPLANT
PERCLOSE PROGLIDE 6F (VASCULAR PRODUCTS) ×4
SET MICROPUNCTURE 5F STIFF (MISCELLANEOUS) ×1 IMPLANT
SHEATH BRITE TIP 8FR 23CM (SHEATH) ×1 IMPLANT
SHEATH DRYSEAL FLEX 12FR 33CM (SHEATH) IMPLANT
SHEATH DRYSEAL FLEX 18FR 33CM (SHEATH) IMPLANT
SHEATH PINNACLE 8F 10CM (SHEATH) ×1 IMPLANT
SPONGE GAUZE 2X2 STER 10/PKG (GAUZE/BANDAGES/DRESSINGS) ×1
STENT GRAFT BALLN CATH 65CM (STENTS) ×1
STENT GRAFT CONTRALAT 16X13.5 (Endovascular Graft) IMPLANT
STOPCOCK MORSE 400PSI 3WAY (MISCELLANEOUS) ×1 IMPLANT
SUT MNCRL AB 4-0 PS2 18 (SUTURE) ×2 IMPLANT
SUT PROLENE 5 0 C 1 24 (SUTURE) IMPLANT
SUT VIC AB 2-0 CTX 36 (SUTURE) IMPLANT
SUT VIC AB 3-0 SH 27 (SUTURE)
SUT VIC AB 3-0 SH 27X BRD (SUTURE) IMPLANT
SYR 20ML LL LF (SYRINGE) ×1 IMPLANT
TOWEL GREEN STERILE (TOWEL DISPOSABLE) ×1 IMPLANT
TRAY FOLEY MTR SLVR 16FR STAT (SET/KITS/TRAYS/PACK) ×1 IMPLANT
TUBING HIGH PRESSURE 120CM (CONNECTOR) ×1 IMPLANT
WIRE BENTSON .035X145CM (WIRE) ×2 IMPLANT

## 2021-12-28 NOTE — Anesthesia Procedure Notes (Signed)
Arterial Line Insertion Start/End8/17/2023 7:00 AM Performed by: Garfield Cornea, CRNA, CRNA  Patient location: Pre-op. Preanesthetic checklist: patient identified, IV checked, site marked, risks and benefits discussed, surgical consent, monitors and equipment checked, pre-op evaluation, timeout performed and anesthesia consent Lidocaine 1% used for infiltration Right, radial was placed Catheter size: 20 G Hand hygiene performed  and maximum sterile barriers used   Attempts: 1 Procedure performed without using ultrasound guided technique. Following insertion, dressing applied and Biopatch. Post procedure assessment: normal and unchanged  Patient tolerated the procedure well with no immediate complications.

## 2021-12-28 NOTE — Progress Notes (Signed)
Pt arrived from ..PACU..., A/ox .4..pt denies any pain, MD aware,CCMD called. CHG bath given,no further needs at this time   

## 2021-12-28 NOTE — Anesthesia Procedure Notes (Signed)
Procedure Name: Intubation Date/Time: 12/28/2021 7:58 AM  Performed by: Carolan Clines, CRNAPre-anesthesia Checklist: Patient identified, Emergency Drugs available, Suction available and Patient being monitored Patient Re-evaluated:Patient Re-evaluated prior to induction Oxygen Delivery Method: Circle System Utilized Preoxygenation: Pre-oxygenation with 100% oxygen Induction Type: IV induction Ventilation: Mask ventilation without difficulty Laryngoscope Size: Glidescope and 3 Grade View: Grade I Tube type: Oral Tube size: 7.5 mm Number of attempts: 3 Airway Equipment and Method: Rigid stylet and Video-laryngoscopy Placement Confirmation: ETT inserted through vocal cords under direct vision, positive ETCO2 and breath sounds checked- equal and bilateral Secured at: 22 cm Tube secured with: Tape Dental Injury: Teeth and Oropharynx as per pre-operative assessment  Difficulty Due To: Difficulty was unanticipated and Difficult Airway- due to anterior larynx Comments: DL x 1 by CRNA with Mac 4, grade III view. DL x 1 by MDA with Miller 2, grade III view. Glidescope 3 used with successful intubation. Pt with anterior larynx and enlarged epiglottis.

## 2021-12-28 NOTE — Progress Notes (Signed)
   12/28/21 1100  Peripheral IV 12/28/21 18 G Right Forearm  Placement Date/Time: 12/28/21 0650   Size (Gauge): 18 G  Orientation: Right  Location: Forearm  Site Prep: Chlorhexidine (preferred);Skin Prep Completely Dry at the Time of First Skin Puncture  Local Anesthetic: Injectable - 2% Lidocaine  Insertion at...  Site Assessment Clean, Dry, Intact  Line Status Infusing  Peripheral IV 12/28/21 16 G Left Antecubital  Placement Date/Time: 12/28/21 0650   Size (Gauge): 16 G  Orientation: Left  Location: Antecubital  Site Prep: Chlorhexidine (preferred);Skin Prep Completely Dry at the Time of First Skin Puncture  Local Anesthetic: Injectable - 2% Lidocaine  Insertion...  Site Assessment Clean, Dry, Intact  Line Status Infusing  Arterial Line 12/28/21 Right Radial  Placement Date/Time: 12/28/21 (c) 0700   Time Out: Correct patient;Correct site;Correct procedure  Maximum sterile barrier precautions: Hand hygiene;Cap;Mask;Sterile gown;Sterile gloves;Large sterile sheet  Site Prep: Skin Prep Completely Dry at the T...  Site Assessment Clean, Dry, Intact  Line Status Pulsatile blood flow  Color/Movement/Sensation Capillary refill less than 3 sec  Dressing Type Transparent  Dressing Status Clean, Dry, Intact

## 2021-12-28 NOTE — Transfer of Care (Signed)
Immediate Anesthesia Transfer of Care Note  Patient: Angel Golden  Procedure(s) Performed: ABDOMINAL AORTIC ENDOVASCULAR STENT GRAFT  Patient Location: PACU  Anesthesia Type:General  Level of Consciousness: awake, alert  and oriented  Airway & Oxygen Therapy: Patient Spontanous Breathing  Post-op Assessment: Report given to RN and Post -op Vital signs reviewed and stable  Post vital signs: Reviewed and stable  Last Vitals:  Vitals Value Taken Time  BP 132/99 12/28/21 0935  Temp    Pulse 76 12/28/21 0943  Resp 17 12/28/21 0943  SpO2 95 % 12/28/21 0943  Vitals shown include unvalidated device data.  Last Pain:  Vitals:   12/28/21 0935  TempSrc:   PainSc: 4       Patients Stated Pain Goal: 3 (12/27/21 1305)  Complications:  Encounter Notable Events  Notable Event Outcome Phase Comment  Difficult to intubate - unexpected  Intraprocedure Filed from anesthesia note documentation.

## 2021-12-28 NOTE — Anesthesia Postprocedure Evaluation (Signed)
Anesthesia Post Note  Patient: Angel Golden  Procedure(s) Performed: ABDOMINAL AORTIC ENDOVASCULAR STENT GRAFT     Patient location during evaluation: PACU Anesthesia Type: General Level of consciousness: awake and alert Pain management: pain level controlled Vital Signs Assessment: post-procedure vital signs reviewed and stable Respiratory status: spontaneous breathing, nonlabored ventilation, respiratory function stable and patient connected to nasal cannula oxygen Cardiovascular status: blood pressure returned to baseline and stable Postop Assessment: no apparent nausea or vomiting Anesthetic complications: yes   Encounter Notable Events  Notable Event Outcome Phase Comment  Difficult to intubate - unexpected  Intraprocedure Filed from anesthesia note documentation.    Last Vitals:  Vitals:   12/28/21 1600 12/28/21 1638  BP: 129/64 132/84  Pulse: 78 78  Resp: 18 17  Temp:  36.9 C  SpO2: 100% 100%    Last Pain:  Vitals:   12/28/21 1638  TempSrc: Oral  PainSc:                  Nelle Don Julia Alkhatib

## 2021-12-28 NOTE — Op Note (Signed)
NAME: Angel Golden    MRN: [id number] DOB: 10-Jul-1960    DATE OF OPERATION: 12/28/2021  PREOP DIAGNOSIS:    6.2 cm asymptomatic infrarenal abdominal aneurysm  POSTOP DIAGNOSIS:    Same  PROCEDURE:    Bilateral ultrasound-guided, percutaneous common femoral artery access Endovascular aortic repair  SURGEON: Victorino Sparrow  ASSIST: Kayren Eaves, [place]  ANESTHESIA: General  EBL: 25 mL  INDICATIONS:    Angel Golden is a 61 y.o. male presenting with asymptomatic 6.2 cm infrarenal abdominal aneurysm.   In the interim, Angel Golden has seen cardiology, and has been cleared for open and endovascular surgery.  He has not used any cocaine since his last hospitalization.   I had a long conversation with Angel Golden regarding open abdominal aortic aneurysm repair as well as endovascular aortic repair.  We discussed the short-term and long-term morbidity mortality associated with both as well as the need for further intervention sometimes associated with endovascular aortic repair.  After discussing the risk and benefits of each, Angel Golden elected to pursue endovascular aortic repair.  FINDINGS:   6.2 cm infrarenal abdominal aneurysm  TECHNIQUE:   The patient was brought to the operating room after informed consent was obtained. After placement of IV's and arterial line general anesthesia was successfully induced. The patient was prepped and draped in the usual sterile fashion. Peri-operative antibiotics were given. A time-out was performed.   Bilateral percutaneous access of the CFA was obtained with ultrasound guidance. 2 perclose devices were placed in the pre-close technique and the arteriotomy was up-sized to a 7 french sheath over GWA wires. The patient was heparinized with 5000 units of IV heparin. A pigtale catheter was advanced into the aorta and an aortogram was obtained. This demonstrated widely patent renal arteries bilaterally and a long aortic neck. Bilateral hypogastric arteries were  patent. Length measurements from pre-op CT scan were confirmed. The right side sheath was up-sized to an 18 french dry seal sheath over a Glide Advantage wire. A  12cm GORE conformable main body was positioned in the infrarenal aorta. Another aortogram was performed under magnification to define the renal artery origins. The graft was deployed. The contralateral limb was selected with a vertebral catheter and soft glide wire. Once advanced into the graft and wire withdrawn the vertebral catheter was freely moving. An arteriogram was performed and documented the graft was in a good infrarenal location and that the right renal artery was patent. This was double confirmation that we had selected the contralateral limb. A 12 french sheath was then advanced into the contralateral limb over a Glide Advantage wire. A  37mm x 16.5cm limb was inserted on the left side and deployed per the IFU. The reminder of the main body was deployed, followed by right iliac limb extension-16 x 9.5 cm. A compliant balloon was used to dilate the main body proximal and distal seal zones as well as the contralateral overlap with the main body and the distal seal zone. A completion aortogram was obtained which revealed patent renal arteries and the left hypogastric arteries well as patent EVAR with no evidence of type I A, IB, or type III endoleak. This was felt to be an excellent result. The arteriotomies were closed over a wire using the previously placed perc-close devices with excellent hemostasis, prior to complete closure bilateral femoral arteriograms were obtained via a micropuncture sheath demonstrating adequate access within the common femoral arteries with no access site complications. Bilateral femoral angiogram showed patent femoral  bifurcations without access complication. Pedal pulses were checked and found to be no different from pre-op. 50 mg of protamine was given. Surgicel was packed into the sheath tracks. The  skin was closed with 4-0 monocryl suture. Dry sterile dressing was placed. General anesthesia was successfully terminated. The patient was transported to the recovery room in stable position   Ladonna Snide, MD [facility] DATE OF DICTATION:   12/28/2021

## 2021-12-28 NOTE — Telephone Encounter (Signed)
-----   Message from [facility], [place] sent at 12/28/2021  9:31 AM EDT ----- S/p EVAR 8/17   Follow up with Dr.Robins in 4 weeks with CTA abd/pelvis

## 2021-12-28 NOTE — Discharge Instructions (Signed)
  [facility] [place]   Discharge Instructions  Endovascular Aortic Aneurysm Repair  Please refer to the following instructions for your post-procedure care. Your surgeon or Physician Assistant will discuss any changes with you.  Activity  You are encouraged to walk as much as you can. You can slowly return to normal activities but must avoid strenuous activity and heavy lifting until your doctor tells you it's OK. Avoid activities such as vacuuming or swinging a gold club. It is normal to feel tired for several weeks after your surgery. Do not drive until your doctor gives the OK and you are no longer taking prescription pain medications. It is also normal to have difficulty with sleep habits, eating, and bowel movements after surgery. These will go away with time.  Bathing/Showering  Shower daily after you go home.  Do not soak in a bathtub, hot tub, or swim until the incision heals completely.  If you have incisions in your groin, wash the groin wounds with soap and water daily and pat dry. (No tub bath-only shower)  Then put a dry gauze or washcloth there to keep this area dry to help prevent wound infection daily and as needed.  Do not use Vaseline or neosporin on your incisions.  Only use soap and water on your incisions and then protect and keep dry.  Incision Care  Shower every day. Clean your incision with mild soap and water. Pat the area dry with a clean towel. You do not need a bandage unless otherwise instructed. Do not apply any ointments or creams to your incision. If you clothing is irritating, you may cover your incision with a dry gauze pad.  Diet  Resume your normal diet. There are no special food restrictions following this procedure. A low fat/low cholesterol diet is recommended for all patients with vascular disease. In order to heal from your surgery, it is CRITICAL to get adequate nutrition. Your body requires vitamins, minerals, and protein.  Vegetables are the best source of vitamins and minerals. Vegetables also provide the perfect balance of protein. Processed food has little nutritional value, so try to avoid this.  Medications  Resume taking all of your medications unless your doctor or nurse practitioner tells you not to. If your incision is causing pain, you may take over-the-counter pain relievers such as acetaminophen (Tylenol). If you were prescribed a stronger pain medication, please be aware these medications can cause nausea and constipation. Prevent nausea by taking the medication with a snack or meal. Avoid constipation by drinking plenty of fluids and eating foods with a high amount of fiber, such as fruits, vegetables, and grains.  Do not take Tylenol if you are taking prescription pain medications.   Follow up  Our office will schedule a follow-up appointment with a CT scan 3-4 weeks after your surgery.  Please call us immediately for any of the following conditions  Severe or worsening pain in your legs or feet or in your abdomen back or chest. Increased pain, redness, drainage (pus) from your incision site. Increased abdominal pain, bloating, nausea, vomiting or persistent diarrhea. Fever of 101 degrees or higher. Swelling in your leg (s),  Reduce your risk of vascular disease  Stop smoking. If you would like help call [facility] at 1-800-QUIT-NOW (1-800-784-8669) or [place] at 336-586-4000. Manage your cholesterol Maintain a desired weight Control your diabetes Keep your blood pressure down  If you have questions, please call the office at [phone].  

## 2021-12-28 NOTE — H&P (Signed)
Office Note   Patient seen and examined in preop holding.  No complaints. No changes to medication history or physical exam since last seen in clinic. After discussing the risks and benefits of EVAR for asymptomatic AAA, Angel Golden elected to proceed.   Victorino Sparrow MD   CC:  AAA 6.2cm Requesting Provider:  No ref. provider found  HPI: Angel Golden is a 61 y.o. (Jun 26, 1960) male presenting in follow up s/p recent hospital consultation for asymptomatic AAA.  Exam today, Angel Golden was doing well.  He had no complaints.  He denied back pain, abdominal pain, chest pain. He denies symptoms of claudication, ischemic rest pain, tissue loss.  No previous abdominal surgery  Smoker, daily aspirin, has not used cocaine since his hospitalization.   Past Medical History:  Diagnosis Date   Bone spur of other site    rt foot   Cirrhosis (HCC)    Hepatitis C    Hypertension    PTSD (post-traumatic stress disorder)     Past Surgical History:  Procedure Laterality Date   HAND SURGERY Right    HAND SURGERY Left     Social History   Socioeconomic History   Marital status: Single    Spouse name: Not on file   Number of children: 0   Years of education: Not on file   Highest education level: Not on file  Occupational History   Not on file  Tobacco Use   Smoking status: Some Days    Packs/day: 0.50    Years: 45.00    Total pack years: 22.50    Types: Cigarettes   Smokeless tobacco: Never  Vaping Use   Vaping Use: Never used  Substance and Sexual Activity   Alcohol use: Yes    Alcohol/week: 12.0 standard drinks of alcohol    Types: 12 Cans of beer per week    Comment: occ   Drug use: Yes    Types: Cocaine, Marijuana    Comment: Not since recent diagnosis   Sexual activity: Not on file  Other Topics Concern   Not on file  Social History Narrative   Not on file   Social Determinants of Health   Financial Resource Strain: Not on file  Food Insecurity: Not on file   Transportation Needs: Not on file  Physical Activity: Not on file  Stress: Not on file  Social Connections: Not on file  Intimate Partner Violence: Not on file   Family History  Problem Relation Age of Onset   Heart failure Mother    Stroke Father 79       first one   Heart failure Sister    Cancer - Lung Sister     Current Facility-Administered Medications  Medication Dose Route Frequency Provider Last Rate Last Admin   0.9 %  sodium chloride infusion   Intravenous Continuous Victorino Sparrow, MD       ceFAZolin (ANCEF) IVPB 2g/100 mL premix  2 g Intravenous 30 min Pre-Op Victorino Sparrow, MD       Chlorhexidine Gluconate Cloth 2 % PADS 6 each  6 each Topical Once Victorino Sparrow, MD       And   Chlorhexidine Gluconate Cloth 2 % PADS 6 each  6 each Topical Once Victorino Sparrow, MD       lactated ringers infusion   Intravenous Continuous Stoltzfus, Gregory P, DO        No Known Allergies   REVIEW OF SYSTEMS:  [  X] denotes positive finding, [ ]  denotes negative finding Cardiac  Comments:  Chest pain or chest pressure:    Shortness of breath upon exertion:    Short of breath when lying flat:    Irregular heart rhythm:        Vascular    Pain in calf, thigh, or hip brought on by ambulation:    Pain in feet at night that wakes you up from your sleep:     Blood clot in your veins:    Leg swelling:         Pulmonary    Oxygen at home:    Productive cough:     Wheezing:         Neurologic    Sudden weakness in arms or legs:     Sudden numbness in arms or legs:     Sudden onset of difficulty speaking or slurred speech:    Temporary loss of vision in one eye:     Problems with dizziness:         Gastrointestinal    Blood in stool:     Vomited blood:         Genitourinary    Burning when urinating:     Blood in urine:        Psychiatric    Major depression:         Hematologic    Bleeding problems:    Problems with blood clotting too easily:        Skin     Rashes or ulcers:        Constitutional    Fever or chills:      PHYSICAL EXAMINATION:  Vitals:   12/27/21 1254 12/28/21 0605 12/28/21 0649  BP:  (!) 131/99 (!) 141/108  Pulse:  67 77  Resp:  18   Temp:  98.2 F (36.8 C)   TempSrc:  Oral   SpO2:  96%   Weight: 70.8 kg 70.8 kg   Height: 5\' 10"  (1.778 m) 5\' 10"  (1.778 m)     General:  WDWN in NAD; vital signs documented above Gait: Not observed HENT: WNL, normocephalic Pulmonary: normal non-labored breathing , without wheezing Cardiac: regular HR,  Abdomen: soft, NT, no masses Skin: without rashes Vascular Exam/Pulses:  Right Left  Radial 2+ (normal) 2+ (normal)  Ulnar 2+ (normal) 2+ (normal)  Femoral 2+ (normal) 2+ (normal)  Popliteal    DP 2+ (normal) 2+ (normal)  PT 2+ (normal) 2+ (normal)   Extremities: without ischemic changes, without Gangrene , without cellulitis; without open wounds;  Musculoskeletal: no muscle wasting or atrophy  Neurologic: A&O X 3;  No focal weakness or paresthesias are detected Psychiatric:  The pt has Normal affect.   Non-Invasive Vascular Imaging:   0.3 cm infrarenal abdominal aneurysm    ASSESSMENT/PLAN: Angel Golden is a 61 y.o. male presenting with asymptomatic 6.2 cm infrarenal abdominal aneurysm.  In the interim, Angel Golden has seen cardiology, and has been cleared for open and endovascular surgery.  He has not used any cocaine since his last hospitalization.  I had a long conversation with Angel Golden regarding open abdominal aortic aneurysm repair as well as endovascular aortic repair.  We discussed the short-term and long-term morbidity mortality associated with both as well as the need for further intervention sometimes associated with endovascular aortic repair.  After discussing the risk and benefits of each, Angel Golden elected to pursue endovascular aortic repair.  His surgery is scheduled on 12/28/2021.  I asked  that he immediately call 911 should he have any symptoms in the  interim.   Victorino Sparrow, MD [facility] [phone]

## 2021-12-29 ENCOUNTER — Encounter (HOSPITAL_COMMUNITY): Payer: Self-pay | Admitting: Vascular Surgery

## 2021-12-29 LAB — CBC
HCT: 37.1 % — ABNORMAL LOW (ref 39.0–52.0)
Hemoglobin: 12.4 g/dL — ABNORMAL LOW (ref 13.0–17.0)
MCH: 30 pg (ref 26.0–34.0)
MCHC: 33.4 g/dL (ref 30.0–36.0)
MCV: 89.8 fL (ref 80.0–100.0)
Platelets: 208 10*3/uL (ref 150–400)
RBC: 4.13 MIL/uL — ABNORMAL LOW (ref 4.22–5.81)
RDW: 13 % (ref 11.5–15.5)
WBC: 9.7 10*3/uL (ref 4.0–10.5)
nRBC: 0 % (ref 0.0–0.2)

## 2021-12-29 LAB — BASIC METABOLIC PANEL
Anion gap: 11 (ref 5–15)
BUN: 8 mg/dL (ref 6–20)
CO2: 22 mmol/L (ref 22–32)
Calcium: 9.4 mg/dL (ref 8.9–10.3)
Chloride: 105 mmol/L (ref 98–111)
Creatinine, Ser: 0.83 mg/dL (ref 0.61–1.24)
GFR, Estimated: >60 mL/min (ref >60)
Glucose, Bld: 113 mg/dL — ABNORMAL HIGH (ref 70–99)
Potassium: 3.8 mmol/L (ref 3.5–5.1)
Sodium: 138 mmol/L (ref 135–145)

## 2021-12-29 MED ORDER — OXYCODONE-ACETAMINOPHEN 5-325 MG PO TABS: 1.0000 | ORAL_TABLET | Freq: Four times a day (QID) | ORAL | 0 refills | Status: DC | PRN

## 2021-12-29 NOTE — TOC Transition Note (Signed)
Transition of Care (TOC) - CM/SW Discharge Note Donn Pierini RN, BSN Transitions of Care Unit 4E- RN Case Manager See Treatment Team for direct phone #    Patient Details  Name: Angel Golden MRN: [id number] Date of Birth: 09-29-1960  Transition of Care [facility]) CM/SW Contact:  Darrold Span, RN Phone Number: 12/29/2021, 9:59 AM   Clinical Narrative:    Pt from home s/p EVAR, Transition of Care Department [facility]) has reviewed patient and no TOC needs have been identified at this time. Pt stable for transition home today  Final next level of care: Home/Self Care Barriers to Discharge: No Barriers Identified   Patient Goals and CMS Choice     Choice offered to / list presented to : NA  Discharge Placement             Home          Discharge Plan and Services                DME Arranged: N/A DME Agency: NA       HH Arranged: NA HH Agency: NA        Social Determinants of Health (SDOH) Interventions     Readmission Risk Interventions    12/29/2021    9:59 AM  Readmission Risk Prevention Plan  Post Dischage Appt Complete  Medication Screening Complete  Transportation Screening Complete

## 2021-12-29 NOTE — Progress Notes (Signed)
Doing well POD1 Groins soft Signals in feet Abdominal exam benign  Home today  Victorino Sparrow MD

## 2021-12-29 NOTE — Progress Notes (Signed)
  Progress Note    12/29/2021 7:45 AM 1 Day Post-Op  Subjective:  Patient reports some moderate pain in his incision sites but otherwise feels well. He has been walking around beside his bed    Vitals:   12/28/21 2352 12/29/21 0340  BP: 115/81 (!) 124/93  Pulse: 79 74  Resp: 14 18  Temp: 98.2 F (36.8 C) 97.8 F (36.6 C)  SpO2: 99% 100%    Physical Exam: Lungs:  nonlabored Incisions:  bilateral groin incisions c/d/l. L groin hematoma is soft and resolving Extremities:  Palpable DP pulses bilaterally Abdomen:  soft, nontender  CBC    Component Value Date/Time   WBC 9.7 12/29/2021 0312   RBC 4.13 (L) 12/29/2021 0312   HGB 12.4 (L) 12/29/2021 0312   HCT 37.1 (L) 12/29/2021 0312   PLT 208 12/29/2021 0312   MCV 89.8 12/29/2021 0312   MCH 30.0 12/29/2021 0312   MCHC 33.4 12/29/2021 0312   RDW 13.0 12/29/2021 0312   LYMPHSABS 1.2 12/03/2021 1522   MONOABS 0.4 12/03/2021 1522   EOSABS 0.0 12/03/2021 1522   BASOSABS 0.0 12/03/2021 1522    BMET    Component Value Date/Time   NA 138 12/29/2021 0312   K 3.8 12/29/2021 0312   CL 105 12/29/2021 0312   CO2 22 12/29/2021 0312   GLUCOSE 113 (H) 12/29/2021 0312   BUN 8 12/29/2021 0312   CREATININE 0.83 12/29/2021 0312   CALCIUM 9.4 12/29/2021 0312   GFRNONAA >60 12/29/2021 0312   GFRAA >60 06/02/2019 1204    INR    Component Value Date/Time   INR 1.2 12/28/2021 0950     Intake/Output Summary (Last 24 hours) at 12/29/2021 0745 Last data filed at 12/29/2021 0453 Gross per 24 hour  Intake 1300 ml  Output 2130 ml  Net -830 ml     Assessment/Plan:  61 y.o. male is 1 day post op, s/p: EVAR   -Patients pain is well controlled -L groin hematoma from surgery yesterday is resolving and still soft  -Bilateral groin incisions intact -Palpable DP pulses bilaterally -Will d/c home today. Follow up with Dr.Robins in 1 month  Loel Dubonnet, [place] [facility] [phone] 12/29/2021 7:45  AM

## 2022-01-01 NOTE — Discharge Summary (Signed)
EVAR Discharge Summary   Angel Golden [birth year] 61 y.o. male  MRN: [id number]  Admission Date: 12/28/2021  Discharge Date: 12/29/2021  Physician: Gerarda Fraction MD  Admission Diagnosis: PAD (peripheral artery disease) [facility]) [I73.9]  Discharge Day services:   PAD (peripheral artery disease) [facility]) [I73.9]  Hospital Course:  The patient was admitted to the hospital and taken to the operating room on 12/28/2021 and underwent endovascular repair of 6.2 cm infrarenal abdominal aneurysm.  Patient developed small, soft left groin hematoma during surgery.    The pt tolerated the procedure well and was transported to the PACU in excellent condition.   POD 1 the patient was moving around well.  His groin incisions were intact.  His left groin hematoma was still soft and going away.  The remainder of the hospital course consisted of increasing mobilization and increasing intake of solids without difficulty.  He was discharged home on POD 1.  CBC    Component Value Date/Time   WBC 9.7 12/29/2021 0312   RBC 4.13 (L) 12/29/2021 0312   HGB 12.4 (L) 12/29/2021 0312   HCT 37.1 (L) 12/29/2021 0312   PLT 208 12/29/2021 0312   MCV 89.8 12/29/2021 0312   MCH 30.0 12/29/2021 0312   MCHC 33.4 12/29/2021 0312   RDW 13.0 12/29/2021 0312   LYMPHSABS 1.2 12/03/2021 1522   MONOABS 0.4 12/03/2021 1522   EOSABS 0.0 12/03/2021 1522   BASOSABS 0.0 12/03/2021 1522    BMET    Component Value Date/Time   NA 138 12/29/2021 0312   K 3.8 12/29/2021 0312   CL 105 12/29/2021 0312   CO2 22 12/29/2021 0312   GLUCOSE 113 (H) 12/29/2021 0312   BUN 8 12/29/2021 0312   CREATININE 0.83 12/29/2021 0312   CALCIUM 9.4 12/29/2021 0312   GFRNONAA >60 12/29/2021 0312   GFRAA >60 06/02/2019 1204       Discharge Instructions     Call MD for:  redness, tenderness, or signs of infection (pain, swelling, redness, odor or green/yellow discharge around incision site)   Complete by: As directed    Call MD  for:  severe uncontrolled pain   Complete by: As directed    Call MD for:  temperature >100.4   Complete by: As directed    Diet - low sodium heart healthy   Complete by: As directed    Discharge wound care:   Complete by: As directed    Keep clean and dry til office visit. Wash with warm water and soap, pat dry.   Increase activity slowly   Complete by: As directed        Discharge Diagnosis:  PAD (peripheral artery disease) (HCC) [I73.9]  Secondary Diagnosis: Patient Active Problem List   Diagnosis Date Noted   PAD (peripheral artery disease) (HCC) 12/28/2021   Preop cardiovascular exam 12/07/2021   Mixed hyperlipidemia 12/07/2021   Infrarenal abdominal aortic aneurysm (AAA) without rupture (HCC) 12/04/2021   Hypertension 12/04/2021   Left lower quadrant abdominal pain    MDD (major depressive disorder), recurrent severe, without psychosis (HCC) 05/09/2020   Polysubstance dependence (HCC) 02/27/2013   Unspecified episodic mood disorder 02/27/2013   PTSD (post-traumatic stress disorder) 02/27/2013   Past Medical History:  Diagnosis Date   Bone spur of other site    rt foot   Cirrhosis (HCC)    Hepatitis C    Hypertension    PTSD (post-traumatic stress disorder)      Allergies as of 12/29/2021   No Known  Allergies      Medication List     TAKE these medications    amLODipine 5 MG tablet Commonly known as: NORVASC Take 1 tablet (5 mg total) by mouth daily.   aspirin EC 81 MG tablet Take 1 tablet (81 mg total) by mouth daily. Swallow whole.   atorvastatin 40 MG tablet Commonly known as: LIPITOR Take 1 tablet (40 mg total) by mouth daily.   B-12 PO Take 1 tablet by mouth daily.   hydrocortisone 2.5 % rectal cream Commonly known as: ANUSOL-HC Place 1 application  rectally 2 (two) times daily. Patient would like his medicines mailed to him What changed:  when to take this reasons to take this   ibuprofen 600 MG tablet Commonly known as: ADVIL Take  600 mg by mouth 3 (three) times daily as needed (pain.).   labetalol 100 MG tablet Commonly known as: NORMODYNE Take 1 tablet (100 mg total) by mouth 2 (two) times daily.   oxyCODONE-acetaminophen 5-325 MG tablet Commonly known as: PERCOCET/ROXICET Take 1 tablet by mouth every 6 (six) hours as needed for moderate pain.   traZODone 100 MG tablet Commonly known as: DESYREL Take 1 tablet (100 mg total) by mouth at bedtime as needed for sleep.   witch hazel-glycerin pad Commonly known as: TUCKS Apply 1 Application topically 4 (four) times daily as needed (irritaiton after bowel movement).               Discharge Care Instructions  (From admission, onward)           Start     Ordered   12/29/21 0000  Discharge wound care:       Comments: Keep clean and dry til office visit. Wash with warm water and soap, pat dry.   12/29/21 0749            Discharge Instructions:   [facility]  Discharge Instructions Endovascular Aortic Aneurysm Repair  Please refer to the following instructions for your post-procedure care. Your surgeon or Physician Assistant will discuss any changes with you.  Activity  You are encouraged to walk as much as you can. You can slowly return to normal activities but must avoid strenuous activity and heavy lifting until your doctor tells you it's OK. Avoid activities such as vacuuming or swinging a gold club. It is normal to feel tired for several weeks after your surgery. Do not drive until your doctor gives the OK and you are no longer taking prescription pain medications. It is also normal to have difficulty with sleep habits, eating, and bowel movements after surgery. These will go away with time.  Bathing/Showering  You may shower after you go home. If you have an incision, do not soak in a bathtub, hot tub, or swim until the incision heals completely.  Incision Care  Shower every day. Clean your incision with  mild soap and water. Pat the area dry with a clean towel. You do not need a bandage unless otherwise instructed. Do not apply any ointments or creams to your incision. If you clothing is irritating, you may cover your incision with a dry gauze pad.  Diet  Resume your normal diet. There are no special food restrictions following this procedure. A low fat/low cholesterol diet is recommended for all patients with vascular disease. In order to heal from your surgery, it is CRITICAL to get adequate nutrition. Your body requires vitamins, minerals, and protein. Vegetables are the best source of vitamins  and minerals. Vegetables also provide the perfect balance of protein. Processed food has little nutritional value, so try to avoid this.  Medications  Resume taking all of your medications unless your doctor or Physician Assistnat tells you not to. If your incision is causing pain, you may take over-the-counter pain relievers such as acetaminophen (Tylenol). If you were prescribed a stronger pain medication, please be aware these medications can cause nausea and constipation. Prevent nausea by taking the medication with a snack or meal. Avoid constipation by drinking plenty of fluids and eating foods with a high amount of fiber, such as fruits, vegetables, and grains. Do not take Tylenol if you are taking prescription pain medications.   Follow up  Our office will schedule a follow-up appointment with a C.T. scan 3-4 weeks after your surgery.  Please call us immediately for any of the following conditions  Severe or worsening pain in your legs or feet or in your abdomen back or chest. Increased pain, redness, drainage (pus) from your incision sit. Increased abdominal pain, bloating, nausea, vomiting or persistent diarrhea. Fever of 101 degrees or higher. Swelling in your leg (s),  Reduce your risk of vascular disease  Stop smoking. If you would like help call [facility] at 1-800-QUIT-NOW  (506-816-1145) or [place] at [phone]. Manage your cholesterol Maintain a desired weight Control your diabetes Keep your blood pressure down  If you have questions, please call the office at [phone].   Disposition: Home  Patient's condition: is Excellent  Follow up: 1. Dr. Karin Lieu in 4 weeks with CTA protocol   Loel Dubonnet, PA-C [facility] [phone] 01/01/2022  10:53 AM   - For VQI Registry use - Post-op:  Time to Extubation: [x]  In OR, [ ]  < 12 hrs, [ ]  12-24 hrs, [ ]  >=24 hrs Vasopressors Req. Post-op: No MI: No., [ ]  Troponin only, [ ]  EKG or Clinical New Arrhythmia: No CHF: No ICU Stay: 0 days Transfusion: No     If yes, units given  Complications: Resp failure: No., [ ]  Pneumonia, [ ]  Ventilator Chg in renal function: No., [ ]  Inc. Cr > 0.5, [ ]  Temp. Dialysis,  [ ]  Permanent dialysis Leg ischemia: No., no Surgery needed, [ ]  Yes, Surgery needed,  [ ]  Amputation Bowel ischemia: No., [ ]  Medical Rx, [ ]  Surgical Rx Wound complication: No., [ ]  Superficial separation/infection, [ ]  Return to OR Return to OR: No  Return to OR for bleeding: No Stroke: No., [ ]  Minor, [ ]  Major  Discharge medications: Statin use:  Yes  ASA use:  Yes  Plavix use:  No  Beta blocker use:  Yes  ARB use:  No ACEI use:  No CCB use:  Yes

## 2022-01-05 NOTE — Telephone Encounter (Signed)
Appt has been scheduled.

## 2022-01-10 ENCOUNTER — Other Ambulatory Visit: Payer: Self-pay

## 2022-01-10 DIAGNOSIS — I7143 Infrarenal abdominal aortic aneurysm, without rupture: Secondary | ICD-10-CM

## 2022-01-11 ENCOUNTER — Ambulatory Visit
Admission: RE | Admit: 2022-01-11 | Discharge: 2022-01-11 | Disposition: A | Discharge status: Home / Self Care | Payer: 59 | Source: Ambulatory Visit | Attending: Vascular Surgery | Admitting: Vascular Surgery

## 2022-01-11 DIAGNOSIS — I7143 Infrarenal abdominal aortic aneurysm, without rupture: Secondary | ICD-10-CM

## 2022-01-11 DIAGNOSIS — M2578 Osteophyte, vertebrae: Secondary | ICD-10-CM | POA: Diagnosis not present

## 2022-01-11 DIAGNOSIS — I714 Abdominal aortic aneurysm, without rupture, unspecified: Secondary | ICD-10-CM | POA: Diagnosis not present

## 2022-01-11 DIAGNOSIS — K429 Umbilical hernia without obstruction or gangrene: Secondary | ICD-10-CM | POA: Diagnosis not present

## 2022-01-11 DIAGNOSIS — I868 Varicose veins of other specified sites: Secondary | ICD-10-CM | POA: Diagnosis not present

## 2022-01-11 MED ORDER — IOPAMIDOL (ISOVUE-370) INJECTION 76%
75.0000 mL | Freq: Once | INTRAVENOUS | Status: AC | PRN
  Administered 2022-01-11: 75 mL via INTRAVENOUS

## 2022-01-25 NOTE — Progress Notes (Deleted)
Office Note     CC:  AAA 6.2cm Requesting Provider:  [facility]  HPI: Angel Golden is a 61 y.o. ([birth year]) male presenting in follow up s/p 12/28/21 [facility] for asymptomatic AAA.  On exam today, ***     Past Medical History:  Diagnosis Date   Bone spur of other site    rt foot   Cirrhosis ([place])    Hepatitis C    Hypertension    PTSD (post-traumatic stress disorder)     Past Surgical History:  Procedure Laterality Date   ABDOMINAL AORTIC ENDOVASCULAR STENT GRAFT N/A 12/28/2021   Procedure: ABDOMINAL AORTIC ENDOVASCULAR STENT GRAFT;  Surgeon: Broadus John, MD;  Location: [facility];  Service: Vascular;  Laterality: N/A;   HAND SURGERY Right    HAND SURGERY Left     Social History   Socioeconomic History   Marital status: Single    Spouse name: Not on file   Number of children: 0   Years of education: Not on file   Highest education level: Not on file  Occupational History   Not on file  Tobacco Use   Smoking status: Some Days    Packs/day: 0.50    Years: 45.00    Total pack years: 22.50    Types: Cigarettes   Smokeless tobacco: Never  Vaping Use   Vaping Use: Never used  Substance and Sexual Activity   Alcohol use: Yes    Alcohol/week: 12.0 standard drinks of alcohol    Types: 12 Cans of beer per week    Comment: occ   Drug use: Yes    Types: Cocaine, Marijuana    Comment: Not since recent diagnosis   Sexual activity: Not on file  Other Topics Concern   Not on file  Social History Narrative   Not on file   Social Determinants of Health   Financial Resource Strain: Not on file  Food Insecurity: Not on file  Transportation Needs: Not on file  Physical Activity: Not on file  Stress: Not on file  Social Connections: Not on file  Intimate Partner Violence: Not on file   Family History  Problem Relation Age of Onset   Heart failure Mother    Stroke Father 62       first one   Heart failure Sister    Cancer - Lung Sister     Current  Outpatient Medications  Medication Sig Dispense Refill   amLODipine (NORVASC) 5 MG tablet Take 1 tablet (5 mg total) by mouth daily. 30 tablet 1   aspirin EC 81 MG tablet Take 1 tablet (81 mg total) by mouth daily. Swallow whole. 90 tablet 3   atorvastatin (LIPITOR) 40 MG tablet Take 1 tablet (40 mg total) by mouth daily. 30 tablet 1   Cyanocobalamin (B-12 PO) Take 1 tablet by mouth daily.     hydrocortisone (ANUSOL-HC) 2.5 % rectal cream Place 1 application  rectally 2 (two) times daily. Patient would like his medicines mailed to him (Patient taking differently: Place 1 application  rectally 2 (two) times daily as needed for hemorrhoids. Patient would like his medicines mailed to him) 30 g 0   ibuprofen (ADVIL) 600 MG tablet Take 600 mg by mouth 3 (three) times daily as needed (pain.).     labetalol (NORMODYNE) 100 MG tablet Take 1 tablet (100 mg total) by mouth 2 (two) times daily. 60 tablet 3   oxyCODONE-acetaminophen (PERCOCET/ROXICET) 5-325 MG tablet Take 1 tablet by mouth every 6 (  six) hours as needed for moderate pain. 6 tablet 0   traZODone (DESYREL) 100 MG tablet Take 1 tablet (100 mg total) by mouth at bedtime as needed for sleep. 30 tablet 0   witch hazel-glycerin (TUCKS) pad Apply 1 Application topically 4 (four) times daily as needed (irritaiton after bowel movement).     No current facility-administered medications for this visit.    No Known Allergies   REVIEW OF SYSTEMS:  [X]  denotes positive finding, [ ]  denotes negative finding Cardiac  Comments:  Chest pain or chest pressure:    Shortness of breath upon exertion:    Short of breath when lying flat:    Irregular heart rhythm:        Vascular    Pain in calf, thigh, or hip brought on by ambulation:    Pain in feet at night that wakes you up from your sleep:     Blood clot in your veins:    Leg swelling:         Pulmonary    Oxygen at home:    Productive cough:     Wheezing:         Neurologic    Sudden weakness  in arms or legs:     Sudden numbness in arms or legs:     Sudden onset of difficulty speaking or slurred speech:    Temporary loss of vision in one eye:     Problems with dizziness:         Gastrointestinal    Blood in stool:     Vomited blood:         Genitourinary    Burning when urinating:     Blood in urine:        Psychiatric    Major depression:         Hematologic    Bleeding problems:    Problems with blood clotting too easily:        Skin    Rashes or ulcers:        Constitutional    Fever or chills:      PHYSICAL EXAMINATION:  There were no vitals filed for this visit.  General:  WDWN in NAD; vital signs documented above Gait: Not observed HENT: WNL, normocephalic Pulmonary: normal non-labored breathing , without wheezing Cardiac: regular HR,  Abdomen: soft, NT, no masses Skin: without rashes Vascular Exam/Pulses:  Right Left  Radial 2+ (normal) 2+ (normal)  Ulnar 2+ (normal) 2+ (normal)  Femoral 2+ (normal) 2+ (normal)  Popliteal    DP 2+ (normal) 2+ (normal)  PT 2+ (normal) 2+ (normal)   Extremities: without ischemic changes, without Gangrene , without cellulitis; without open wounds;  Musculoskeletal: no muscle wasting or atrophy  Neurologic: A&O X 3;  No focal weakness or paresthesias are detected Psychiatric:  The pt has Normal affect.   Non-Invasive Vascular Imaging:   0.3 cm infrarenal abdominal aneurysm    ASSESSMENT/PLAN: Angel Golden is a 61 y.o. male presenting in follow up s/p 12/28/21 [facility] for asymptomatic 6.2cm AAA.  In the interim, Angel Golden has seen cardiology, and has been cleared for open and endovascular surgery.  He has not used any cocaine since his last hospitalization.  I had a long conversation with Angel Golden regarding open abdominal aortic aneurysm repair as well as endovascular aortic repair.  We discussed the short-term and long-term morbidity mortality associated with both as well as the need for further intervention  sometimes associated with endovascular aortic repair.  After discussing the risk and benefits of each, Angel Golden elected to pursue endovascular aortic repair.  His surgery is scheduled on 12/28/2021.  I asked that he immediately call 911 should he have any symptoms in the interim.   Angel Sparrow, MD [facility] [phone]

## 2022-01-26 ENCOUNTER — Encounter: Payer: 59 | Admitting: Vascular Surgery

## 2022-03-26 DIAGNOSIS — K0889 Other specified disorders of teeth and supporting structures: Secondary | ICD-10-CM | POA: Diagnosis not present

## 2022-04-20 DIAGNOSIS — F1420 Cocaine dependence, uncomplicated: Secondary | ICD-10-CM | POA: Diagnosis not present

## 2022-04-20 DIAGNOSIS — F1220 Cannabis dependence, uncomplicated: Secondary | ICD-10-CM | POA: Diagnosis not present

## 2022-04-20 DIAGNOSIS — F1020 Alcohol dependence, uncomplicated: Secondary | ICD-10-CM | POA: Diagnosis not present

## 2022-05-01 DIAGNOSIS — Z599 Problem related to housing and economic circumstances, unspecified: Secondary | ICD-10-CM | POA: Diagnosis not present

## 2022-05-06 ENCOUNTER — Inpatient Hospital Stay (HOSPITAL_COMMUNITY)
Admission: EM | Admit: 2022-05-06 | Discharge: 2022-05-09 | DRG: 439 | Disposition: A | Discharge status: Home / Self Care | Payer: 59 | Attending: Internal Medicine | Admitting: Internal Medicine

## 2022-05-06 ENCOUNTER — Emergency Department (HOSPITAL_COMMUNITY): Payer: 59

## 2022-05-06 ENCOUNTER — Other Ambulatory Visit: Payer: Self-pay

## 2022-05-06 ENCOUNTER — Encounter (HOSPITAL_COMMUNITY): Payer: Self-pay | Admitting: Family Medicine

## 2022-05-06 DIAGNOSIS — Z7151 Drug abuse counseling and surveillance of drug abuser: Secondary | ICD-10-CM

## 2022-05-06 DIAGNOSIS — K852 Alcohol induced acute pancreatitis without necrosis or infection: Secondary | ICD-10-CM | POA: Diagnosis not present

## 2022-05-06 DIAGNOSIS — Z743 Need for continuous supervision: Secondary | ICD-10-CM | POA: Diagnosis not present

## 2022-05-06 DIAGNOSIS — I868 Varicose veins of other specified sites: Secondary | ICD-10-CM | POA: Diagnosis present

## 2022-05-06 DIAGNOSIS — F1010 Alcohol abuse, uncomplicated: Secondary | ICD-10-CM

## 2022-05-06 DIAGNOSIS — F17210 Nicotine dependence, cigarettes, uncomplicated: Secondary | ICD-10-CM | POA: Diagnosis present

## 2022-05-06 DIAGNOSIS — I10 Essential (primary) hypertension: Secondary | ICD-10-CM | POA: Diagnosis present

## 2022-05-06 DIAGNOSIS — K760 Fatty (change of) liver, not elsewhere classified: Secondary | ICD-10-CM | POA: Diagnosis present

## 2022-05-06 DIAGNOSIS — R079 Chest pain, unspecified: Secondary | ICD-10-CM | POA: Diagnosis not present

## 2022-05-06 DIAGNOSIS — F1410 Cocaine abuse, uncomplicated: Secondary | ICD-10-CM | POA: Diagnosis present

## 2022-05-06 DIAGNOSIS — I7143 Infrarenal abdominal aortic aneurysm, without rupture: Secondary | ICD-10-CM | POA: Diagnosis not present

## 2022-05-06 DIAGNOSIS — R69 Illness, unspecified: Secondary | ICD-10-CM | POA: Diagnosis not present

## 2022-05-06 DIAGNOSIS — K7460 Unspecified cirrhosis of liver: Secondary | ICD-10-CM | POA: Diagnosis present

## 2022-05-06 DIAGNOSIS — F319 Bipolar disorder, unspecified: Secondary | ICD-10-CM | POA: Diagnosis present

## 2022-05-06 DIAGNOSIS — Z8679 Personal history of other diseases of the circulatory system: Secondary | ICD-10-CM

## 2022-05-06 DIAGNOSIS — K859 Acute pancreatitis without necrosis or infection, unspecified: Secondary | ICD-10-CM | POA: Diagnosis not present

## 2022-05-06 DIAGNOSIS — R109 Unspecified abdominal pain: Secondary | ICD-10-CM | POA: Insufficient documentation

## 2022-05-06 DIAGNOSIS — Z91148 Patient's other noncompliance with medication regimen for other reason: Secondary | ICD-10-CM

## 2022-05-06 DIAGNOSIS — K766 Portal hypertension: Secondary | ICD-10-CM | POA: Diagnosis present

## 2022-05-06 DIAGNOSIS — F1210 Cannabis abuse, uncomplicated: Secondary | ICD-10-CM | POA: Diagnosis present

## 2022-05-06 DIAGNOSIS — Z7982 Long term (current) use of aspirin: Secondary | ICD-10-CM

## 2022-05-06 DIAGNOSIS — I739 Peripheral vascular disease, unspecified: Secondary | ICD-10-CM | POA: Diagnosis not present

## 2022-05-06 DIAGNOSIS — Z8249 Family history of ischemic heart disease and other diseases of the circulatory system: Secondary | ICD-10-CM

## 2022-05-06 DIAGNOSIS — Z79891 Long term (current) use of opiate analgesic: Secondary | ICD-10-CM

## 2022-05-06 DIAGNOSIS — Z79899 Other long term (current) drug therapy: Secondary | ICD-10-CM

## 2022-05-06 DIAGNOSIS — I700 Atherosclerosis of aorta: Secondary | ICD-10-CM | POA: Diagnosis not present

## 2022-05-06 DIAGNOSIS — K0889 Other specified disorders of teeth and supporting structures: Secondary | ICD-10-CM | POA: Diagnosis not present

## 2022-05-06 DIAGNOSIS — E785 Hyperlipidemia, unspecified: Secondary | ICD-10-CM | POA: Diagnosis present

## 2022-05-06 DIAGNOSIS — I213 ST elevation (STEMI) myocardial infarction of unspecified site: Secondary | ICD-10-CM | POA: Diagnosis not present

## 2022-05-06 DIAGNOSIS — R1013 Epigastric pain: Secondary | ICD-10-CM | POA: Diagnosis not present

## 2022-05-06 DIAGNOSIS — I2510 Atherosclerotic heart disease of native coronary artery without angina pectoris: Secondary | ICD-10-CM | POA: Diagnosis not present

## 2022-05-06 DIAGNOSIS — F4310 Post-traumatic stress disorder, unspecified: Secondary | ICD-10-CM | POA: Diagnosis present

## 2022-05-06 DIAGNOSIS — F10139 Alcohol abuse with withdrawal, unspecified: Secondary | ICD-10-CM | POA: Diagnosis not present

## 2022-05-06 LAB — COMPREHENSIVE METABOLIC PANEL
ALT: 21 U/L (ref 0–44)
AST: 32 U/L (ref 15–41)
Albumin: 4.5 g/dL (ref 3.5–5.0)
Alkaline Phosphatase: 72 U/L (ref 38–126)
Anion gap: 12 (ref 5–15)
BUN: 12 mg/dL (ref 6–20)
CO2: 18 mmol/L — ABNORMAL LOW (ref 22–32)
Calcium: 9.8 mg/dL (ref 8.9–10.3)
Chloride: 107 mmol/L (ref 98–111)
Creatinine, Ser: 0.92 mg/dL (ref 0.61–1.24)
GFR, Estimated: >60 mL/min (ref >60)
Glucose, Bld: 116 mg/dL — ABNORMAL HIGH (ref 70–99)
Potassium: 3.9 mmol/L (ref 3.5–5.1)
Sodium: 137 mmol/L (ref 135–145)
Total Bilirubin: 0.8 mg/dL (ref 0.3–1.2)
Total Protein: 7.5 g/dL (ref 6.5–8.1)

## 2022-05-06 LAB — CBC WITH DIFFERENTIAL/PLATELET
Abs Immature Granulocytes: 0.02 10*3/uL (ref 0.00–0.07)
Basophils Absolute: 0 10*3/uL (ref 0.0–0.1)
Basophils Relative: 0 %
Eosinophils Absolute: 0.1 10*3/uL (ref 0.0–0.5)
Eosinophils Relative: 1 %
HCT: 47.2 % (ref 39.0–52.0)
Hemoglobin: 16.2 g/dL (ref 13.0–17.0)
Immature Granulocytes: 0 %
Lymphocytes Relative: 32 %
Lymphs Abs: 2 10*3/uL (ref 0.7–4.0)
MCH: 30.2 pg (ref 26.0–34.0)
MCHC: 34.3 g/dL (ref 30.0–36.0)
MCV: 88.1 fL (ref 80.0–100.0)
Monocytes Absolute: 0.4 10*3/uL (ref 0.1–1.0)
Monocytes Relative: 6 %
Neutro Abs: 3.6 10*3/uL (ref 1.7–7.7)
Neutrophils Relative %: 61 %
Platelets: 219 10*3/uL (ref 150–400)
RBC: 5.36 MIL/uL (ref 4.22–5.81)
RDW: 14 % (ref 11.5–15.5)
WBC: 6 10*3/uL (ref 4.0–10.5)
nRBC: 0 % (ref 0.0–0.2)

## 2022-05-06 LAB — I-STAT CHEM 8, ED
BUN: 12 mg/dL (ref 6–20)
Calcium, Ion: 1.18 mmol/L (ref 1.15–1.40)
Chloride: 106 mmol/L (ref 98–111)
Creatinine, Ser: 0.8 mg/dL (ref 0.61–1.24)
Glucose, Bld: 119 mg/dL — ABNORMAL HIGH (ref 70–99)
HCT: 49 % (ref 39.0–52.0)
Hemoglobin: 16.7 g/dL (ref 13.0–17.0)
Potassium: 3.8 mmol/L (ref 3.5–5.1)
Sodium: 141 mmol/L (ref 135–145)
TCO2: 22 mmol/L (ref 22–32)

## 2022-05-06 LAB — TROPONIN I (HIGH SENSITIVITY)
Troponin I (High Sensitivity): 8 ng/L (ref <18)
Troponin I (High Sensitivity): 8 ng/L (ref <18)

## 2022-05-06 LAB — LIPASE, BLOOD: Lipase: 756 U/L — ABNORMAL HIGH (ref 11–51)

## 2022-05-06 MED ORDER — MORPHINE SULFATE (PF) 4 MG/ML IV SOLN
4.0000 mg | Freq: Once | INTRAVENOUS | Status: AC
  Administered 2022-05-06: 4 mg via INTRAVENOUS
  Filled 2022-05-06: qty 1

## 2022-05-06 MED ORDER — HYDROMORPHONE HCL 1 MG/ML IJ SOLN
1.0000 mg | Freq: Once | INTRAMUSCULAR | Status: AC | PRN
  Administered 2022-05-06: 1 mg via INTRAVENOUS
  Filled 2022-05-06: qty 1

## 2022-05-06 MED ORDER — MORPHINE SULFATE (PF) 2 MG/ML IV SOLN: 2.0000 mg | INTRAVENOUS | Status: DC | PRN

## 2022-05-06 MED ORDER — LORAZEPAM 2 MG/ML IJ SOLN
1.0000 mg | Freq: Four times a day (QID) | INTRAMUSCULAR | Status: DC | PRN
  Administered 2022-05-07 – 2022-05-08 (×2): 2 mg via INTRAVENOUS
  Filled 2022-05-06 (×2): qty 1

## 2022-05-06 MED ORDER — HYDRALAZINE HCL 20 MG/ML IJ SOLN: 5.0000 mg | Freq: Four times a day (QID) | INTRAMUSCULAR | Status: DC | PRN

## 2022-05-06 MED ORDER — HYDRALAZINE HCL 20 MG/ML IJ SOLN
5.0000 mg | Freq: Four times a day (QID) | INTRAMUSCULAR | Status: DC | PRN
  Administered 2022-05-07 – 2022-05-08 (×2): 5 mg via INTRAVENOUS
  Filled 2022-05-06 (×2): qty 1

## 2022-05-06 MED ORDER — AMLODIPINE BESYLATE 5 MG PO TABS
5.0000 mg | ORAL_TABLET | Freq: Every day | ORAL | Status: DC
  Administered 2022-05-06 – 2022-05-07 (×2): 5 mg via ORAL
  Filled 2022-05-06 (×3): qty 1

## 2022-05-06 MED ORDER — ONDANSETRON HCL 4 MG/2ML IJ SOLN
4.0000 mg | Freq: Once | INTRAMUSCULAR | Status: AC
  Administered 2022-05-06: 4 mg via INTRAVENOUS
  Filled 2022-05-06: qty 2

## 2022-05-06 MED ORDER — ATORVASTATIN CALCIUM 40 MG PO TABS
40.0000 mg | ORAL_TABLET | Freq: Every day | ORAL | Status: DC
  Administered 2022-05-07 – 2022-05-09 (×3): 40 mg via ORAL
  Filled 2022-05-06 (×3): qty 1

## 2022-05-06 MED ORDER — SODIUM CHLORIDE 0.9 % IV SOLN: INTRAVENOUS | Status: AC

## 2022-05-06 MED ORDER — LORAZEPAM 1 MG PO TABS: 1.0000 mg | ORAL_TABLET | Freq: Four times a day (QID) | ORAL | Status: DC | PRN

## 2022-05-06 MED ORDER — LACTATED RINGERS IV BOLUS
2000.0000 mL | Freq: Once | INTRAVENOUS | Status: AC
  Administered 2022-05-06: 2000 mL via INTRAVENOUS

## 2022-05-06 MED ORDER — HYDROMORPHONE HCL 1 MG/ML IJ SOLN
1.0000 mg | Freq: Once | INTRAMUSCULAR | Status: AC
  Administered 2022-05-06: 1 mg via INTRAVENOUS

## 2022-05-06 MED ORDER — HYDROMORPHONE HCL 1 MG/ML IJ SOLN
1.0000 mg | INTRAMUSCULAR | Status: DC | PRN
  Administered 2022-05-07: 1 mg via INTRAVENOUS
  Filled 2022-05-06 (×2): qty 1

## 2022-05-06 MED ORDER — GADOBUTROL 1 MMOL/ML IV SOLN
7.0000 mL | Freq: Once | INTRAVENOUS | Status: AC | PRN
  Administered 2022-05-06: 7 mL via INTRAVENOUS

## 2022-05-06 MED ORDER — IOHEXOL 350 MG/ML SOLN
75.0000 mL | Freq: Once | INTRAVENOUS | Status: AC | PRN
  Administered 2022-05-06: 75 mL via INTRAVENOUS

## 2022-05-06 MED ORDER — ENOXAPARIN SODIUM 40 MG/0.4ML IJ SOSY
40.0000 mg | PREFILLED_SYRINGE | INTRAMUSCULAR | Status: DC
  Administered 2022-05-06 – 2022-05-08 (×3): 40 mg via SUBCUTANEOUS
  Filled 2022-05-06 (×3): qty 0.4

## 2022-05-06 MED ORDER — ASPIRIN 81 MG PO CHEW
81.0000 mg | CHEWABLE_TABLET | Freq: Every day | ORAL | Status: DC
  Administered 2022-05-07 – 2022-05-09 (×3): 81 mg via ORAL
  Filled 2022-05-06 (×3): qty 1

## 2022-05-06 MED ORDER — ONDANSETRON HCL 4 MG/2ML IJ SOLN
4.0000 mg | Freq: Four times a day (QID) | INTRAMUSCULAR | Status: DC | PRN
  Administered 2022-05-07 – 2022-05-08 (×2): 4 mg via INTRAVENOUS
  Filled 2022-05-06 (×2): qty 2

## 2022-05-06 NOTE — Assessment & Plan Note (Signed)
S/p endovascular repair on 12/2021. CTA A/P today shows stable graft stent. 

## 2022-05-06 NOTE — H&P (Signed)
History and Physical    Patient: Angel Golden HQI:[id number] DOB: [birth year] DOA: 05/06/2022 DOS: the patient was seen and examined on 05/06/2022 PCP: [facility]  Patient coming from: Home  Chief Complaint: No chief complaint on file.  HPI: Angel Golden is a 61 y.o. male with medical history significant of AAA s/p endovascular repair (12/2021), HTN, PAD, Cirrhosis, polysubstance abuse (ETOH, cocaine, THC) , PTSD, MDD, HLD, bipolar disorder who presents with epigastric pain, nausea and vomiting.   Pt reports severe epigastric pain with radiation down mid-abdominal associated with nausea and vomiting starting this morning follow breakfast. No fever. Only abdominal surgery is AAA repair in August. Reports almost daily alcohol use and had a few shots of liquor yesterday. Also used Marijuana and cocaine sometime this week. Never had pancreatitis.  Has not been able to take his home medications.  In the ED, he initially presented with elevated BP of 200/130. No leukocytosis, hemoglobin appears hemoconcentrated at 16.7 up from a prior of 12.4.  CMP revealing for lipase of 756 but had normal LFTs and total bilirubin.  CTA chest/abdomen/pelvis obtained due to his recent AAA repair.  It was revealing for possible choledocholithiasis/pancreatic duct stone with possible pancreatitis.  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  Patient has been given IV morphine, IV fluid and antiemetics in the ED.  ED PA discussed with [facility] who recommended MRCP and will see in consultation in the morning.   Review of Systems: As mentioned in the history of present illness. All other systems reviewed and are negative. Past Medical History:  Diagnosis Date   Bone spur of other site    rt foot   Cirrhosis (HCC)    Hepatitis C    Hypertension    PTSD (post-traumatic stress disorder)    Past Surgical History:  Procedure  Laterality Date   ABDOMINAL AORTIC ENDOVASCULAR STENT GRAFT N/A 12/28/2021   Procedure: ABDOMINAL AORTIC ENDOVASCULAR STENT GRAFT;  Surgeon: Victorino Sparrow, MD;  [facility]: [facility];  Service: Vascular;  Laterality: N/A;   HAND SURGERY Right    HAND SURGERY Left    Social History:  reports that he has been smoking cigarettes. He has a 22.50 pack-year smoking history. He has never used smokeless tobacco. He reports current alcohol use of about 12.0 standard drinks of alcohol per week. He reports current drug use. Drugs: Cocaine and Marijuana.  No Known Allergies  Family History  Problem Relation Age of Onset   Heart failure Mother    Stroke Father 46       first one   Heart failure Sister    Cancer - Lung Sister     Prior to Admission medications   Medication Sig Start Date End Date Taking? Authorizing Provider  amLODipine (NORVASC) 5 MG tablet Take 1 tablet (5 mg total) by mouth daily. 10/25/21   Merrilee Jansky, MD  aspirin EC 81 MG tablet Take 1 tablet (81 mg total) by mouth daily. Swallow whole. 12/07/21   Patwardhan, Anabel Bene, MD  atorvastatin (LIPITOR) 40 MG tablet Take 1 tablet (40 mg total) by mouth daily. 12/05/21   Dameron, Nolberto Hanlon, DO  Cyanocobalamin (B-12 PO) Take 1 tablet by mouth daily.    [provider]  hydrocortisone (ANUSOL-HC) 2.5 % rectal cream Place 1 application  rectally 2 (two) times daily. Patient would like his medicines mailed to him Patient taking differently: Place 1 application  rectally 2 (two) times  daily as needed for hemorrhoids. Patient would like his medicines mailed to him 10/25/21   Merrilee Jansky, MD  ibuprofen (ADVIL) 600 MG tablet Take 600 mg by mouth 3 (three) times daily as needed (pain.).    [provider]  labetalol (NORMODYNE) 100 MG tablet Take 1 tablet (100 mg total) by mouth 2 (two) times daily. 12/07/21   Patwardhan, Anabel Bene, MD  oxyCODONE-acetaminophen (PERCOCET/ROXICET) 5-325 MG tablet Take 1 tablet by mouth every 6  (six) hours as needed for moderate pain. 12/29/21   Schuh, McKenzi P, PA-C  traZODone (DESYREL) 100 MG tablet Take 1 tablet (100 mg total) by mouth at bedtime as needed for sleep. 05/10/20   Money, Gerlene Burdock, FNP  witch hazel-glycerin (TUCKS) pad Apply 1 Application topically 4 (four) times daily as needed (irritaiton after bowel movement).    [provider]    Physical Exam: Vitals:   05/06/22 1600 05/06/22 1800 05/06/22 1900 05/06/22 2037  BP: (!) 156/104 (!) 143/96  (!) 185/128  Pulse: 70 73  88  Resp: 14 13  15   Temp:   97.9 F (36.6 C) (!) 97.5 F (36.4 C)  TempSrc:    Oral  SpO2: 98% 97%  98%   Constitutional: NAD, elderly male appearing younger than stated age in comfortable and restless due to abdominal pain Eyes: lids and conjunctivae normal ENMT: Mucous membranes are moist.  Neck: normal, supple,  Respiratory: clear to auscultation bilaterally, no wheezing, no crackles. Normal respiratory effort. No accessory muscle use.  Cardiovascular: Regular rate and rhythm, no murmurs / rubs / gallops. No extremity edema.   Abdomen: Firm, tense abdomen with moderate tenderness throughout.  No rebound tenderness, guarding or rigidity.  Bowel sounds positive.  Musculoskeletal: no clubbing / cyanosis. No joint deformity upper and lower extremities. Good ROM, no contractures. Normal muscle tone.  Skin: no rashes, lesions, ulcers.  Neurologic: CN 2-12 grossly intact.  Strength 5/5 in all 4.  Psychiatric: Normal judgment and insight. Alert and oriented x 3. Normal mood. Data Reviewed:  See HPI  Assessment and Plan: * Pancreatitis -initial CTA A/P concerning for possible choledocholithiasis. MRCP now revealing for only pancreatitis. Suspect symptoms due to his alcohol use.  -Last triglyceride levels a few months ago was within normal limits  -Full liquid diet for now and advance as tolerated -continuous aggressive fluid hydration overnight -PRN IV opioids for pain -[facility]  consulted by ED PA overnight but should not need to re-consult in the morning unless he does not clinically improve overnight.   Alcohol abuse -reports drinking several shots of liquor almost daily. No signs of withdrawal -monitor on CIWA protocol  PAD (peripheral artery disease) (HCC) -continue daily aspirin and statin  Hypertension -elevated since pt has missed his medications today and is having significant abdominal pain. -continue pain control -continue home amlodipine 5mg . Not able to verify his other medicines right now so will add PRN for SBP >170.   Infrarenal abdominal aortic aneurysm (AAA) without rupture [facility]) S/p endovascular repair on 12/2021. CTA A/P today shows stable graft stent.      Advance Care Planning: Full  Consults: [facility]  Family Communication: None at bedside  Severity of Illness: The appropriate patient status for this patient is OBSERVATION. Observation status is judged to be reasonable and necessary in order to provide the required intensity of service to ensure the patient's safety. The patient's presenting symptoms, physical exam findings, and initial radiographic and laboratory data in the context of their  medical condition is felt to place them at decreased risk for further clinical deterioration. Furthermore, it is anticipated that the patient will be medically stable for discharge from the hospital within 2 midnights of admission.   Author: Anselm Jungling, DO 05/06/2022 8:43 PM  For on call review [email or web address].

## 2022-05-06 NOTE — Assessment & Plan Note (Addendum)
-  continue daily aspirin and statin

## 2022-05-06 NOTE — ED Notes (Signed)
Report given to inpatient nurse at this time.

## 2022-05-06 NOTE — Assessment & Plan Note (Signed)
-  initial CTA A/P concerning for possible choledocholithiasis. MRCP now revealing for only pancreatitis. Suspect symptoms due to his alcohol use.  -Last triglyceride levels a few months ago was within normal limits  -Full liquid diet for now and advance as tolerated -continuous aggressive fluid hydration overnight -PRN IV opioids for pain -[facility] consulted by ED PA overnight but should not need to re-consult in the morning unless he does not clinically improve overnight.

## 2022-05-06 NOTE — ED Notes (Signed)
Patient transported to CT 

## 2022-05-06 NOTE — ED Provider Notes (Signed)
[facility] EMERGENCY DEPARTMENT Provider Note   CSN: [id number] Arrival date & time: 05/06/22  1348     History  No chief complaint on file.   Angel Golden is a 61 y.o. male.  Patient presents emergency department via EMS complaining of epigastric abdominal pain/chest pain.  Patient appears to be highly uncomfortable and significantly diaphoretic upon arrival.  EMS administered 324 of aspirin and 2 doses of nitroglycerin prior to arrival at the emergency department.  Patient states that these did not improve his pain.  Pain is rated 10 out of 10 in severity.  Patient describes it as coming in waves.  He denies back pain, urinary symptoms.  He does endorse nausea, vomiting.  Patient has history of hypertensive and significantly hypertensive at this time.  Upon arrival patient's blood pressure was 165/117.  EMS reported initial blood pressure of 200/130, 149/83 after nitroglycerin.  Patient with history of infrarenal abdominal aortic aneurysm with repair earlier this year, polysubstance dependence, mood disorder, PTSD, major depressive disorder, hypertension, hepatitis C, cirrhosis of the liver  HPI     Home Medications Prior to Admission medications   Medication Sig Start Date End Date Taking? Authorizing Provider  amLODipine (NORVASC) 5 MG tablet Take 1 tablet (5 mg total) by mouth daily. 10/25/21   Merrilee Jansky, MD  aspirin EC 81 MG tablet Take 1 tablet (81 mg total) by mouth daily. Swallow whole. 12/07/21   Patwardhan, Anabel Bene, MD  atorvastatin (LIPITOR) 40 MG tablet Take 1 tablet (40 mg total) by mouth daily. 12/05/21   Dameron, Nolberto Hanlon, DO  Cyanocobalamin (B-12 PO) Take 1 tablet by mouth daily.    [provider]  hydrocortisone (ANUSOL-HC) 2.5 % rectal cream Place 1 application  rectally 2 (two) times daily. Patient would like his medicines mailed to him Patient taking differently: Place 1 application  rectally 2 (two) times daily as needed for  hemorrhoids. Patient would like his medicines mailed to him 10/25/21   Merrilee Jansky, MD  ibuprofen (ADVIL) 600 MG tablet Take 600 mg by mouth 3 (three) times daily as needed (pain.).    [provider]  labetalol (NORMODYNE) 100 MG tablet Take 1 tablet (100 mg total) by mouth 2 (two) times daily. 12/07/21   Patwardhan, Anabel Bene, MD  oxyCODONE-acetaminophen (PERCOCET/ROXICET) 5-325 MG tablet Take 1 tablet by mouth every 6 (six) hours as needed for moderate pain. 12/29/21   Schuh, McKenzi P, PA-C  traZODone (DESYREL) 100 MG tablet Take 1 tablet (100 mg total) by mouth at bedtime as needed for sleep. 05/10/20   Money, Gerlene Burdock, FNP  witch hazel-glycerin (TUCKS) pad Apply 1 Application topically 4 (four) times daily as needed (irritaiton after bowel movement).    [provider]      Allergies    Patient has no known allergies.    Review of Systems   Review of Systems  Respiratory:  Positive for shortness of breath.   Cardiovascular:  Positive for chest pain. Negative for palpitations and leg swelling.  Gastrointestinal:  Positive for abdominal pain, nausea and vomiting. Negative for diarrhea.  Genitourinary:  Negative for dysuria and flank pain.  Neurological:  Negative for headaches.    Physical Exam Updated Vital Signs BP (!) 176/113   Pulse 74   Temp 98 F (36.7 C)   Resp (!) 25   SpO2 97%  Physical Exam Vitals and nursing note reviewed.  Constitutional:      General: He is in acute  distress.     Appearance: He is well-developed. He is ill-appearing and diaphoretic.  HENT:     Head: Normocephalic and atraumatic.  Eyes:     Conjunctiva/sclera: Conjunctivae normal.  Cardiovascular:     Rate and Rhythm: Regular rhythm. Tachycardia present.     Heart sounds: No murmur heard. Pulmonary:     Effort: Pulmonary effort is normal. No respiratory distress.     Breath sounds: Normal breath sounds.  Abdominal:     Palpations: Abdomen is soft.     Tenderness: There  is abdominal tenderness (Generalized). There is no right CVA tenderness or left CVA tenderness.  Musculoskeletal:        General: No swelling.     Cervical back: Neck supple.  Skin:    General: Skin is warm.     Capillary Refill: Capillary refill takes less than 2 seconds.  Neurological:     Mental Status: He is alert.  Psychiatric:        Mood and Affect: Mood normal.     ED Results / Procedures / Treatments   Labs (all labs ordered are listed, but only abnormal results are displayed) Labs Reviewed  I-STAT CHEM 8, ED - Abnormal; Notable for the following components:      Result Value   Glucose, Bld 119 (*)    All other components within normal limits  CBC WITH DIFFERENTIAL/PLATELET  COMPREHENSIVE METABOLIC PANEL  LIPASE, BLOOD  URINALYSIS, ROUTINE W REFLEX MICROSCOPIC  TROPONIN I (HIGH SENSITIVITY)  TROPONIN I (HIGH SENSITIVITY)    EKG EKG Interpretation  Date/Time:  Sunday May 06 2022 14:19:36 EST Ventricular Rate:  85 PR Interval:  141 QRS Duration: 136 QT Interval:  396 QTC Calculation: 471 R Axis:   115 Text Interpretation: Sinus rhythm Consider left atrial enlargement IVCD, consider atypical RBBB Abnormal T, consider ischemia, lateral leads Baseline wander in lead(s) V2 similar to prior Confirmed by Tanda Rockers ([id number]) on 05/06/2022 2:37:02 PM  Radiology DG Chest Port 1 View  Result Date: 05/06/2022 CLINICAL DATA:  Chest pain EXAM: PORTABLE CHEST 1 VIEW COMPARISON:  12/03/2021 FINDINGS: The heart size and mediastinal contours are within normal limits. Both lungs are clear. Few old healed fractures are seen in left ribs. IMPRESSION: No active disease. Electronically Signed   By: Ernie Avena M.D.   On: 05/06/2022 14:42    Procedures Procedures    Medications Ordered in ED Medications  morphine (PF) 4 MG/ML injection 4 mg (4 mg Intravenous Given 05/06/22 1408)  ondansetron (ZOFRAN) injection 4 mg (4 mg Intravenous Given 05/06/22 1408)   HYDROmorphone (DILAUDID) injection 1 mg (1 mg Intravenous Given 05/06/22 1451)  iohexol (OMNIPAQUE) 350 MG/ML injection 75 mL (75 mLs Intravenous Contrast Given 05/06/22 1509)    ED Course/ Medical Decision Making/ A&P                           Medical Decision Making Amount and/or Complexity of Data Reviewed Labs: ordered. Radiology: ordered.  Risk Prescription drug management.   This patient presents to the ED for concern of abdominal pain, this involves an extensive number of treatment options, and is a complaint that carries with it a high risk of complications and morbidity.  The differential diagnosis includes dissection, ACS, pancreatitis, cholecystitis, appendicitis, diverticulitis, PE, pneumonia, and others   Co morbidities that complicate the patient evaluation  History of infrarenal aneurysm, substance abuse   Additional history obtained:  Additional history obtained from EMS External  records from outside source obtained and reviewed including records from vascular surgery with documentation of stenting with Dr. Karin Lieu  I ordered and reviewed labs.  Unremarkable CBC, i-STAT Chem-8.  Troponin, urinalysis, lipase, CMP pending   I ordered and interpreted imaging including chest x-ray which showed no active disease. CT angio chest/abd/pelvis dissection study pending.   Problem List / ED Course / Critical interventions / Medication management   I ordered medication including morphine and Dilaudid for pain, Zofran for nausea Reevaluation of the patient after these medicines showed that the patient improved I have reviewed the patients home medicines and have made adjustments as needed  Test / Admission - Considered:  Labs and CT dissection study pending at this time.  Patient care being transferred to Marita Kansas, PA-C at shift handoff. Disposition pending results of CT and labs. Differential still broad at this time.         Final Clinical Impression(s) / ED  Diagnoses Final diagnoses:  None    Rx / DC Orders ED Discharge Orders     None         Pamala Duffel 05/06/22 1536    Tanda Rockers A, DO 05/06/22 1704

## 2022-05-06 NOTE — ED Triage Notes (Addendum)
Pt BIB [facility] from home for CP/epigastric pain beginning around 7am after eating a bowl of serial.  It has not resolved.  EMS gave 324 ASA and 2 nitro. Pain has not improved but BP has.  Pt has hx of GERD and stent placement.  PT is currently 10/10 pain.  A&O x4. Pt is HTN with hx of same.  Has not had BP meds today.  PT was HTN at 200/130, 149/83 after nitro. Hr 76 CBG 117.

## 2022-05-06 NOTE — ED Provider Notes (Signed)
Signout received on this 61 year old male who presented with epigastric abdominal pain which started after breakfast and progressively worsened until just prior to arrival when it became severe.  He was associated with diaphoresis, nausea, and 1 episode of emesis.  This was nonbloody.  Denies prior history of pancreatitis.  He had history of abdominal aortic aneurysm without rupture which was repaired with an endovascular stent graft in August 2023.  At the time of signout patient is awaiting most of his labs as well as a dissection study. Physical Exam  BP (!) 156/104   Pulse 70   Temp 98 F (36.7 C)   Resp 14   SpO2 98%     Procedures  Procedures  ED Course / MDM    Medical Decision Making Amount and/or Complexity of Data Reviewed Labs: ordered. Radiology: ordered.  Risk Prescription drug management. Decision regarding hospitalization.   Dissection study shows endovascular stent graft repair of the infrarenal abdominal aorta without evidence of endoleak.  It does show evidence of potential choledocholithiasis.  Will further evaluate with MRCP.  Case discussed with gastroenterology who will follow in consult.  Will discuss with hospitalist for admission.  2 L of fluid bolus ordered.  Currently reports decent pain control following IV morphine and dose of Dilaudid.  Hemodynamically stable. Discussed with hospitalist who will evaluate patient for admission.       Marita Kansas, PA-C 05/06/22 1907    Sloan Leiter, DO 05/08/22 1620

## 2022-05-06 NOTE — Assessment & Plan Note (Signed)
-  reports drinking several shots of liquor almost daily. No signs of withdrawal -monitor on CIWA protocol

## 2022-05-06 NOTE — Assessment & Plan Note (Addendum)
-  elevated since pt has missed his medications today and is having significant abdominal pain. -continue pain control -continue home amlodipine 5mg . Not able to verify his other medicines right now so will add PRN for SBP >170.

## 2022-05-07 DIAGNOSIS — I739 Peripheral vascular disease, unspecified: Secondary | ICD-10-CM | POA: Diagnosis not present

## 2022-05-07 DIAGNOSIS — I868 Varicose veins of other specified sites: Secondary | ICD-10-CM | POA: Diagnosis not present

## 2022-05-07 DIAGNOSIS — K766 Portal hypertension: Secondary | ICD-10-CM | POA: Diagnosis not present

## 2022-05-07 DIAGNOSIS — K0889 Other specified disorders of teeth and supporting structures: Secondary | ICD-10-CM | POA: Diagnosis not present

## 2022-05-07 DIAGNOSIS — F17210 Nicotine dependence, cigarettes, uncomplicated: Secondary | ICD-10-CM | POA: Diagnosis not present

## 2022-05-07 DIAGNOSIS — F1410 Cocaine abuse, uncomplicated: Secondary | ICD-10-CM | POA: Diagnosis not present

## 2022-05-07 DIAGNOSIS — K760 Fatty (change of) liver, not elsewhere classified: Secondary | ICD-10-CM | POA: Diagnosis not present

## 2022-05-07 DIAGNOSIS — I10 Essential (primary) hypertension: Secondary | ICD-10-CM | POA: Diagnosis not present

## 2022-05-07 DIAGNOSIS — Z8249 Family history of ischemic heart disease and other diseases of the circulatory system: Secondary | ICD-10-CM | POA: Diagnosis not present

## 2022-05-07 DIAGNOSIS — F319 Bipolar disorder, unspecified: Secondary | ICD-10-CM | POA: Diagnosis not present

## 2022-05-07 DIAGNOSIS — Z79899 Other long term (current) drug therapy: Secondary | ICD-10-CM | POA: Diagnosis not present

## 2022-05-07 DIAGNOSIS — K7460 Unspecified cirrhosis of liver: Secondary | ICD-10-CM | POA: Diagnosis not present

## 2022-05-07 DIAGNOSIS — Z91148 Patient's other noncompliance with medication regimen for other reason: Secondary | ICD-10-CM | POA: Diagnosis not present

## 2022-05-07 DIAGNOSIS — K859 Acute pancreatitis without necrosis or infection, unspecified: Secondary | ICD-10-CM | POA: Diagnosis not present

## 2022-05-07 DIAGNOSIS — E785 Hyperlipidemia, unspecified: Secondary | ICD-10-CM | POA: Diagnosis not present

## 2022-05-07 DIAGNOSIS — F1210 Cannabis abuse, uncomplicated: Secondary | ICD-10-CM | POA: Diagnosis not present

## 2022-05-07 DIAGNOSIS — R69 Illness, unspecified: Secondary | ICD-10-CM | POA: Diagnosis not present

## 2022-05-07 DIAGNOSIS — F10139 Alcohol abuse with withdrawal, unspecified: Secondary | ICD-10-CM | POA: Diagnosis not present

## 2022-05-07 DIAGNOSIS — Z8679 Personal history of other diseases of the circulatory system: Secondary | ICD-10-CM | POA: Diagnosis not present

## 2022-05-07 DIAGNOSIS — Z79891 Long term (current) use of opiate analgesic: Secondary | ICD-10-CM | POA: Diagnosis not present

## 2022-05-07 DIAGNOSIS — R109 Unspecified abdominal pain: Secondary | ICD-10-CM | POA: Diagnosis not present

## 2022-05-07 DIAGNOSIS — Z7151 Drug abuse counseling and surveillance of drug abuser: Secondary | ICD-10-CM | POA: Diagnosis not present

## 2022-05-07 DIAGNOSIS — F4310 Post-traumatic stress disorder, unspecified: Secondary | ICD-10-CM | POA: Diagnosis not present

## 2022-05-07 DIAGNOSIS — Z7982 Long term (current) use of aspirin: Secondary | ICD-10-CM | POA: Diagnosis not present

## 2022-05-07 DIAGNOSIS — K852 Alcohol induced acute pancreatitis without necrosis or infection: Secondary | ICD-10-CM | POA: Diagnosis not present

## 2022-05-07 LAB — COMPREHENSIVE METABOLIC PANEL
ALT: 19 U/L (ref 0–44)
AST: 20 U/L (ref 15–41)
Albumin: 3.8 g/dL (ref 3.5–5.0)
Alkaline Phosphatase: 63 U/L (ref 38–126)
Anion gap: 6 (ref 5–15)
BUN: 8 mg/dL (ref 6–20)
CO2: 28 mmol/L (ref 22–32)
Calcium: 9.2 mg/dL (ref 8.9–10.3)
Chloride: 103 mmol/L (ref 98–111)
Creatinine, Ser: 0.96 mg/dL (ref 0.61–1.24)
GFR, Estimated: >60 mL/min (ref >60)
Glucose, Bld: 104 mg/dL — ABNORMAL HIGH (ref 70–99)
Potassium: 3.9 mmol/L (ref 3.5–5.1)
Sodium: 137 mmol/L (ref 135–145)
Total Bilirubin: 0.9 mg/dL (ref 0.3–1.2)
Total Protein: 6.8 g/dL (ref 6.5–8.1)

## 2022-05-07 LAB — CBC
HCT: 45.6 % (ref 39.0–52.0)
Hemoglobin: 15 g/dL (ref 13.0–17.0)
MCH: 29.7 pg (ref 26.0–34.0)
MCHC: 32.9 g/dL (ref 30.0–36.0)
MCV: 90.3 fL (ref 80.0–100.0)
Platelets: 207 10*3/uL (ref 150–400)
RBC: 5.05 MIL/uL (ref 4.22–5.81)
RDW: 14 % (ref 11.5–15.5)
WBC: 6.7 10*3/uL (ref 4.0–10.5)
nRBC: 0 % (ref 0.0–0.2)

## 2022-05-07 LAB — LIPASE, BLOOD: Lipase: 1230 U/L — ABNORMAL HIGH (ref 11–51)

## 2022-05-07 NOTE — Progress Notes (Addendum)
PROGRESS NOTE    Angel Golden  EXB:[id number] DOB: 02/01/1961 DOA: 05/06/2022 [facility]CP: [facility]   Brief Narrative:  Angel Golden is a 61 y.o. male with medical history significant of AAA s/p endovascular repair (12/2021), HTN, PAD, Cirrhosis, polysubstance abuse (ETOH, cocaine, THC) , PTSD, MDD, HLD, bipolar disorder who presents with epigastric pain, nausea and vomiting.   In the ED, he initially presented with elevated BP of 200/130. No leukocytosis, hemoglobin appears hemoconcentrated at 16.7 up from a prior of 12.4.CMP revealing for lipase of 756 but had normal LFTs and total bilirubin.CTA chest/abdomen/pelvis obtained due to his recent AAA repair.  It was revealing for possible choledocholithiasis/pancreatic duct stone with possible pancreatitis.  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 Patient has been given IV morphine, IV fluid and antiemetics in the ED.  ED PA discussed with [facility] who recommended MRCP-that resulted negative for choledocholithiasis.  Patient admitted for further evaluation and management for pancreatitis.  Assessment & Plan:   Acute pancreatitis: -In the setting of alcoholic use.  Lipase elevated. -MRCP negative for choledocholithiasis revealing only pancreatitis. -Continue aggressive hydration, as needed opioids. -Advance diet to GI soft diet today.  Zofran as needed for nausea and vomiting. -Liver enzymes are within normal limits  Alcohol abuse: -No signs of withdrawal.  Continue CIWA protocol  PAD: Continue aspirin and statins.  Hypertension: Elevated upon arrival likely due to pain and noncompliance. -Continue amlodipine, hydralazine as needed.  Infrarenal abdominal aortic aneurysm (AAA) without rupture [facility]) S/p endovascular repair on 12/2021. CTA A/P today shows stable graft stent.   Tobacco abuse: Recommend cessation  Polysubstance abuse: History of marijuana and  cocaine abuse.  Recommend cessation  DVT prophylaxis: Lovenox Code Status: Full code Family Communication:  None present at bedside.  Plan of care discussed with patient in length and he verbalized understanding and agreed with it. Disposition Plan: Home  Consultants:  GI  Procedures:  None  Antimicrobials:  None  Status is: Observation   Subjective: Patient seen and examined.  Resting comfortably on the bed.  Continues to have epigastric pain, denies fever, chills, nausea or vomiting.  Objective: Vitals:   05/06/22 1900 05/06/22 2037 05/07/22 0430 05/07/22 0809  BP:  (!) 185/128 (!) 148/97 (!) 154/110  Pulse:  88 71 75  Resp:  15 18 17   Temp: 97.9 F (36.6 C) (!) 97.5 F (36.4 C) 98.3 F (36.8 C) 98.5 F (36.9 C)  TempSrc:  Oral Oral Oral  SpO2:  98% 100% 98%   No intake or output data in the 24 hours ending 05/07/22 1118 There were no vitals filed for this visit.  Examination:  General exam: Appears calm and comfortable, on room air, appears weak, sick and dehydrated Respiratory system: Clear to auscultation. Respiratory effort normal. Cardiovascular system: S1 & S2 heard, RRR. No JVD, murmurs, rubs, gallops or clicks. No pedal edema. Gastrointestinal system: Abdomen soft, epigastric tenderness positive, no guarding, no rigidity, bowel sounds positive  Central nervous system: Alert and oriented. No focal neurological deficits. Extremities: Symmetric 5 x 5 power. Skin: No rashes, lesions or ulcers Psychiatry: Judgement and insight appear normal. Mood & affect appropriate.    Data Reviewed: I have personally reviewed following labs and imaging studies  CBC: Recent Labs  Lab 05/06/22 1403 05/06/22 1440 05/07/22 0211  WBC 6.0  --  6.7  NEUTROABS 3.6  --   --   HGB 16.2 16.7 15.0  HCT 47.2  49.0 45.6  MCV 88.1  --  90.3  PLT 219  --  207   Basic Metabolic Panel: Recent Labs  Lab 05/06/22 1403 05/06/22 1440 05/07/22 0211  NA 137 141 137  K 3.9 3.8  3.9  CL 107 106 103  CO2 18*  --  28  GLUCOSE 116* 119* 104*  BUN 12 12 8   CREATININE 0.92 0.80 0.96  CALCIUM 9.8  --  9.2   GFR: CrCl cannot be calculated (Unknown ideal weight.). Liver Function Tests: Recent Labs  Lab 05/06/22 1403 05/07/22 0211  AST 32 20  ALT 21 19  ALKPHOS 72 63  BILITOT 0.8 0.9  PROT 7.5 6.8  ALBUMIN 4.5 3.8   Recent Labs  Lab 05/06/22 1403 05/07/22 0211  LIPASE 756* 1,230*   No results for input(s): "AMMONIA" in the last 168 hours. Coagulation Profile: No results for input(s): "INR", "PROTIME" in the last 168 hours. Cardiac Enzymes: No results for input(s): "CKTOTAL", "CKMB", "CKMBINDEX", "TROPONINI" in the last 168 hours. BNP (last 3 results) No results for input(s): "PROBNP" in the last 8760 hours. HbA1C: No results for input(s): "HGBA1C" in the last 72 hours. CBG: No results for input(s): "GLUCAP" in the last 168 hours. Lipid Profile: No results for input(s): "CHOL", "HDL", "LDLCALC", "TRIG", "CHOLHDL", "LDLDIRECT" in the last 72 hours. Thyroid Function Tests: No results for input(s): "TSH", "T4TOTAL", "FREET4", "T3FREE", "THYROIDAB" in the last 72 hours. Anemia Panel: No results for input(s): "VITAMINB12", "FOLATE", "FERRITIN", "TIBC", "IRON", "RETICCTPCT" in the last 72 hours. Sepsis Labs: No results for input(s): "PROCALCITON", "LATICACIDVEN" in the last 168 hours.  No results found for this or any previous visit (from the past 240 hour(s)).    Radiology Studies: MR ABDOMEN MRCP W WO CONTAST  Result Date: 05/06/2022 CLINICAL DATA:  Biliary obstruction suspected EXAM: MRI ABDOMEN WITHOUT AND WITH CONTRAST (INCLUDING MRCP) TECHNIQUE: Multiplanar multisequence MR imaging of the abdomen was performed both before and after the administration of intravenous contrast. Heavily T2-weighted images of the biliary and pancreatic ducts were obtained, and three-dimensional MRCP images were rendered by post processing. CONTRAST:  7mL GADAVIST  GADOBUTROL 1 MMOL/ML IV SOLN COMPARISON:  Same day CT angio of the chest, and pelvis FINDINGS: Lower chest: No acute findings. Hepatobiliary: No suspicious liver lesions. Gallbladder is unremarkable. No biliary ductal dilation. No evidence of choledocholithiasis. Pancreas: Diffuse pancreatic edema with peripancreatic fat stranding and fluid. Mild diffuse prominence of the main pancreatic duct, measuring up to 4 mm. No evidence of obstructing lesion. No gland hypoenhancement or organized peripancreatic fluid collections. Spleen:  Within normal limits in size and appearance. Adrenals/Urinary Tract: Bilateral adrenal glands are unremarkable. No hydronephrosis nephrolithiasis. T2 hyperintense bilateral renal lesions evidence of enhancement, compatible with simple cysts. Stomach/Bowel: Visualized portions within the abdomen are unremarkable. Vascular/Lymphatic: Abdominal aortic aneurysm status post endovascular repair. Numerous varices of the left hemiabdomen again seen. Other:  None. Musculoskeletal: No suspicious bone lesions identified. IMPRESSION: 1. No biliary ductal dilation or evidence of choledocholithiasis. 2. Findings compatible with acute pancreatitis. No gland hypoenhancement or organized peripancreatic fluid collections. Electronically Signed   By: Allegra LaiLeah  Strickland M.D.   On: 05/06/2022 20:19   CT Angio Chest/Abd/Pel for Dissection W and/or Wo Contrast  Result Date: 05/06/2022 CLINICAL DATA:  Acute aortic syndrome (AAS) suspected EXAM: CT ANGIOGRAPHY CHEST, ABDOMEN AND PELVIS TECHNIQUE: Non-contrast CT of the chest was initially obtained. Multidetector CT imaging through the chest, abdomen and pelvis was performed using the standard protocol during bolus administration of intravenous contrast.  Multiplanar reconstructed images and MIPs were obtained and reviewed to evaluate the vascular anatomy. RADIATION DOSE REDUCTION: This exam was performed according to the departmental dose-optimization program  which includes automated exposure control, adjustment of the mA and/or kV according to patient size and/or use of iterative reconstruction technique. CONTRAST:  43mL OMNIPAQUE IOHEXOL 350 MG/ML SOLN COMPARISON:  January 11, 2022. FINDINGS: CTA CHEST FINDINGS Cardiovascular: Preferential opacification of the thoracic aorta. No evidence of thoracic aortic aneurysm or dissection. Normal heart size. No pericardial effusion. Coronary artery and thoracic aorta atherosclerotic calcifications. No evidence of intramural hematoma. No central pulmonary embolism. Mildly limited assessment of the aortic root secondary to motion. Mediastinum/Nodes: Visualized thyroid is unremarkable. No axillary or mediastinal adenopathy. Lungs/Pleura: No pleural effusion or pneumothorax. Mild paraseptal emphysema. No focal consolidation or suspicious pulmonary nodule. Musculoskeletal: No chest Tyner abnormality. No acute or significant osseous findings. Review of the MIP images confirms the above findings. CTA ABDOMEN AND PELVIS FINDINGS VASCULAR Aorta: Status post aorta bi-iliac endovascular stent graft repair of the infrarenal abdominal aorta. Stent graft is patent. Excluded aneurysm sac measures approximately 5.2 x 5.1 cm, previously 6.0 x 6.0 cm. No evidence of endoleak on arterial images. Celiac: Patent. SMA: Patent with minimal atherosclerotic calcification at the origin. Renals: Single bilateral renal arteries are patent with mild atherosclerotic calcifications at the origin. No significant stenosis. IMA: Occluded at its origin.  Retrograde flow is noted. Inflow: Patent with scattered atherosclerotic calcifications. Veins: No obvious venous abnormality within the limitations of this arterial phase study. Review of the MIP images confirms the above findings. NON-VASCULAR Hepatobiliary: Hepatic steatosis. There is a radiopaque density noted in the expected region of the common bile duct confluence with the pancreatic duct versus within the  proximal pancreatic duct (series 6, image 182). Gallbladder is unremarkable. Splenorenal varices suggestive of underlying portal venous hypertension. Pancreas: There is mild prominence of the pancreatic duct to approximately 7 mm (series 6, image 175). There is haziness of the fat adjacent to the pancreas. Spleen: Normal in size. Adrenals/Urinary Tract: Adrenal glands are unremarkable. Kidneys enhance symmetrically. Nephrosis. No obstructing nephrolithiasis. Bladder is unremarkable. Stomach/Bowel: No evidence of bowel obstruction. Appendix is normal. Stomach is unremarkable. Lymphatic: No new suspicious lymphadenopathy. Reproductive: Prostate is unremarkable. Other: No free air free fluid. Musculoskeletal: No acute or significant osseous findings. Review of the MIP images confirms the above findings. IMPRESSION: 1. There is a radiopaque density noted in the expected region of the common bile duct confluence with the pancreatic duct versus within the proximal pancreatic duct. There is mild prominence of the pancreatic duct. There is haziness of the fat adjacent to the pancreas. Findings are concerning for choledocholithiasis versus pancreatic duct stone with possible pancreatitis. Recommend correlation with serum lipase. This could be further assessed with dedicated MRI liver with and without contrast with MRCP if clinically indicated. 2. Status post aorta bi-iliac endovascular stent graft repair of the infrarenal abdominal aorta. No evidence of endoleak on arterial images. 3. Hepatic steatosis with morphologic changes suggestive of underlying portal venous hypertension with splenorenal varices Emphysema (ICD10-J43.9).  Aortic Atherosclerosis (ICD10-I70.0). Electronically Signed   By: Meda Klinefelter M.D.   On: 05/06/2022 15:39   DG Chest Port 1 View  Result Date: 05/06/2022 CLINICAL DATA:  Chest pain EXAM: PORTABLE CHEST 1 VIEW COMPARISON:  12/03/2021 FINDINGS: The heart size and mediastinal contours are  within normal limits. Both lungs are clear. Few old healed fractures are seen in left ribs. IMPRESSION: No active disease. Electronically Signed  By: Ernie Avena M.D.   On: 05/06/2022 14:42    Scheduled Meds:  amLODipine  5 mg Oral Daily   aspirin  81 mg Oral Daily   atorvastatin  40 mg Oral Daily   enoxaparin (LOVENOX) injection  40 mg Subcutaneous Q24H   Continuous Infusions:  sodium chloride 150 mL/hr at 05/07/22 0324     LOS: 0 days   Time spent: 35 minutes   Naylene Foell Estill Cotta, MD [facility]  If 7PM-7AM, please contact night-coverage [email or web address] 05/07/2022, 11:18 AM

## 2022-05-08 DIAGNOSIS — K852 Alcohol induced acute pancreatitis without necrosis or infection: Secondary | ICD-10-CM | POA: Diagnosis not present

## 2022-05-08 LAB — CBC
HCT: 48.5 % (ref 39.0–52.0)
Hemoglobin: 16.2 g/dL (ref 13.0–17.0)
MCH: 29.3 pg (ref 26.0–34.0)
MCHC: 33.4 g/dL (ref 30.0–36.0)
MCV: 87.7 fL (ref 80.0–100.0)
Platelets: 182 10*3/uL (ref 150–400)
RBC: 5.53 MIL/uL (ref 4.22–5.81)
RDW: 13.5 % (ref 11.5–15.5)
WBC: 5.7 10*3/uL (ref 4.0–10.5)
nRBC: 0 % (ref 0.0–0.2)

## 2022-05-08 LAB — COMPREHENSIVE METABOLIC PANEL
ALT: 18 U/L (ref 0–44)
AST: 22 U/L (ref 15–41)
Albumin: 4.2 g/dL (ref 3.5–5.0)
Alkaline Phosphatase: 74 U/L (ref 38–126)
Anion gap: 14 (ref 5–15)
BUN: <5 mg/dL — ABNORMAL LOW (ref 6–20)
CO2: 26 mmol/L (ref 22–32)
Calcium: 9.6 mg/dL (ref 8.9–10.3)
Chloride: 95 mmol/L — ABNORMAL LOW (ref 98–111)
Creatinine, Ser: 0.8 mg/dL (ref 0.61–1.24)
GFR, Estimated: >60 mL/min (ref >60)
Glucose, Bld: 105 mg/dL — ABNORMAL HIGH (ref 70–99)
Potassium: 3.3 mmol/L — ABNORMAL LOW (ref 3.5–5.1)
Sodium: 135 mmol/L (ref 135–145)
Total Bilirubin: 1.3 mg/dL — ABNORMAL HIGH (ref 0.3–1.2)
Total Protein: 7.7 g/dL (ref 6.5–8.1)

## 2022-05-08 LAB — TRIGLYCERIDES: Triglycerides: 72 mg/dL (ref <150)

## 2022-05-08 LAB — HIV ANTIBODY (ROUTINE TESTING W REFLEX): HIV Screen 4th Generation wRfx: NONREACTIVE

## 2022-05-08 LAB — MAGNESIUM: Magnesium: 1.7 mg/dL (ref 1.7–2.4)

## 2022-05-08 MED ORDER — KCL IN DEXTROSE-NACL 20-5-0.45 MEQ/L-%-% IV SOLN
INTRAVENOUS | Status: DC
  Filled 2022-05-08 (×3): qty 1000

## 2022-05-08 MED ORDER — MAGNESIUM SULFATE 2 GM/50ML IV SOLN
2.0000 g | Freq: Once | INTRAVENOUS | Status: AC
  Administered 2022-05-08: 2 g via INTRAVENOUS
  Filled 2022-05-08: qty 50

## 2022-05-08 MED ORDER — POTASSIUM CHLORIDE CRYS ER 20 MEQ PO TBCR
60.0000 meq | EXTENDED_RELEASE_TABLET | Freq: Once | ORAL | Status: DC
  Filled 2022-05-08: qty 3

## 2022-05-08 MED ORDER — AMLODIPINE BESYLATE 5 MG PO TABS
5.0000 mg | ORAL_TABLET | Freq: Once | ORAL | Status: AC
  Administered 2022-05-08: 5 mg via ORAL

## 2022-05-08 MED ORDER — AMOXICILLIN-POT CLAVULANATE 875-125 MG PO TABS
1.0000 | ORAL_TABLET | Freq: Two times a day (BID) | ORAL | Status: DC
  Administered 2022-05-08 – 2022-05-09 (×3): 1 via ORAL
  Filled 2022-05-08 (×3): qty 1

## 2022-05-08 MED ORDER — AMLODIPINE BESYLATE 10 MG PO TABS
10.0000 mg | ORAL_TABLET | Freq: Every day | ORAL | Status: DC
  Administered 2022-05-09: 10 mg via ORAL
  Filled 2022-05-08: qty 1

## 2022-05-08 MED ORDER — LABETALOL HCL 100 MG PO TABS
100.0000 mg | ORAL_TABLET | Freq: Two times a day (BID) | ORAL | Status: DC
  Administered 2022-05-08 – 2022-05-09 (×3): 100 mg via ORAL
  Filled 2022-05-08 (×3): qty 1

## 2022-05-08 NOTE — Progress Notes (Signed)
PROGRESS NOTE    Angel Golden  GYF:[id number] DOB: 09-27-1960 DOA: 05/06/2022 PCP: [facility]   Brief Narrative:  Angel Golden is a 61 y.o. male with medical history significant of AAA s/p endovascular repair (12/2021), HTN, PAD, Cirrhosis, polysubstance abuse (ETOH, cocaine, THC) , PTSD, MDD, HLD, bipolar disorder who presents with epigastric pain, nausea and vomiting.   In the ED, he initially presented with elevated BP of 200/130. No leukocytosis, hemoglobin appears hemoconcentrated at 16.7 up from a prior of 12.4.CMP revealing for lipase of 756 but had normal LFTs and total bilirubin.CTA chest/abdomen/pelvis obtained due to his recent AAA repair.  It was revealing for possible choledocholithiasis/pancreatic duct stone with possible pancreatitis.  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 Patient has been given IV morphine, IV fluid and antiemetics in the ED.  ED PA discussed with [facility] who recommended MRCP-that resulted negative for choledocholithiasis.  Patient admitted for further evaluation and management for pancreatitis.  Assessment & Plan:   Acute pancreatitis: -In the setting of alcoholic use.  Lipase elevated. -MRCP negative for choledocholithiasis revealing only pancreatitis. - is currently off fluids, will re-start those -patient not tolerating soft diet, will step back to clears -Liver enzymes are within normal limits - will check triglyceride level  Alcohol abuse: -with signs of withdrawal this morning, received ativan, will continue ativan ciwa protocol  PAD: Continue aspirin and statins.  Hypertension: Elevated upon arrival likely due to pain and noncompliance. -Continue amlodipine but will increase to 20, hydralazine as needed. - resume home labetalol  Infrarenal abdominal aortic aneurysm (AAA) without rupture [facility]) S/p endovascular repair on 12/2021. CTA A/P today shows stable  graft stent.   Tobacco abuse: Recommend cessation  Polysubstance abuse: History of marijuana and cocaine abuse.  Recommend cessation. Check hiv, hcv, rpr, hbv  Toothache - start augmentin  DVT prophylaxis: Lovenox Code Status: Full code Family Communication:  None present at bedside.    Disposition Plan: Home  Consultants:  none  Procedures:  None  Antimicrobials:  None  Status is: inpg   Subjective: Patient seen and examined.  Resting comfortably on the bed.  Reports main pain is right upper tooth, ongoing epigastric abd pain  Objective: Vitals:   05/07/22 2049 05/08/22 0519 05/08/22 0817 05/08/22 0925  BP: (!) 188/121 (!) 190/117 (!) 183/110 (!) 176/92  Pulse: 91 92 (!) 108 (!) 108  Resp: 20 20 20 16   Temp: 98.9 F (37.2 C) 98.6 F (37 C) 98.8 F (37.1 C) 98.4 F (36.9 C)  TempSrc: Oral Oral Oral Oral  SpO2: 100% 100% 100% 100%    Intake/Output Summary (Last 24 hours) at 05/08/2022 1023 Last data filed at 05/07/2022 2030 Gross per 24 hour  Intake 120 ml  Output --  Net 120 ml   There were no vitals filed for this visit.  Examination:  General exam: Appears calm and comfortable, on room air, appears weak, sick and dehydrated Respiratory system: Clear to auscultation. Respiratory effort normal. Cardiovascular system: S1 & S2 heard, RRR. No JVD, murmurs, rubs, gallops or clicks. No pedal edema. Mouth: poor dentition, no abscess seen Gastrointestinal system: Abdomen soft, epigastric tenderness positive, no guarding, no rigidity, bowel sounds positive  Central nervous system: Alert and oriented. No focal neurological deficits. Extremities: Symmetric 5 x 5 power. Skin: No rashes, lesions or ulcers Psychiatry: Judgement and insight appear normal. Mood & affect appropriate.    Data Reviewed: I have personally reviewed following  labs and imaging studies  CBC: Recent Labs  Lab 05/06/22 1403 05/06/22 1440 05/07/22 0211 05/08/22 0258  WBC 6.0  --  6.7  5.7  NEUTROABS 3.6  --   --   --   HGB 16.2 16.7 15.0 16.2  HCT 47.2 49.0 45.6 48.5  MCV 88.1  --  90.3 87.7  PLT 219  --  207 182   Basic Metabolic Panel: Recent Labs  Lab 05/06/22 1403 05/06/22 1440 05/07/22 0211 05/08/22 0258  NA 137 141 137 135  K 3.9 3.8 3.9 3.3*  CL 107 106 103 95*  CO2 18*  --  28 26  GLUCOSE 116* 119* 104* 105*  BUN 12 12 8  <5*  CREATININE 0.92 0.80 0.96 0.80  CALCIUM 9.8  --  9.2 9.6   GFR: CrCl cannot be calculated (Unknown ideal weight.). Liver Function Tests: Recent Labs  Lab 05/06/22 1403 05/07/22 0211 05/08/22 0258  AST 32 20 22  ALT 21 19 18   ALKPHOS 72 63 74  BILITOT 0.8 0.9 1.3*  PROT 7.5 6.8 7.7  ALBUMIN 4.5 3.8 4.2   Recent Labs  Lab 05/06/22 1403 05/07/22 0211  LIPASE 756* 1,230*   No results for input(s): "AMMONIA" in the last 168 hours. Coagulation Profile: No results for input(s): "INR", "PROTIME" in the last 168 hours. Cardiac Enzymes: No results for input(s): "CKTOTAL", "CKMB", "CKMBINDEX", "TROPONINI" in the last 168 hours. BNP (last 3 results) No results for input(s): "PROBNP" in the last 8760 hours. HbA1C: No results for input(s): "HGBA1C" in the last 72 hours. CBG: No results for input(s): "GLUCAP" in the last 168 hours. Lipid Profile: No results for input(s): "CHOL", "HDL", "LDLCALC", "TRIG", "CHOLHDL", "LDLDIRECT" in the last 72 hours. Thyroid Function Tests: No results for input(s): "TSH", "T4TOTAL", "FREET4", "T3FREE", "THYROIDAB" in the last 72 hours. Anemia Panel: No results for input(s): "VITAMINB12", "FOLATE", "FERRITIN", "TIBC", "IRON", "RETICCTPCT" in the last 72 hours. Sepsis Labs: No results for input(s): "PROCALCITON", "LATICACIDVEN" in the last 168 hours.  No results found for this or any previous visit (from the past 240 hour(s)).    Radiology Studies: MR ABDOMEN MRCP W WO CONTAST  Result Date: 05/06/2022 CLINICAL DATA:  Biliary obstruction suspected EXAM: MRI ABDOMEN WITHOUT AND WITH  CONTRAST (INCLUDING MRCP) TECHNIQUE: Multiplanar multisequence MR imaging of the abdomen was performed both before and after the administration of intravenous contrast. Heavily T2-weighted images of the biliary and pancreatic ducts were obtained, and three-dimensional MRCP images were rendered by post processing. CONTRAST:  7mL GADAVIST GADOBUTROL 1 MMOL/ML IV SOLN COMPARISON:  Same day CT angio of the chest, and pelvis FINDINGS: Lower chest: No acute findings. Hepatobiliary: No suspicious liver lesions. Gallbladder is unremarkable. No biliary ductal dilation. No evidence of choledocholithiasis. Pancreas: Diffuse pancreatic edema with peripancreatic fat stranding and fluid. Mild diffuse prominence of the main pancreatic duct, measuring up to 4 mm. No evidence of obstructing lesion. No gland hypoenhancement or organized peripancreatic fluid collections. Spleen:  Within normal limits in size and appearance. Adrenals/Urinary Tract: Bilateral adrenal glands are unremarkable. No hydronephrosis nephrolithiasis. T2 hyperintense bilateral renal lesions evidence of enhancement, compatible with simple cysts. Stomach/Bowel: Visualized portions within the abdomen are unremarkable. Vascular/Lymphatic: Abdominal aortic aneurysm status post endovascular repair. Numerous varices of the left hemiabdomen again seen. Other:  None. Musculoskeletal: No suspicious bone lesions identified. IMPRESSION: 1. No biliary ductal dilation or evidence of choledocholithiasis. 2. Findings compatible with acute pancreatitis. No gland hypoenhancement or organized peripancreatic fluid collections. Electronically Signed   By: Tacey RuizLeah  Strickland M.D.   On: 05/06/2022 20:19   CT Angio Chest/Abd/Pel for Dissection W and/or Wo Contrast  Result Date: 05/06/2022 CLINICAL DATA:  Acute aortic syndrome (AAS) suspected EXAM: CT ANGIOGRAPHY CHEST, ABDOMEN AND PELVIS TECHNIQUE: Non-contrast CT of the chest was initially obtained. Multidetector CT imaging through  the chest, abdomen and pelvis was performed using the standard protocol during bolus administration of intravenous contrast. Multiplanar reconstructed images and MIPs were obtained and reviewed to evaluate the vascular anatomy. RADIATION DOSE REDUCTION: This exam was performed according to the departmental dose-optimization program which includes automated exposure control, adjustment of the mA and/or kV according to patient size and/or use of iterative reconstruction technique. CONTRAST:  86mL OMNIPAQUE IOHEXOL 350 MG/ML SOLN COMPARISON:  January 11, 2022. FINDINGS: CTA CHEST FINDINGS Cardiovascular: Preferential opacification of the thoracic aorta. No evidence of thoracic aortic aneurysm or dissection. Normal heart size. No pericardial effusion. Coronary artery and thoracic aorta atherosclerotic calcifications. No evidence of intramural hematoma. No central pulmonary embolism. Mildly limited assessment of the aortic root secondary to motion. Mediastinum/Nodes: Visualized thyroid is unremarkable. No axillary or mediastinal adenopathy. Lungs/Pleura: No pleural effusion or pneumothorax. Mild paraseptal emphysema. No focal consolidation or suspicious pulmonary nodule. Musculoskeletal: No chest [place] abnormality. No acute or significant osseous findings. Review of the MIP images confirms the above findings. CTA ABDOMEN AND PELVIS FINDINGS VASCULAR Aorta: Status post aorta bi-iliac endovascular stent graft repair of the infrarenal abdominal aorta. Stent graft is patent. Excluded aneurysm sac measures approximately 5.2 x 5.1 cm, previously 6.0 x 6.0 cm. No evidence of endoleak on arterial images. Celiac: Patent. SMA: Patent with minimal atherosclerotic calcification at the origin. Renals: Single bilateral renal arteries are patent with mild atherosclerotic calcifications at the origin. No significant stenosis. IMA: Occluded at its origin.  Retrograde flow is noted. Inflow: Patent with scattered atherosclerotic  calcifications. Veins: No obvious venous abnormality within the limitations of this arterial phase study. Review of the MIP images confirms the above findings. NON-VASCULAR Hepatobiliary: Hepatic steatosis. There is a radiopaque density noted in the expected region of the common bile duct confluence with the pancreatic duct versus within the proximal pancreatic duct (series 6, image 182). Gallbladder is unremarkable. Splenorenal varices suggestive of underlying portal venous hypertension. Pancreas: There is mild prominence of the pancreatic duct to approximately 7 mm (series 6, image 175). There is haziness of the fat adjacent to the pancreas. Spleen: Normal in size. Adrenals/Urinary Tract: Adrenal glands are unremarkable. Kidneys enhance symmetrically. Nephrosis. No obstructing nephrolithiasis. Bladder is unremarkable. Stomach/Bowel: No evidence of bowel obstruction. Appendix is normal. Stomach is unremarkable. Lymphatic: No new suspicious lymphadenopathy. Reproductive: Prostate is unremarkable. Other: No free air free fluid. Musculoskeletal: No acute or significant osseous findings. Review of the MIP images confirms the above findings. IMPRESSION: 1. There is a radiopaque density noted in the expected region of the common bile duct confluence with the pancreatic duct versus within the proximal pancreatic duct. There is mild prominence of the pancreatic duct. There is haziness of the fat adjacent to the pancreas. Findings are concerning for choledocholithiasis versus pancreatic duct stone with possible pancreatitis. Recommend correlation with serum lipase. This could be further assessed with dedicated MRI liver with and without contrast with MRCP if clinically indicated. 2. Status post aorta bi-iliac endovascular stent graft repair of the infrarenal abdominal aorta. No evidence of endoleak on arterial images. 3. Hepatic steatosis with morphologic changes suggestive of underlying portal venous hypertension with  splenorenal varices Emphysema (ICD10-J43.9).  Aortic Atherosclerosis (ICD10-I70.0). Electronically Signed  By: Meda Klinefelter M.D.   On: 05/06/2022 15:39   DG Chest Port 1 View  Result Date: 05/06/2022 CLINICAL DATA:  Chest pain EXAM: PORTABLE CHEST 1 VIEW COMPARISON:  12/03/2021 FINDINGS: The heart size and mediastinal contours are within normal limits. Both lungs are clear. Few old healed fractures are seen in left ribs. IMPRESSION: No active disease. Electronically Signed   By: Ernie Avena M.D.   On: 05/06/2022 14:42    Scheduled Meds:  amLODipine  5 mg Oral Daily   aspirin  81 mg Oral Daily   atorvastatin  40 mg Oral Daily   enoxaparin (LOVENOX) injection  40 mg Subcutaneous Q24H   potassium chloride  60 mEq Oral Once   Continuous Infusions:     LOS: 1 day   Time spent: 35 minutes   Silvano Bilis, MD [facility]  If 7PM-7AM, please contact night-coverage [email or web address] 05/08/2022, 10:23 AM

## 2022-05-09 DIAGNOSIS — K859 Acute pancreatitis without necrosis or infection, unspecified: Secondary | ICD-10-CM

## 2022-05-09 LAB — BASIC METABOLIC PANEL
Anion gap: 9 (ref 5–15)
BUN: 6 mg/dL (ref 6–20)
CO2: 24 mmol/L (ref 22–32)
Calcium: 8.9 mg/dL (ref 8.9–10.3)
Chloride: 101 mmol/L (ref 98–111)
Creatinine, Ser: 0.87 mg/dL (ref 0.61–1.24)
GFR, Estimated: >60 mL/min (ref >60)
Glucose, Bld: 121 mg/dL — ABNORMAL HIGH (ref 70–99)
Potassium: 3.1 mmol/L — ABNORMAL LOW (ref 3.5–5.1)
Sodium: 134 mmol/L — ABNORMAL LOW (ref 135–145)

## 2022-05-09 LAB — HIV ANTIBODY (ROUTINE TESTING W REFLEX): HIV Screen 4th Generation wRfx: NONREACTIVE

## 2022-05-09 LAB — HEPATITIS B SURFACE ANTIGEN: Hepatitis B Surface Ag: NONREACTIVE

## 2022-05-09 LAB — RPR: RPR Ser Ql: NONREACTIVE

## 2022-05-09 MED ORDER — PANTOPRAZOLE SODIUM 40 MG PO TBEC: 40.0000 mg | DELAYED_RELEASE_TABLET | Freq: Every day | ORAL | 1 refills | Status: AC

## 2022-05-09 MED ORDER — POTASSIUM CHLORIDE CRYS ER 20 MEQ PO TBCR
40.0000 meq | EXTENDED_RELEASE_TABLET | Freq: Once | ORAL | Status: AC
  Administered 2022-05-09: 40 meq via ORAL
  Filled 2022-05-09: qty 2

## 2022-05-09 MED ORDER — PANTOPRAZOLE SODIUM 40 MG IV SOLR
40.0000 mg | Freq: Two times a day (BID) | INTRAVENOUS | Status: DC
  Administered 2022-05-09: 40 mg via INTRAVENOUS
  Filled 2022-05-09: qty 10

## 2022-05-09 MED ORDER — AMOXICILLIN-POT CLAVULANATE 875-125 MG PO TABS: 1.0000 | ORAL_TABLET | Freq: Two times a day (BID) | ORAL | 0 refills | Status: AC

## 2022-05-09 MED ORDER — AMLODIPINE BESYLATE 10 MG PO TABS: 10.0000 mg | ORAL_TABLET | Freq: Every day | ORAL | 0 refills | Status: AC

## 2022-05-09 NOTE — Social Work (Signed)
CSW acknowledges consult for ETOH resources and counseling. CSW advised pt is discharging with sister and will be going to the [place] for substance use treatment. CSW to defer to next venue for appropriate interventions. TOC will sign off.

## 2022-05-09 NOTE — Discharge Summary (Signed)
Physician Discharge Summary   Patient: Angel Golden MRN: [id number] DOB: 1960-12-21  Admit date:     05/06/2022  Discharge date: 05/09/22  Discharge Physician: Alba CoryBelkys A Katria Botts   [facility]   Recommendations at discharge:    Follow up resolution of pancreatitis.  Follow up BP management.   Discharge Diagnoses: Principal Problem:   Pancreatitis Active Problems:   Infrarenal abdominal aortic aneurysm (AAA) without rupture (HCC)   Hypertension   PAD (peripheral artery disease) (HCC)   Alcohol abuse   Acute pancreatitis  Resolved Problems:   * No resolved hospital problems. *  Hospital Course: Angel Golden is a 61 y.o. male with medical history significant of AAA s/p endovascular repair (12/2021), HTN, PAD, Cirrhosis, polysubstance abuse (ETOH, cocaine, THC) , PTSD, MDD, HLD, bipolar disorder who presents with epigastric pain, nausea and vomiting.    In the ED, he initially presented with elevated BP of 200/130. No leukocytosis, hemoglobin appears hemoconcentrated at 16.7 up from a prior of 12.4.CMP revealing for lipase of 756 but had normal LFTs and total bilirubin.CTA chest/abdomen/pelvis obtained due to his recent AAA repair.  It was revealing for possible choledocholithiasis/pancreatic duct stone with possible pancreatitis.  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 Patient has been given IV morphine, IV fluid and antiemetics in the ED.  ED PA discussed with [facility] who recommended MRCP-that resulted negative for choledocholithiasis.  Patient admitted for further evaluation and management for pancreatitis. His symptoms improved with support care. Plan for discharge today. He will go to inpatient alcohol rehab.   Assessment and Plan: Acute pancreatitis: -In the setting of alcoholic use.  Lipase elevated. -MRCP negative for choledocholithiasis revealing only pancreatitis. -Liver enzymes are  within normal limits - Triglycerides 72. -He was treated with IV fluids, NPO status. Subsequently diet was advanced. Plan to discharge today.    Alcohol abuse: -Treated with Ciwa protocol.    PAD: Continue aspirin and statins.   Hypertension: Elevated upon arrival likely due to pain and noncompliance. -Continue amlodipine but will increase to 10, hydralazine as needed. -Continue with labetalol   Infrarenal abdominal aortic aneurysm (AAA) without rupture [name](HCC) S/p endovascular repair on 12/2021. CTA A/P today shows stable graft stent.   Tobacco abuse: Recommend cessation   Polysubstance abuse: History of marijuana and cocaine abuse.  Recommend cessation. Check hiv, hcv, rpr, hbv   Toothache - started Augmentin, complete 5 days.          Consultants: None Procedures performed: None Disposition: Home Diet recommendation:  Cardiac diet DISCHARGE MEDICATION: Allergies as of 05/09/2022   No Known Allergies      Medication List     STOP taking these medications    naltrexone 50 MG tablet Commonly known as: DEPADE       TAKE these medications    amLODipine 10 MG tablet Commonly known as: NORVASC Take 1 tablet (10 mg total) by mouth daily. Start taking on: May 10, 2022 What changed:  medication strength how much to take   amoxicillin-clavulanate 875-125 MG tablet Commonly known as: AUGMENTIN Take 1 tablet by mouth every 12 (twelve) hours for 5 days.   aspirin EC 81 MG tablet Take 1 tablet (81 mg total) by mouth daily. Swallow whole.   atorvastatin 40 MG tablet Commonly known as: LIPITOR Take 1 tablet (40 mg total) by mouth daily.   B-12 PO Take 1 tablet by mouth daily.   escitalopram 10 MG tablet  Commonly known as: LEXAPRO Take 10 mg by mouth daily.   hydrocortisone 2.5 % rectal cream Commonly known as: ANUSOL-HC Place 1 application  rectally 2 (two) times daily. Patient would like his medicines mailed to him What changed:  when to take  this reasons to take this   ibuprofen 600 MG tablet Commonly known as: ADVIL Take 600 mg by mouth 3 (three) times daily as needed (pain.).   labetalol 100 MG tablet Commonly known as: NORMODYNE Take 1 tablet (100 mg total) by mouth 2 (two) times daily.   pantoprazole 40 MG tablet Commonly known as: Protonix Take 1 tablet (40 mg total) by mouth daily.   traZODone 100 MG tablet Commonly known as: DESYREL Take 1 tablet (100 mg total) by mouth at bedtime as needed for sleep.   triamcinolone 0.025 % cream Commonly known as: KENALOG Apply 1 Application topically 2 (two) times daily as needed (rash).   witch hazel-glycerin pad Commonly known as: TUCKS Apply 1 Application topically 4 (four) times daily as needed (irritaiton after bowel movement).        Discharge Exam: There were no vitals filed for this visit. General; NAD  Condition at discharge: stable  The results of significant diagnostics from this hospitalization (including imaging, microbiology, ancillary and laboratory) are listed below for reference.   Imaging Studies: MR ABDOMEN MRCP W WO CONTAST  Result Date: 05/06/2022 CLINICAL DATA:  Biliary obstruction suspected EXAM: MRI ABDOMEN WITHOUT AND WITH CONTRAST (INCLUDING MRCP) TECHNIQUE: Multiplanar multisequence MR imaging of the abdomen was performed both before and after the administration of intravenous contrast. Heavily T2-weighted images of the biliary and pancreatic ducts were obtained, and three-dimensional MRCP images were rendered by post processing. CONTRAST:  30mL GADAVIST GADOBUTROL 1 MMOL/ML IV SOLN COMPARISON:  Same day CT angio of the chest, and pelvis FINDINGS: Lower chest: No acute findings. Hepatobiliary: No suspicious liver lesions. Gallbladder is unremarkable. No biliary ductal dilation. No evidence of choledocholithiasis. Pancreas: Diffuse pancreatic edema with peripancreatic fat stranding and fluid. Mild diffuse prominence of the main pancreatic  duct, measuring up to 4 mm. No evidence of obstructing lesion. No gland hypoenhancement or organized peripancreatic fluid collections. Spleen:  Within normal limits in size and appearance. Adrenals/Urinary Tract: Bilateral adrenal glands are unremarkable. No hydronephrosis nephrolithiasis. T2 hyperintense bilateral renal lesions evidence of enhancement, compatible with simple cysts. Stomach/Bowel: Visualized portions within the abdomen are unremarkable. Vascular/Lymphatic: Abdominal aortic aneurysm status post endovascular repair. Numerous varices of the left hemiabdomen again seen. Other:  None. Musculoskeletal: No suspicious bone lesions identified. IMPRESSION: 1. No biliary ductal dilation or evidence of choledocholithiasis. 2. Findings compatible with acute pancreatitis. No gland hypoenhancement or organized peripancreatic fluid collections. Electronically Signed   By: Allegra Lai M.D.   On: 05/06/2022 20:19   CT Angio Chest/Abd/Pel for Dissection W and/or Wo Contrast  Result Date: 05/06/2022 CLINICAL DATA:  Acute aortic syndrome (AAS) suspected EXAM: CT ANGIOGRAPHY CHEST, ABDOMEN AND PELVIS TECHNIQUE: Non-contrast CT of the chest was initially obtained. Multidetector CT imaging through the chest, abdomen and pelvis was performed using the standard protocol during bolus administration of intravenous contrast. Multiplanar reconstructed images and MIPs were obtained and reviewed to evaluate the vascular anatomy. RADIATION DOSE REDUCTION: This exam was performed according to the departmental dose-optimization program which includes automated exposure control, adjustment of the mA and/or kV according to patient size and/or use of iterative reconstruction technique. CONTRAST:  14mL OMNIPAQUE IOHEXOL 350 MG/ML SOLN COMPARISON:  January 11, 2022. FINDINGS: CTA CHEST FINDINGS Cardiovascular:  Preferential opacification of the thoracic aorta. No evidence of thoracic aortic aneurysm or dissection. Normal heart  size. No pericardial effusion. Coronary artery and thoracic aorta atherosclerotic calcifications. No evidence of intramural hematoma. No central pulmonary embolism. Mildly limited assessment of the aortic root secondary to motion. Mediastinum/Nodes: Visualized thyroid is unremarkable. No axillary or mediastinal adenopathy. Lungs/Pleura: No pleural effusion or pneumothorax. Mild paraseptal emphysema. No focal consolidation or suspicious pulmonary nodule. Musculoskeletal: No chest Bohnenkamp abnormality. No acute or significant osseous findings. Review of the MIP images confirms the above findings. CTA ABDOMEN AND PELVIS FINDINGS VASCULAR Aorta: Status post aorta bi-iliac endovascular stent graft repair of the infrarenal abdominal aorta. Stent graft is patent. Excluded aneurysm sac measures approximately 5.2 x 5.1 cm, previously 6.0 x 6.0 cm. No evidence of endoleak on arterial images. Celiac: Patent. SMA: Patent with minimal atherosclerotic calcification at the origin. Renals: Single bilateral renal arteries are patent with mild atherosclerotic calcifications at the origin. No significant stenosis. IMA: Occluded at its origin.  Retrograde flow is noted. Inflow: Patent with scattered atherosclerotic calcifications. Veins: No obvious venous abnormality within the limitations of this arterial phase study. Review of the MIP images confirms the above findings. NON-VASCULAR Hepatobiliary: Hepatic steatosis. There is a radiopaque density noted in the expected region of the common bile duct confluence with the pancreatic duct versus within the proximal pancreatic duct (series 6, image 182). Gallbladder is unremarkable. Splenorenal varices suggestive of underlying portal venous hypertension. Pancreas: There is mild prominence of the pancreatic duct to approximately 7 mm (series 6, image 175). There is haziness of the fat adjacent to the pancreas. Spleen: Normal in size. Adrenals/Urinary Tract: Adrenal glands are unremarkable.  Kidneys enhance symmetrically. Nephrosis. No obstructing nephrolithiasis. Bladder is unremarkable. Stomach/Bowel: No evidence of bowel obstruction. Appendix is normal. Stomach is unremarkable. Lymphatic: No new suspicious lymphadenopathy. Reproductive: Prostate is unremarkable. Other: No free air free fluid. Musculoskeletal: No acute or significant osseous findings. Review of the MIP images confirms the above findings. IMPRESSION: 1. There is a radiopaque density noted in the expected region of the common bile duct confluence with the pancreatic duct versus within the proximal pancreatic duct. There is mild prominence of the pancreatic duct. There is haziness of the fat adjacent to the pancreas. Findings are concerning for choledocholithiasis versus pancreatic duct stone with possible pancreatitis. Recommend correlation with serum lipase. This could be further assessed with dedicated MRI liver with and without contrast with MRCP if clinically indicated. 2. Status post aorta bi-iliac endovascular stent graft repair of the infrarenal abdominal aorta. No evidence of endoleak on arterial images. 3. Hepatic steatosis with morphologic changes suggestive of underlying portal venous hypertension with splenorenal varices Emphysema (ICD10-J43.9).  Aortic Atherosclerosis (ICD10-I70.0). Electronically Signed   By: Meda Klinefelter M.D.   On: 05/06/2022 15:39   DG Chest Port 1 View  Result Date: 05/06/2022 CLINICAL DATA:  Chest pain EXAM: PORTABLE CHEST 1 VIEW COMPARISON:  12/03/2021 FINDINGS: The heart size and mediastinal contours are within normal limits. Both lungs are clear. Few old healed fractures are seen in left ribs. IMPRESSION: No active disease. Electronically Signed   By: Ernie Avena M.D.   On: 05/06/2022 14:42    Microbiology: Results for orders placed or performed during the hospital encounter of 12/28/21  Surgical pcr screen     Status: None   Collection Time: 12/28/21  6:34 AM   Specimen:  Nasal Mucosa; Nasal Swab  Result Value Ref Range Status   MRSA, PCR NEGATIVE NEGATIVE Final  Staphylococcus aureus NEGATIVE NEGATIVE Final    Comment: (NOTE) The Xpert SA Assay (FDA approved for NASAL specimens in patients 13 years of age and older), is one component of a comprehensive surveillance program. It is not intended to diagnose infection nor to guide or monitor treatment. Performed at [facility], [address], [place], [place] [address]     Labs: CBC: Recent Labs  Lab 05/06/22 1403 05/06/22 1440 05/07/22 0211 05/08/22 0258  WBC 6.0  --  6.7 5.7  NEUTROABS 3.6  --   --   --   HGB 16.2 16.7 15.0 16.2  HCT 47.2 49.0 45.6 48.5  MCV 88.1  --  90.3 87.7  PLT 219  --  207 182   Basic Metabolic Panel: Recent Labs  Lab 05/06/22 1403 05/06/22 1440 05/07/22 0211 05/08/22 0258 05/08/22 1208 05/09/22 0336  NA 137 141 137 135  --  134*  K 3.9 3.8 3.9 3.3*  --  3.1*  CL 107 106 103 95*  --  101  CO2 18*  --  28 26  --  24  GLUCOSE 116* 119* 104* 105*  --  121*  BUN 12 12 8  <5*  --  6  CREATININE 0.92 0.80 0.96 0.80  --  0.87  CALCIUM 9.8  --  9.2 9.6  --  8.9  MG  --   --   --   --  1.7  --    Liver Function Tests: Recent Labs  Lab 05/06/22 1403 05/07/22 0211 05/08/22 0258  AST 32 20 22  ALT 21 19 18   ALKPHOS 72 63 74  BILITOT 0.8 0.9 1.3*  PROT 7.5 6.8 7.7  ALBUMIN 4.5 3.8 4.2   CBG: No results for input(s): "GLUCAP" in the last 168 hours.  Discharge time spent: greater than 30 minutes.  Signed: 05/10/22, MD [facility] 05/09/2022

## 2022-05-10 LAB — HCV RT-PCR, QUANT (NON-GRAPH): Hepatitis C Quantitation: NOT DETECTED IU/mL

## 2022-05-10 LAB — HCV AB W REFLEX TO QUANT PCR: HCV Ab: REACTIVE — AB

## 2022-06-24 ENCOUNTER — Inpatient Hospital Stay (HOSPITAL_COMMUNITY)
Admission: EM | Admit: 2022-06-24 | Discharge: 2022-06-27 | DRG: 439 | Disposition: A | Discharge status: Home / Self Care | Payer: 59 | Attending: Internal Medicine | Admitting: Internal Medicine

## 2022-06-24 ENCOUNTER — Other Ambulatory Visit: Payer: Self-pay

## 2022-06-24 ENCOUNTER — Emergency Department (HOSPITAL_COMMUNITY): Payer: 59

## 2022-06-24 DIAGNOSIS — I10 Essential (primary) hypertension: Secondary | ICD-10-CM | POA: Diagnosis present

## 2022-06-24 DIAGNOSIS — T448X6A Underdosing of centrally-acting and adrenergic-neuron-blocking agents, initial encounter: Secondary | ICD-10-CM | POA: Diagnosis present

## 2022-06-24 DIAGNOSIS — Z743 Need for continuous supervision: Secondary | ICD-10-CM | POA: Diagnosis not present

## 2022-06-24 DIAGNOSIS — K8689 Other specified diseases of pancreas: Secondary | ICD-10-CM | POA: Diagnosis not present

## 2022-06-24 DIAGNOSIS — I2510 Atherosclerotic heart disease of native coronary artery without angina pectoris: Secondary | ICD-10-CM | POA: Diagnosis not present

## 2022-06-24 DIAGNOSIS — I712 Thoracic aortic aneurysm, without rupture, unspecified: Secondary | ICD-10-CM | POA: Diagnosis not present

## 2022-06-24 DIAGNOSIS — F1220 Cannabis dependence, uncomplicated: Secondary | ICD-10-CM | POA: Diagnosis present

## 2022-06-24 DIAGNOSIS — I7121 Aneurysm of the ascending aorta, without rupture: Secondary | ICD-10-CM | POA: Diagnosis not present

## 2022-06-24 DIAGNOSIS — F1920 Other psychoactive substance dependence, uncomplicated: Secondary | ICD-10-CM | POA: Diagnosis present

## 2022-06-24 DIAGNOSIS — I359 Nonrheumatic aortic valve disorder, unspecified: Secondary | ICD-10-CM | POA: Diagnosis not present

## 2022-06-24 DIAGNOSIS — T461X6A Underdosing of calcium-channel blockers, initial encounter: Secondary | ICD-10-CM | POA: Diagnosis present

## 2022-06-24 DIAGNOSIS — F4310 Post-traumatic stress disorder, unspecified: Secondary | ICD-10-CM | POA: Diagnosis present

## 2022-06-24 DIAGNOSIS — F1010 Alcohol abuse, uncomplicated: Secondary | ICD-10-CM | POA: Diagnosis present

## 2022-06-24 DIAGNOSIS — I7143 Infrarenal abdominal aortic aneurysm, without rupture: Secondary | ICD-10-CM | POA: Diagnosis present

## 2022-06-24 DIAGNOSIS — K859 Acute pancreatitis without necrosis or infection, unspecified: Secondary | ICD-10-CM | POA: Diagnosis present

## 2022-06-24 DIAGNOSIS — R69 Illness, unspecified: Secondary | ICD-10-CM | POA: Diagnosis not present

## 2022-06-24 DIAGNOSIS — Z955 Presence of coronary angioplasty implant and graft: Secondary | ICD-10-CM

## 2022-06-24 DIAGNOSIS — Z9185 Personal history of military service: Secondary | ICD-10-CM

## 2022-06-24 DIAGNOSIS — Z95828 Presence of other vascular implants and grafts: Secondary | ICD-10-CM

## 2022-06-24 DIAGNOSIS — F319 Bipolar disorder, unspecified: Secondary | ICD-10-CM | POA: Diagnosis present

## 2022-06-24 DIAGNOSIS — K8050 Calculus of bile duct without cholangitis or cholecystitis without obstruction: Secondary | ICD-10-CM | POA: Diagnosis not present

## 2022-06-24 DIAGNOSIS — E785 Hyperlipidemia, unspecified: Secondary | ICD-10-CM | POA: Diagnosis present

## 2022-06-24 DIAGNOSIS — Z823 Family history of stroke: Secondary | ICD-10-CM

## 2022-06-24 DIAGNOSIS — F17210 Nicotine dependence, cigarettes, uncomplicated: Secondary | ICD-10-CM | POA: Diagnosis present

## 2022-06-24 DIAGNOSIS — I4510 Unspecified right bundle-branch block: Secondary | ICD-10-CM | POA: Diagnosis not present

## 2022-06-24 DIAGNOSIS — E871 Hypo-osmolality and hyponatremia: Secondary | ICD-10-CM | POA: Diagnosis not present

## 2022-06-24 DIAGNOSIS — Z8679 Personal history of other diseases of the circulatory system: Secondary | ICD-10-CM

## 2022-06-24 DIAGNOSIS — F1410 Cocaine abuse, uncomplicated: Secondary | ICD-10-CM | POA: Diagnosis present

## 2022-06-24 DIAGNOSIS — K852 Alcohol induced acute pancreatitis without necrosis or infection: Principal | ICD-10-CM | POA: Diagnosis present

## 2022-06-24 DIAGNOSIS — R079 Chest pain, unspecified: Secondary | ICD-10-CM | POA: Diagnosis not present

## 2022-06-24 DIAGNOSIS — Z79899 Other long term (current) drug therapy: Secondary | ICD-10-CM

## 2022-06-24 DIAGNOSIS — J439 Emphysema, unspecified: Secondary | ICD-10-CM | POA: Diagnosis not present

## 2022-06-24 DIAGNOSIS — R0789 Other chest pain: Secondary | ICD-10-CM | POA: Diagnosis not present

## 2022-06-24 DIAGNOSIS — Z8249 Family history of ischemic heart disease and other diseases of the circulatory system: Secondary | ICD-10-CM

## 2022-06-24 DIAGNOSIS — Z91199 Patient's noncompliance with other medical treatment and regimen due to unspecified reason: Secondary | ICD-10-CM

## 2022-06-24 DIAGNOSIS — K7460 Unspecified cirrhosis of liver: Secondary | ICD-10-CM | POA: Diagnosis present

## 2022-06-24 DIAGNOSIS — I739 Peripheral vascular disease, unspecified: Secondary | ICD-10-CM | POA: Diagnosis present

## 2022-06-24 DIAGNOSIS — Z7982 Long term (current) use of aspirin: Secondary | ICD-10-CM

## 2022-06-24 LAB — LIPASE, BLOOD: Lipase: 1366 U/L — ABNORMAL HIGH (ref 11–51)

## 2022-06-24 LAB — CBC WITH DIFFERENTIAL/PLATELET
Abs Immature Granulocytes: 0.02 10*3/uL (ref 0.00–0.07)
Basophils Absolute: 0 10*3/uL (ref 0.0–0.1)
Basophils Relative: 0 %
Eosinophils Absolute: 0.1 10*3/uL (ref 0.0–0.5)
Eosinophils Relative: 1 %
HCT: 43.8 % (ref 39.0–52.0)
Hemoglobin: 14.8 g/dL (ref 13.0–17.0)
Immature Granulocytes: 0 %
Lymphocytes Relative: 19 %
Lymphs Abs: 1.5 10*3/uL (ref 0.7–4.0)
MCH: 30.4 pg (ref 26.0–34.0)
MCHC: 33.8 g/dL (ref 30.0–36.0)
MCV: 89.9 fL (ref 80.0–100.0)
Monocytes Absolute: 0.4 10*3/uL (ref 0.1–1.0)
Monocytes Relative: 5 %
Neutro Abs: 5.8 10*3/uL (ref 1.7–7.7)
Neutrophils Relative %: 75 %
Platelets: 237 10*3/uL (ref 150–400)
RBC: 4.87 MIL/uL (ref 4.22–5.81)
RDW: 13.8 % (ref 11.5–15.5)
WBC: 7.8 10*3/uL (ref 4.0–10.5)
nRBC: 0 % (ref 0.0–0.2)

## 2022-06-24 LAB — COMPREHENSIVE METABOLIC PANEL
ALT: 15 U/L (ref 0–44)
AST: 28 U/L (ref 15–41)
Albumin: 4.3 g/dL (ref 3.5–5.0)
Alkaline Phosphatase: 84 U/L (ref 38–126)
Anion gap: 15 (ref 5–15)
BUN: 5 mg/dL — ABNORMAL LOW (ref 6–20)
CO2: 21 mmol/L — ABNORMAL LOW (ref 22–32)
Calcium: 9.7 mg/dL (ref 8.9–10.3)
Chloride: 101 mmol/L (ref 98–111)
Creatinine, Ser: 0.92 mg/dL (ref 0.61–1.24)
GFR, Estimated: >60 mL/min (ref >60)
Glucose, Bld: 121 mg/dL — ABNORMAL HIGH (ref 70–99)
Potassium: 3.7 mmol/L (ref 3.5–5.1)
Sodium: 137 mmol/L (ref 135–145)
Total Bilirubin: 1 mg/dL (ref 0.3–1.2)
Total Protein: 7.4 g/dL (ref 6.5–8.1)

## 2022-06-24 LAB — TROPONIN I (HIGH SENSITIVITY)
Troponin I (High Sensitivity): 13 ng/L (ref <18)
Troponin I (High Sensitivity): 15 ng/L (ref <18)

## 2022-06-24 MED ORDER — HYDRALAZINE HCL 20 MG/ML IJ SOLN
10.0000 mg | Freq: Once | INTRAMUSCULAR | Status: AC
  Administered 2022-06-24: 10 mg via INTRAVENOUS
  Filled 2022-06-24: qty 1

## 2022-06-24 MED ORDER — AMLODIPINE BESYLATE 10 MG PO TABS
10.0000 mg | ORAL_TABLET | Freq: Every day | ORAL | Status: DC
  Administered 2022-06-24 – 2022-06-27 (×4): 10 mg via ORAL
  Filled 2022-06-24: qty 1
  Filled 2022-06-24: qty 2
  Filled 2022-06-24: qty 1
  Filled 2022-06-24: qty 2

## 2022-06-24 MED ORDER — HYDRALAZINE HCL 20 MG/ML IJ SOLN: 10.0000 mg | Freq: Four times a day (QID) | INTRAMUSCULAR | Status: DC | PRN

## 2022-06-24 MED ORDER — ONDANSETRON HCL 4 MG/2ML IJ SOLN: 4.0000 mg | Freq: Once | INTRAMUSCULAR | Status: DC

## 2022-06-24 MED ORDER — FOLIC ACID 1 MG PO TABS
1.0000 mg | ORAL_TABLET | Freq: Every day | ORAL | Status: DC
  Administered 2022-06-24 – 2022-06-27 (×4): 1 mg via ORAL
  Filled 2022-06-24 (×4): qty 1

## 2022-06-24 MED ORDER — ASPIRIN 81 MG PO TBEC
81.0000 mg | DELAYED_RELEASE_TABLET | Freq: Every day | ORAL | Status: DC
  Administered 2022-06-24 – 2022-06-27 (×4): 81 mg via ORAL
  Filled 2022-06-24 (×4): qty 1

## 2022-06-24 MED ORDER — ENOXAPARIN SODIUM 40 MG/0.4ML IJ SOSY
40.0000 mg | PREFILLED_SYRINGE | INTRAMUSCULAR | Status: DC
  Administered 2022-06-25 – 2022-06-26 (×2): 40 mg via SUBCUTANEOUS
  Filled 2022-06-24 (×3): qty 0.4

## 2022-06-24 MED ORDER — ONDANSETRON HCL 4 MG/2ML IJ SOLN: 4.0000 mg | Freq: Four times a day (QID) | INTRAMUSCULAR | Status: DC | PRN

## 2022-06-24 MED ORDER — SODIUM CHLORIDE 0.9 % IV SOLN: INTRAVENOUS | Status: AC

## 2022-06-24 MED ORDER — THIAMINE MONONITRATE 100 MG PO TABS
100.0000 mg | ORAL_TABLET | Freq: Every day | ORAL | Status: DC
  Administered 2022-06-24 – 2022-06-27 (×4): 100 mg via ORAL
  Filled 2022-06-24 (×4): qty 1

## 2022-06-24 MED ORDER — THIAMINE HCL 100 MG/ML IJ SOLN: 100.0000 mg | Freq: Every day | INTRAMUSCULAR | Status: DC

## 2022-06-24 MED ORDER — ADULT MULTIVITAMIN W/MINERALS CH
1.0000 | ORAL_TABLET | Freq: Every day | ORAL | Status: DC
  Administered 2022-06-24 – 2022-06-27 (×4): 1 via ORAL
  Filled 2022-06-24 (×4): qty 1

## 2022-06-24 MED ORDER — HYDROMORPHONE HCL 1 MG/ML IJ SOLN
1.0000 mg | Freq: Once | INTRAMUSCULAR | Status: AC
  Administered 2022-06-24: 1 mg via INTRAVENOUS
  Filled 2022-06-24: qty 1

## 2022-06-24 MED ORDER — SODIUM CHLORIDE 0.9 % IV BOLUS
1000.0000 mL | Freq: Once | INTRAVENOUS | Status: AC
  Administered 2022-06-24: 1000 mL via INTRAVENOUS

## 2022-06-24 MED ORDER — HYDROMORPHONE HCL 1 MG/ML IJ SOLN
0.5000 mg | INTRAMUSCULAR | Status: DC | PRN
  Administered 2022-06-24 – 2022-06-25 (×2): 0.5 mg via INTRAVENOUS
  Filled 2022-06-24 (×2): qty 1

## 2022-06-24 MED ORDER — MORPHINE SULFATE (PF) 2 MG/ML IV SOLN
2.0000 mg | INTRAVENOUS | Status: DC | PRN
  Administered 2022-06-25: 2 mg via INTRAVENOUS
  Filled 2022-06-24: qty 1

## 2022-06-24 MED ORDER — LABETALOL HCL 200 MG PO TABS
100.0000 mg | ORAL_TABLET | Freq: Two times a day (BID) | ORAL | Status: DC
  Administered 2022-06-24 – 2022-06-27 (×6): 100 mg via ORAL
  Filled 2022-06-24 (×6): qty 1

## 2022-06-24 MED ORDER — MORPHINE SULFATE (PF) 4 MG/ML IV SOLN
4.0000 mg | Freq: Once | INTRAVENOUS | Status: DC
  Filled 2022-06-24: qty 1

## 2022-06-24 MED ORDER — LORAZEPAM 1 MG PO TABS: 1.0000 mg | ORAL_TABLET | ORAL | Status: DC | PRN

## 2022-06-24 MED ORDER — IOHEXOL 350 MG/ML SOLN
75.0000 mL | Freq: Once | INTRAVENOUS | Status: AC | PRN
  Administered 2022-06-24: 75 mL via INTRAVENOUS

## 2022-06-24 NOTE — H&P (Signed)
History and Physical    Patient: Angel Golden [id number] DOB: December 13, 1960 DOA: 06/24/2022 DOS: the patient was seen and examined on 06/24/2022 PCP: [facility]  Patient coming from: Home  Chief Complaint:  Chief Complaint  Patient presents with   Chest Pain   Nausea   HPI: Angel Golden is a 62 y.o. male with medical history significant of AAA s/p endovascular repair (12/2021), HTN, PAD, Cirrhosis, polysubstance abuse (ETOH, cocaine, THC) , PTSD, MDD, HLD, bipolar disorder, recurrent alcoholic pancreatitis who presents with epigastric pain.  Pain started today with associated nausea. Had been drinking more heavily the last few days. Continues to use marijuana and cocaine.  Reports that he tries to take his blood pressure medication is much as he can.  Patient last hospitalized towards the end of December with acute pancreatitis due to alochol use. CTA chest/abdomen/pelvis obtained due to his recent AAA repair revealing for for possible choledocholithiasis/pancreatic duct stone with possible pancreatitis.  MRCP showed ruled out any stones.   In the ED today, CTA chest/abdomen/pelvis obtained showed similar possibility of choledocholithiasis/pancreatic duct stone but LFTs and total bilirubin are normal which again makes any stone unlikely.  He was hypertensive up to 179/136.  Lipase elevated to 1366.  CBC unremarkable.  No significant electrolyte abnormalities.  He was given IV 10 mg hydralazine, IV Dilaudid, antiemetic and IV fluids in the ED.  Hospitalist then consulted for further management of pancreatitis.  Review of Systems: As mentioned in the history of present illness. All other systems reviewed and are negative. Past Medical History:  Diagnosis Date   Bone spur of other site    rt foot   Cirrhosis ([place])    Hepatitis C    Hypertension    PTSD (post-traumatic stress disorder)    Past Surgical History:  Procedure Laterality Date   ABDOMINAL AORTIC  ENDOVASCULAR STENT GRAFT N/A 12/28/2021   Procedure: ABDOMINAL AORTIC ENDOVASCULAR STENT GRAFT;  Surgeon: Broadus John, MD;  Location: [facility] OR;  Service: Vascular;  Laterality: N/A;   HAND SURGERY Right    HAND SURGERY Left    Social History:  reports that he has been smoking cigarettes. He has a 22.50 pack-year smoking history. He has never used smokeless tobacco. He reports current alcohol use of about 12.0 standard drinks of alcohol per week. He reports current drug use. Drugs: Cocaine and Marijuana.  No Known Allergies  Family History  Problem Relation Age of Onset   Heart failure Mother    Stroke Father 98       first one   Heart failure Sister    Cancer - Lung Sister     Prior to Admission medications   Medication Sig Start Date End Date Taking? Authorizing Provider  amLODipine (NORVASC) 10 MG tablet Take 1 tablet (10 mg total) by mouth daily. 05/10/22   Regalado, Jerald Kief A, MD  aspirin EC 81 MG tablet Take 1 tablet (81 mg total) by mouth daily. Swallow whole. 12/07/21   Patwardhan, Reynold Bowen, MD  atorvastatin (LIPITOR) 40 MG tablet Take 1 tablet (40 mg total) by mouth daily. 12/05/21   Dameron, Luna Fuse, DO  Cyanocobalamin (B-12 PO) Take 1 tablet by mouth daily.    [provider]  escitalopram (LEXAPRO) 10 MG tablet Take 10 mg by mouth daily. 05/21/17   [provider]  hydrocortisone (ANUSOL-HC) 2.5 % rectal cream Place 1 application  rectally 2 (two) times daily. Patient would like his medicines mailed to him Patient taking differently:  Place 1 application  rectally 2 (two) times daily as needed for hemorrhoids. Patient would like his medicines mailed to him 10/25/21   Chase Picket, MD  ibuprofen (ADVIL) 600 MG tablet Take 600 mg by mouth 3 (three) times daily as needed (pain.).    [provider]  labetalol (NORMODYNE) 100 MG tablet Take 1 tablet (100 mg total) by mouth 2 (two) times daily. 12/07/21   Patwardhan, Reynold Bowen, MD  pantoprazole (PROTONIX) 40  MG tablet Take 1 tablet (40 mg total) by mouth daily. 05/09/22 05/09/23  Regalado, Jerald Kief A, MD  traZODone (DESYREL) 100 MG tablet Take 1 tablet (100 mg total) by mouth at bedtime as needed for sleep. 05/10/20   Money, Lowry Ram, FNP  triamcinolone (KENALOG) 0.025 % cream Apply 1 Application topically 2 (two) times daily as needed (rash). 05/21/17   [provider]  witch hazel-glycerin (TUCKS) pad Apply 1 Application topically 4 (four) times daily as needed (irritaiton after bowel movement).    [provider]    Physical Exam: Vitals:   06/24/22 1815 06/24/22 1830 06/24/22 1915 06/24/22 2000  BP: (!) 153/94 (!) 170/104 (!) 158/98 (!) 169/113  Pulse:   90 85  Resp: 14 14 17 15  $ Temp:      TempSrc:      SpO2:   97% 96%  Weight:      Height:       Constitutional: NAD, calm, comfortable, elderly male laying flat in bed Eyes: PERRL, lids and conjunctivae normal ENMT: Mucous membranes are moist.  Neck: normal, supple Respiratory: clear to auscultation bilaterally, no wheezing, no crackles. Normal respiratory effort. No accessory muscle use.  Cardiovascular: Regular rate and rhythm, no murmurs / rubs / gallops. No extremity edema.  Abdomen: Soft, nontender nondistended.  Bowel sounds positive.  Musculoskeletal: no clubbing / cyanosis. No joint deformity upper and lower extremities. Normal muscle tone.  Skin: no rashes, lesions, ulcers. No induration Neurologic: CN 2-12 grossly intact.  Strength 5/5 in all 4.  Psychiatric: Normal judgment and insight. Alert and oriented x 3. Normal mood. Data Reviewed:  See HPI  Assessment and Plan: * Acute pancreatitis -secondary to alcohol abuse -CTA chest/abdomen/pelvis obtained showed similar possibility of choledocholithiasis/pancreatic duct stone as seen in last admission in December but this was ruled out last time with MRCP. Also, LFTs and total bilirubin are normal which again makes any stone unlikely. -continue aggressive IV  fluid hydration -full liquid diet and can advance as tolerated -PRN IV opioid pain control and IV antiemetics -Patient reports that he is going into a 12-monthdrug rehab soon  Thoracic ascending aortic aneurysm (HCC) - 4 cm aneurysm noted on CTA chest.  Recommend annual imaging with CTA or MRA. [place] [facility] [place]  Alcohol abuse - Placed on CIWA protocol  Hypertension Elevated upon arrival likely due to pain and noncompliance. -Continue amlodipine -Continue labetalol -PRN hydralazine  Infrarenal abdominal aortic aneurysm (AAA) without rupture (HCC) S/p endovascular repair on 12/2021. CTA A/P today shows stable graft stent.  Polysubstance dependence ([place] Continual marijuana and cocaine abuse.  Recommend cessation      Advance Care Planning: Full  Consults: None  Family Communication: none at bedside  Severity of Illness: The appropriate patient status for this patient is OBSERVATION. Observation status is judged to be reasonable and necessary in order to provide the required intensity of service to ensure the patient's safety. The patient's presenting symptoms, physical exam findings, and initial radiographic and laboratory data in the  context of their medical condition is felt to place them at decreased risk for further clinical deterioration. Furthermore, it is anticipated that the patient will be medically stable for discharge from the hospital within 2 midnights of admission.   Author: Orene Desanctis, DO 06/24/2022 8:36 PM  For on call review [email or web address].

## 2022-06-24 NOTE — ED Triage Notes (Signed)
Pt arrived via [facility] from home reporting 7/10 center chest pain, no radiation, with nausea and vomiting starting at 9 am today, significantly worsening 30 minutes ago. Pt reports significant pressure in chest feeling similar to event leading up to stent placement in Novemeber 2023. EMS administered 324 ASA, 1 SL Nitro with no improvement, and 18m Zofran via 18g LAC.   BP 190/120 HR 120 SPO2 92% RR 28 CBG 130

## 2022-06-24 NOTE — Assessment & Plan Note (Addendum)
-   4 cm aneurysm noted on CTA chest.  Recommend annual imaging with CTA or MRA. Follows with the [place].

## 2022-06-24 NOTE — Assessment & Plan Note (Signed)
-   Placed on CIWA protocol

## 2022-06-24 NOTE — Assessment & Plan Note (Addendum)
Elevated upon arrival likely due to pain and noncompliance. -Continue amlodipine -Continue labetalol -PRN hydralazine

## 2022-06-24 NOTE — ED Provider Notes (Signed)
[facility] Provider Note   CSN: [id number] Arrival date & time: 06/24/22  1655     History  Chief Complaint  Patient presents with   Chest Pain   Nausea    Angel Golden is a 62 y.o. male history of pancreatitis, CAD with stents, hypertension here presenting with chest pain abdominal pain.  Patient states that he has severe crushing chest pain started around 9 AM today.  Patient states that this is similar to when he was admitted for pancreatitis.  He also states that he had stents placed in his heart several months ago as well.  Patient does still drink beer.  Patient was noted to be hypertensive per EMS and was given aspirin and nitro by EMS.  He states that he still in significant amount of pain.  The history is provided by the patient.       Home Medications Prior to Admission medications   Medication Sig Start Date End Date Taking? Authorizing Provider  amLODipine (NORVASC) 10 MG tablet Take 1 tablet (10 mg total) by mouth daily. 05/10/22   Regalado, Jerald Kief A, MD  aspirin EC 81 MG tablet Take 1 tablet (81 mg total) by mouth daily. Swallow whole. 12/07/21   Patwardhan, Reynold Bowen, MD  atorvastatin (LIPITOR) 40 MG tablet Take 1 tablet (40 mg total) by mouth daily. 12/05/21   Dameron, Luna Fuse, DO  Cyanocobalamin (B-12 PO) Take 1 tablet by mouth daily.    [provider]  escitalopram (LEXAPRO) 10 MG tablet Take 10 mg by mouth daily. 05/21/17   [provider]  hydrocortisone (ANUSOL-HC) 2.5 % rectal cream Place 1 application  rectally 2 (two) times daily. Patient would like his medicines mailed to him Patient taking differently: Place 1 application  rectally 2 (two) times daily as needed for hemorrhoids. Patient would like his medicines mailed to him 10/25/21   Chase Picket, MD  ibuprofen (ADVIL) 600 MG tablet Take 600 mg by mouth 3 (three) times daily as needed (pain.).    [provider]  labetalol (NORMODYNE)  100 MG tablet Take 1 tablet (100 mg total) by mouth 2 (two) times daily. 12/07/21   Patwardhan, Reynold Bowen, MD  pantoprazole (PROTONIX) 40 MG tablet Take 1 tablet (40 mg total) by mouth daily. 05/09/22 05/09/23  Regalado, Jerald Kief A, MD  traZODone (DESYREL) 100 MG tablet Take 1 tablet (100 mg total) by mouth at bedtime as needed for sleep. 05/10/20   Money, Lowry Ram, FNP  triamcinolone (KENALOG) 0.025 % cream Apply 1 Application topically 2 (two) times daily as needed (rash). 05/21/17   [provider]  witch hazel-glycerin (TUCKS) pad Apply 1 Application topically 4 (four) times daily as needed (irritaiton after bowel movement).    [provider]      Allergies    Patient has no known allergies.    Review of Systems   Review of Systems  Cardiovascular:  Positive for chest pain.  All other systems reviewed and are negative.   Physical Exam Updated Vital Signs BP (!) 162/108   Pulse 88   Resp (!) 25   Ht 5' 10"$  (1.778 m)   Wt 68 kg   SpO2 99%   BMI 21.52 kg/m  Physical Exam Vitals and nursing note reviewed.  Constitutional:      Comments: Uncomfortable and vomiting  HENT:     Head: Normocephalic.  Eyes:     Extraocular Movements: Extraocular movements intact.  Pupils: Pupils are equal, round, and reactive to light.  Cardiovascular:     Rate and Rhythm: Normal rate and regular rhythm.     Heart sounds: Normal heart sounds.  Pulmonary:     Effort: Pulmonary effort is normal.     Breath sounds: Normal breath sounds.  Abdominal:     General: Bowel sounds are normal.     Palpations: Abdomen is soft.  Musculoskeletal:        General: Normal range of motion.     Cervical back: Normal range of motion and neck supple.  Skin:    General: Skin is warm.     Capillary Refill: Capillary refill takes less than 2 seconds.  Neurological:     General: No focal deficit present.     Mental Status: He is alert and oriented to person, place, and time.  Psychiatric:         Mood and Affect: Mood normal.        Behavior: Behavior normal.     ED Results / Procedures / Treatments   Labs (all labs ordered are listed, but only abnormal results are displayed) Labs Reviewed  CBC WITH DIFFERENTIAL/PLATELET  COMPREHENSIVE METABOLIC PANEL  LIPASE, BLOOD  TROPONIN I (HIGH SENSITIVITY)    EKG EKG Interpretation  Date/Time:  Sunday June 24 2022 17:04:14 EST Ventricular Rate:  92 PR Interval:  144 QRS Duration: 134 QT Interval:  391 QTC Calculation: 484 R Axis:   76 Text Interpretation: Sinus rhythm Right bundle branch block Borderline ST elevation, anterolateral leads No significant change since last tracing Confirmed by Wandra Arthurs [phone]) on 06/24/2022 5:12:09 PM  Radiology No results found.  Procedures Procedures    Medications Ordered in ED Medications  ondansetron (ZOFRAN) injection 4 mg (0 mg Intravenous Hold 06/24/22 1716)  hydrALAZINE (APRESOLINE) injection 10 mg (10 mg Intravenous Given 06/24/22 1742)  HYDROmorphone (DILAUDID) injection 1 mg (1 mg Intravenous Given 06/24/22 1715)    ED Course/ Medical Decision Making/ A&P                             Medical Decision Making Angel Golden is a 62 y.o. male here presenting with chest pain abdominal pain.  Patient is hypertensive in acute distress.  Concern for recurrent pancreatitis versus dissection versus ACS.  Plan to get CBC and CMP and lipase and troponin and CT dissection study.  7:32 PM I reviewed patient's labs and independently interpreted imaging studies.  Labs showed lipase of 1300.  CT showed small stone within the CBD that is unchanged.  Patient has no elevated alk phos or LFTs.  Patient does have pancreatitis on CT.  Patient will be admitted for pain control.   Problems Addressed: Acute pancreatitis, unspecified complication status, unspecified pancreatitis type: acute illness or injury  Amount and/or Complexity of Data Reviewed Labs: ordered. Decision-making details  documented in ED Course. Radiology: ordered and independent interpretation performed. Decision-making details documented in ED Course. ECG/medicine tests: ordered and independent interpretation performed. Decision-making details documented in ED Course.  Risk Prescription drug management.    Final Clinical Impression(s) / ED Diagnoses Final diagnoses:  None    Rx / DC Orders ED Discharge Orders     None         Drenda Freeze, MD 06/24/22 [phone]

## 2022-06-24 NOTE — Assessment & Plan Note (Signed)
S/p endovascular repair on 12/2021. CTA A/P today shows stable graft stent.

## 2022-06-24 NOTE — Assessment & Plan Note (Addendum)
Continual marijuana and cocaine abuse.  Recommend cessation

## 2022-06-24 NOTE — Assessment & Plan Note (Signed)
-  secondary to alcohol abuse -CTA chest/abdomen/pelvis obtained showed similar possibility of choledocholithiasis/pancreatic duct stone as seen in last admission in December but this was ruled out last time with MRCP. Also, LFTs and total bilirubin are normal which again makes any stone unlikely. -continue aggressive IV fluid hydration -full liquid diet and can advance as tolerated -PRN IV opioid pain control and IV antiemetics -Patient reports that he is going into a 82-month drug rehab soon

## 2022-06-25 ENCOUNTER — Encounter (HOSPITAL_COMMUNITY): Payer: Self-pay | Admitting: Internal Medicine

## 2022-06-25 DIAGNOSIS — Z95828 Presence of other vascular implants and grafts: Secondary | ICD-10-CM | POA: Diagnosis not present

## 2022-06-25 DIAGNOSIS — Z91199 Patient's noncompliance with other medical treatment and regimen due to unspecified reason: Secondary | ICD-10-CM | POA: Diagnosis not present

## 2022-06-25 DIAGNOSIS — I7121 Aneurysm of the ascending aorta, without rupture: Secondary | ICD-10-CM | POA: Diagnosis not present

## 2022-06-25 DIAGNOSIS — I739 Peripheral vascular disease, unspecified: Secondary | ICD-10-CM | POA: Diagnosis not present

## 2022-06-25 DIAGNOSIS — I7143 Infrarenal abdominal aortic aneurysm, without rupture: Secondary | ICD-10-CM | POA: Diagnosis not present

## 2022-06-25 DIAGNOSIS — T461X6A Underdosing of calcium-channel blockers, initial encounter: Secondary | ICD-10-CM | POA: Diagnosis not present

## 2022-06-25 DIAGNOSIS — Z955 Presence of coronary angioplasty implant and graft: Secondary | ICD-10-CM | POA: Diagnosis not present

## 2022-06-25 DIAGNOSIS — Z79899 Other long term (current) drug therapy: Secondary | ICD-10-CM | POA: Diagnosis not present

## 2022-06-25 DIAGNOSIS — Z823 Family history of stroke: Secondary | ICD-10-CM | POA: Diagnosis not present

## 2022-06-25 DIAGNOSIS — K852 Alcohol induced acute pancreatitis without necrosis or infection: Secondary | ICD-10-CM | POA: Diagnosis not present

## 2022-06-25 DIAGNOSIS — Z9185 Personal history of military service: Secondary | ICD-10-CM | POA: Diagnosis not present

## 2022-06-25 DIAGNOSIS — I2510 Atherosclerotic heart disease of native coronary artery without angina pectoris: Secondary | ICD-10-CM | POA: Diagnosis not present

## 2022-06-25 DIAGNOSIS — Z8249 Family history of ischemic heart disease and other diseases of the circulatory system: Secondary | ICD-10-CM | POA: Diagnosis not present

## 2022-06-25 DIAGNOSIS — Z8679 Personal history of other diseases of the circulatory system: Secondary | ICD-10-CM | POA: Diagnosis not present

## 2022-06-25 DIAGNOSIS — F1410 Cocaine abuse, uncomplicated: Secondary | ICD-10-CM | POA: Diagnosis not present

## 2022-06-25 DIAGNOSIS — K859 Acute pancreatitis without necrosis or infection, unspecified: Secondary | ICD-10-CM | POA: Diagnosis not present

## 2022-06-25 DIAGNOSIS — R69 Illness, unspecified: Secondary | ICD-10-CM | POA: Diagnosis not present

## 2022-06-25 DIAGNOSIS — T448X6A Underdosing of centrally-acting and adrenergic-neuron-blocking agents, initial encounter: Secondary | ICD-10-CM | POA: Diagnosis not present

## 2022-06-25 DIAGNOSIS — E871 Hypo-osmolality and hyponatremia: Secondary | ICD-10-CM | POA: Diagnosis not present

## 2022-06-25 DIAGNOSIS — F319 Bipolar disorder, unspecified: Secondary | ICD-10-CM | POA: Diagnosis not present

## 2022-06-25 DIAGNOSIS — F1010 Alcohol abuse, uncomplicated: Secondary | ICD-10-CM | POA: Diagnosis not present

## 2022-06-25 DIAGNOSIS — F1920 Other psychoactive substance dependence, uncomplicated: Secondary | ICD-10-CM | POA: Diagnosis not present

## 2022-06-25 DIAGNOSIS — K7460 Unspecified cirrhosis of liver: Secondary | ICD-10-CM | POA: Diagnosis not present

## 2022-06-25 DIAGNOSIS — F1220 Cannabis dependence, uncomplicated: Secondary | ICD-10-CM | POA: Diagnosis not present

## 2022-06-25 DIAGNOSIS — F17210 Nicotine dependence, cigarettes, uncomplicated: Secondary | ICD-10-CM | POA: Diagnosis not present

## 2022-06-25 DIAGNOSIS — I10 Essential (primary) hypertension: Secondary | ICD-10-CM | POA: Diagnosis not present

## 2022-06-25 DIAGNOSIS — F4310 Post-traumatic stress disorder, unspecified: Secondary | ICD-10-CM | POA: Diagnosis not present

## 2022-06-25 DIAGNOSIS — E785 Hyperlipidemia, unspecified: Secondary | ICD-10-CM | POA: Diagnosis not present

## 2022-06-25 LAB — CBC
HCT: 39.5 % (ref 39.0–52.0)
Hemoglobin: 13.4 g/dL (ref 13.0–17.0)
MCH: 30.5 pg (ref 26.0–34.0)
MCHC: 33.9 g/dL (ref 30.0–36.0)
MCV: 89.8 fL (ref 80.0–100.0)
Platelets: 194 10*3/uL (ref 150–400)
RBC: 4.4 MIL/uL (ref 4.22–5.81)
RDW: 14 % (ref 11.5–15.5)
WBC: 6.9 10*3/uL (ref 4.0–10.5)
nRBC: 0 % (ref 0.0–0.2)

## 2022-06-25 LAB — BASIC METABOLIC PANEL
Anion gap: 10 (ref 5–15)
BUN: <5 mg/dL — ABNORMAL LOW (ref 6–20)
CO2: 20 mmol/L — ABNORMAL LOW (ref 22–32)
Calcium: 8.7 mg/dL — ABNORMAL LOW (ref 8.9–10.3)
Chloride: 104 mmol/L (ref 98–111)
Creatinine, Ser: 0.8 mg/dL (ref 0.61–1.24)
GFR, Estimated: >60 mL/min (ref >60)
Glucose, Bld: 116 mg/dL — ABNORMAL HIGH (ref 70–99)
Potassium: 3.5 mmol/L (ref 3.5–5.1)
Sodium: 134 mmol/L — ABNORMAL LOW (ref 135–145)

## 2022-06-25 NOTE — Consult Note (Addendum)
Consultation  Referring Provider:   Dr. Starla Link Primary Care Physician:  [facility], [facility] Primary Gastroenterologist:    [facility], [facility] (unassigned here)     Reason for Consultation: Recurrent alcoholic pancreatitis            HPI:   Angel Golden is a 62 y.o. male with past medical history significant of cirrhosis with known varices, AAA status post endovascular repair (8/23), hypertension, PAD, polysubstance abuse and (alcohol, cocaine and THC), PTSD, MDD, HLD, bipolar disorder, recurrent alcoholic pancreatitis who presented to the ED on 06/24/2022 with epigastric pain.    At time of presentation patient reported that pain had started yesterday with nausea, apparently had been drinking more heavily over the past few days and continue to use marijuana and cocaine.  Apparently patient last hospitalized towards the end of December with acute pancreatitis due to alcohol use.  CTA chest/abdomen/pelvis obtained due to his recent AAA repair revealing for possible choledocholithiasis/pancreatic duct stone with possible pancreatitis.  MRCP without stones.    Today, patient tells me that he was drinking "a lot", when asked to quantify he just kept saying "a lot", of liquor and beer over the past 72 hours, tells me he would have kept drinking, but he started with severe epigastric pain that felt consistent with his last episode of pancreatitis which brought him to the ER.  Also with a lot of nausea and vomiting.  Currently, his pain is a 6/10 and patient tells me it is tolerable, no fuirther vomiting this morning.  He did try to eat some grits this morning but this made his nausea and pain worse.  He is working with the [facility] to get placed at a 57-monthrehab for polysubstance abuse.    Denies fever, chills or change in bowel habits.  ED course: CT chest/abdomen/pelvis obtained showed similar possibility of choledocholithiasis/pancreatic duct stone but LFTs and total bili are normal.   Patient hypertensive up to 179/136, lipase elevated to 1366, CBC unremarkable.  Given 10 mg hydralazine and IV Dilaudid as well as an antiemetic and IV fluids.  Pancreatitis history: 05/02/2022-05/09/2022 admission for alcoholic pancreatitis during that admission CTA showed choledocholithiasis and pancreatic duct stone possible, follow-up MRCP was negative, he improved with supportive care and was discharged to inpatient alcohol rehab  Past Medical History:  Diagnosis Date   Bone spur of other site    rt foot   Cirrhosis ([place]    Hepatitis C    Hypertension    PTSD (post-traumatic stress disorder)     Past Surgical History:  Procedure Laterality Date   ABDOMINAL AORTIC ENDOVASCULAR STENT GRAFT N/A 12/28/2021   Procedure: ABDOMINAL AORTIC ENDOVASCULAR STENT GRAFT;  Surgeon: RBroadus John MD;  [facility];  Service: Vascular;  Laterality: N/A;   HAND SURGERY Right    HAND SURGERY Left     Family History  Problem Relation Age of Onset   Heart failure Mother    Stroke Father 675      first one   Heart failure Sister    Cancer - Lung Sister     Social History   Tobacco Use   Smoking status: Some Days    Packs/day: 0.50    Years: 45.00    Total pack years: 22.50    Types: Cigarettes   Smokeless tobacco: Never  Vaping Use   Vaping Use: Never used  Substance Use Topics   Alcohol use: Yes    Alcohol/week: 12.0 standard  drinks of alcohol    Types: 12 Cans of beer per week    Comment: occ   Drug use: Yes    Types: Cocaine, Marijuana    Comment: Not since recent diagnosis    Prior to Admission medications   Medication Sig Start Date End Date Taking? Authorizing Provider  amLODipine (NORVASC) 10 MG tablet Take 1 tablet (10 mg total) by mouth daily. 05/10/22   Regalado, Jerald Kief A, MD  aspirin EC 81 MG tablet Take 1 tablet (81 mg total) by mouth daily. Swallow whole. 12/07/21   Patwardhan, Reynold Bowen, MD  atorvastatin (LIPITOR) 40 MG tablet Take 1 tablet (40 mg total) by  mouth daily. 12/05/21   Dameron, Luna Fuse, DO  Cyanocobalamin (B-12 PO) Take 1 tablet by mouth daily.    [provider]  escitalopram (LEXAPRO) 10 MG tablet Take 10 mg by mouth daily. 05/21/17   [provider]  hydrocortisone (ANUSOL-HC) 2.5 % rectal cream Place 1 application  rectally 2 (two) times daily. Patient would like his medicines mailed to him Patient taking differently: Place 1 application  rectally 2 (two) times daily as needed for hemorrhoids. Patient would like his medicines mailed to him 10/25/21   Chase Picket, MD  ibuprofen (ADVIL) 600 MG tablet Take 600 mg by mouth 3 (three) times daily as needed (pain.).    [provider]  labetalol (NORMODYNE) 100 MG tablet Take 1 tablet (100 mg total) by mouth 2 (two) times daily. 12/07/21   Patwardhan, Reynold Bowen, MD  pantoprazole (PROTONIX) 40 MG tablet Take 1 tablet (40 mg total) by mouth daily. 05/09/22 05/09/23  Regalado, Jerald Kief A, MD  traZODone (DESYREL) 100 MG tablet Take 1 tablet (100 mg total) by mouth at bedtime as needed for sleep. 05/10/20   Money, Lowry Ram, FNP  triamcinolone (KENALOG) 0.025 % cream Apply 1 Application topically 2 (two) times daily as needed (rash). 05/21/17   [provider]  witch hazel-glycerin (TUCKS) pad Apply 1 Application topically 4 (four) times daily as needed (irritaiton after bowel movement).    [provider]    Current Facility-Administered Medications  Medication Dose Route Frequency Provider Last Rate Last Admin   0.9 %  sodium chloride infusion   Intravenous Continuous Tu, Ching T, DO 150 mL/hr at 06/24/22 2153 New Bag at 06/24/22 2153   amLODipine (NORVASC) tablet 10 mg  10 mg Oral Daily Tu, Ching T, DO   10 mg at 06/25/22 1005   aspirin EC tablet 81 mg  81 mg Oral Daily Tu, Ching T, DO   81 mg at 06/25/22 1005   enoxaparin (LOVENOX) injection 40 mg  40 mg Subcutaneous Q24H Tu, Ching T, DO   40 mg at [id number]   folic acid (FOLVITE) tablet 1 mg  1 mg  Oral Daily Tu, Ching T, DO   1 mg at 06/25/22 1004   hydrALAZINE (APRESOLINE) injection 10 mg  10 mg Intravenous Q6H PRN Tu, Ching T, DO       HYDROmorphone (DILAUDID) injection 0.5 mg  0.5 mg Intravenous Q4H PRN Tu, Ching T, DO   0.5 mg at 06/25/22 0345   labetalol (NORMODYNE) tablet 100 mg  100 mg Oral BID Tu, Ching T, DO   100 mg at 06/25/22 1004   LORazepam (ATIVAN) tablet 1-4 mg  1-4 mg Oral Q4H PRN Tu, Ching T, DO       morphine (PF) 2 MG/ML injection 2 mg  2 mg Intravenous Q3H PRN Ileene Musa  T, DO       multivitamin with minerals tablet 1 tablet  1 tablet Oral Daily Tu, Ching T, DO   1 tablet at 06/25/22 1004   ondansetron (ZOFRAN) injection 4 mg  4 mg Intravenous Once Drenda Freeze, MD       ondansetron [facility]) injection 4 mg  4 mg Intravenous Q6H PRN Tu, Ching T, DO       thiamine (VITAMIN B1) tablet 100 mg  100 mg Oral Daily Tu, Ching T, DO   100 mg at 06/25/22 1004   Or   thiamine (VITAMIN B1) injection 100 mg  100 mg Intravenous Daily Tu, Ching T, DO       Current Outpatient Medications  Medication Sig Dispense Refill   amLODipine (NORVASC) 10 MG tablet Take 1 tablet (10 mg total) by mouth daily. 30 tablet 0   aspirin EC 81 MG tablet Take 1 tablet (81 mg total) by mouth daily. Swallow whole. 90 tablet 3   atorvastatin (LIPITOR) 40 MG tablet Take 1 tablet (40 mg total) by mouth daily. 30 tablet 1   Cyanocobalamin (B-12 PO) Take 1 tablet by mouth daily.     escitalopram (LEXAPRO) 10 MG tablet Take 10 mg by mouth daily.     hydrocortisone (ANUSOL-HC) 2.5 % rectal cream Place 1 application  rectally 2 (two) times daily. Patient would like his medicines mailed to him (Patient taking differently: Place 1 application  rectally 2 (two) times daily as needed for hemorrhoids. Patient would like his medicines mailed to him) 30 g 0   ibuprofen (ADVIL) 600 MG tablet Take 600 mg by mouth 3 (three) times daily as needed (pain.).     labetalol (NORMODYNE) 100 MG tablet Take 1 tablet (100 mg  total) by mouth 2 (two) times daily. 60 tablet 3   pantoprazole (PROTONIX) 40 MG tablet Take 1 tablet (40 mg total) by mouth daily. 30 tablet 1   traZODone (DESYREL) 100 MG tablet Take 1 tablet (100 mg total) by mouth at bedtime as needed for sleep. 30 tablet 0   triamcinolone (KENALOG) 0.025 % cream Apply 1 Application topically 2 (two) times daily as needed (rash).     witch hazel-glycerin (TUCKS) pad Apply 1 Application topically 4 (four) times daily as needed (irritaiton after bowel movement).      Allergies as of 06/24/2022   (No Known Allergies)     Review of Systems:    Constitutional: No weight loss, fever or chills Skin: No rash  Cardiovascular: +chest pain Respiratory: No SOB  Gastrointestinal: See HPI and otherwise negative Genitourinary: No dysuria Neurological: No headache, dizziness or syncope Musculoskeletal: No new muscle or joint pain Hematologic: No bleeding  Psychiatric: No history of depression or anxiety    Physical Exam:  Vital signs in last 24 hours: Temp:  [98.1 F (36.7 C)-98.5 F (36.9 C)] 98.4 F (36.9 C) (02/12 0736) Pulse Rate:  [73-90] 77 (02/12 0736) Resp:  [12-46] 23 (02/12 0736) BP: (123-183)/(84-139) 123/84 (02/12 0736) SpO2:  [94 %-99 %] 97 % (02/12 0736) Weight:  [68 kg] 68 kg (02/11 1703)   General:  AA male appears to be in NAD, Well developed, Well nourished, alert and cooperative Head:  Normocephalic and atraumatic. Eyes:   PEERL, EOMI. No icterus. Conjunctiva pink. Ears:  Normal auditory acuity. Neck:  Supple Throat: Oral cavity and pharynx without inflammation, swelling or lesion. Lungs: Respirations even and unlabored. Lungs clear to auscultation bilaterally.   No wheezes, crackles, or rhonchi.  Heart: Normal S1, S2. No MRG. Regular rate and rhythm. No peripheral edema, cyanosis or pallor.  Abdomen:  Soft, nondistended, marked epigastric ttp with involuntary guarding. Normal bowel sounds. No appreciable masses or  hepatomegaly. Rectal:  Not performed.  Msk:  Symmetrical without gross deformities. Peripheral pulses intact.  Extremities:  Without edema, no deformity or joint abnormality.  Neurologic:  Alert and  oriented x4;  grossly normal neurologically.  Skin:   Dry and intact without significant lesions or rashes. Psychiatric: Demonstrates good judgement and reason without abnormal affect or behaviors.   LAB RESULTS: Recent Labs    06/24/22 1659 06/25/22 0400  WBC 7.8 6.9  HGB 14.8 13.4  HCT 43.8 39.5  PLT 237 194   BMET Recent Labs    06/24/22 1659 06/25/22 0400  NA 137 134*  K 3.7 3.5  CL 101 104  CO2 21* 20*  GLUCOSE 121* 116*  BUN 5* <5*  CREATININE 0.92 0.80  CALCIUM 9.7 8.7*   LFT Recent Labs    06/24/22 1659  PROT 7.4  ALBUMIN 4.3  AST 28  ALT 15  ALKPHOS 84  BILITOT 1.0   STUDIES: CT Angio Chest/Abd/Pel for Dissection W and/or Wo Contrast  Result Date: 06/24/2022 CLINICAL DATA:  Chest pain. EXAM: CT ANGIOGRAPHY CHEST, ABDOMEN AND PELVIS TECHNIQUE: Multidetector CT imaging through the chest, abdomen and pelvis was performed using the standard protocol during bolus administration of intravenous contrast. Multiplanar reconstructed images and MIPs were obtained and reviewed to evaluate the vascular anatomy. RADIATION DOSE REDUCTION: This exam was performed according to the departmental dose-optimization program which includes automated exposure control, adjustment of the mA and/or kV according to patient size and/or use of iterative reconstruction technique. CONTRAST:  62m OMNIPAQUE IOHEXOL 350 MG/ML SOLN COMPARISON:  05/06/2022. FINDINGS: CTA CHEST FINDINGS Cardiovascular: Ascending thoracic aorta dilated to 4 cm. No aortic dissection. Mild aortic atherosclerosis. No penetrating ulcer. No mural hematoma. Aortic arch branch vessels are widely patent. Heart normal in size and configuration. Mild left coronary artery calcifications. No pericardial effusion.  Mediastinum/Nodes: No enlarged mediastinal, hilar, or axillary lymph nodes. Thyroid gland, trachea, and esophagus demonstrate no significant findings. Lungs/Pleura: Clear lungs. Few small paraseptal emphysematous cysts, medial left upper lobe. No pleural effusion. No pneumothorax. Musculoskeletal: No fracture or acute finding. No bone lesion. Mild thoracic spine disc degenerative changes. Review of the MIP images confirms the above findings. CTA ABDOMEN AND PELVIS FINDINGS VASCULAR Aorta: Previous stenting of an infrarenal abdominal aortic aneurysm. Native aneurysm measures 4.9 cm. No contrast enhancement outside of the aortic stent to suggest an endoleak. Aortic lumen widely patent. No dissection. Celiac: Patent without evidence of aneurysm, dissection, vasculitis or significant stenosis. SMA: Patent without evidence of aneurysm, dissection, vasculitis or significant stenosis. Renals: Both renal arteries are patent without evidence of aneurysm, dissection, vasculitis, fibromuscular dysplasia or significant stenosis. IMA: Occluded. Inflow: Widely patent common iliac artery stents. Veins: No obvious venous abnormality within the limitations of this arterial phase study. Review of the MIP images confirms the above findings. NON-VASCULAR Hepatobiliary: No focal liver abnormality is seen. No gallstones, gallbladder Oleski thickening, or biliary dilatation. Pancreas: No pancreatic mass. Pancreatic duct dilation to 6-7 mm. Small stone projects near the ampullary Vater, appearing to reside in the distal common bile duct. However, this is unchanged from the prior CT and is more likely within the adjacent pancreatic head. There is fluid attenuation within the fat surrounding the pancreas. Spleen: Normal in size without focal abnormality. Adrenals/Urinary Tract: No adrenal mass. Kidneys  normal in size, orientation and position with symmetric enhancement. No renal mass. No convincing stone. No hydronephrosis. Ureters not well  visualized but grossly normal in course and in caliber. Bladder unremarkable. Stomach/Bowel: Normal stomach. Small bowel and colon are normal in caliber. No Keeling thickening or convincing inflammation. Lymphatic: No discrete enlarged lymph nodes. Reproductive: Unremarkable. Other: Venous collaterals noted in the upper abdomen adjacent to the spleen and pancreatic tail. Musculoskeletal: No fracture or acute finding.  No bone lesion. Review of the MIP images confirms the above findings. IMPRESSION: CTA FINDINGS 1. No aortic dissection.  No evidence of acute aortic syndrome. 2. 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 Recommend annual imaging followup by CTA or MRA. This recommendation follows 2010 ACCF/AHA/AATS/ACR/ASA/SCA/SCAI/SIR/STS/SVM Guidelines for the Diagnosis and Management of Patients with Thoracic Aortic Disease. Circulation. 2010; [id number]. Aortic aneurysm NOS (ICD10-I71.9) 3. Previous repair of an infrarenal abdominal aortic aneurysm. Appear stable from the prior CT. NON CTA FINDINGS 1. Small stone projects near the ampulla Vater possibly within the distal common bile duct or pancreatic duct, unchanged when compared to the prior CT. Stability since the prior CT argues against a common bile duct stone. Pancreatic duct dilation and peripancreatic fluid attenuation is similar to the prior CT, but again is suspicious for pancreatitis if there are consistent clinical findings. Current arterial exam is suboptimal for assessment of the pancreas. 2. No other evidence of an acute abnormality within the abdomen or pelvis. No evidence of an acute abnormality in the chest. Electronically Signed   By: Lajean Manes M.D.   On: 06/24/2022 19:18      Impression / Plan:   Impression: 1.  Acute alcoholic pancreatitis: CTA chest/abdomen/pelvis shows similar possibility of choledocholithiasis/pancreatic duct stone seen at last admission in December but this was ruled out with an MRCP at that time,  LFTs and total bili are normal, currently on aggressive IV fluid hydration and as needed IV Opioid pain control and IV antiemetics, apparently patient reports she is going into a 59-monthdrug rehab soon 2.  Thoracic ascending aortic aneurysm: 4 cm aneurysm noted on CTA chest 3.  Alcohol abuse 4.  Polysubstance dependence: On Marijuana and Cocaine 5.  Hypertension 6.  Infrarenal abdominal aortic aneurysm without rupture: Status post endovascular repair 12/2021  Plan: 1.  Agree with patient's plans for 663-monthehab for his polysubstance abuse.  [place] [facility] [place].  Agree with normal saline at 150 mL/h 3.  Continue as needed antiemetics and analgesics as ordered 4.  Will discuss possibly repeating MRCP with Dr. CuCandis Schatz LFTs are normal though, making stones less likely. 5.  For now we will keep the patient on a clear liquid diet as he could not tolerate grits this morning.  Thank you for your kind consultation, we will continue to follow.  [facility]2/04/2023, 10:33 AM   I have taken a history, reviewed the chart and examined the patient. I performed a substantive portion of this encounter, including complete performance of at least one of the key components, in conjunction with the APP. I agree with the APP's note, impression and recommendations  [age]ear old male with history of alcohol-induced pancreatitis, readmitted for the same.  GI consulted because of potential stone seen in the distal CBD vs pancreatic head.  This finding was seen on previous CT in December and a subsequent MRCP did not reveal any stone in the pancreatic duct or common bile  duct.  The CT findings this admission appears consistent with the previous CT.  Additionally, his liver enzymes are normal and there is no evidence of biliary dilation.  His pancreatitis can be clearly attributable to his excessive alcohol use.  I do not think a repeat MRCP needs to completed to re-evaluate  the same abnormality seen on CT in December.  Recommend continued supportive care for acute pancreatitis with IV fluids (consider switching to LR), analgesia. Agree with clear liquid diet today, consider advancing to low-fat tomorrow if tolerating CLD well. Alcohol rehab will be essential for the patient's long term health. GI will sign off for now.  Please reconsult if the patient is not improving.  Jaymere Alen E. Candis Schatz, MD [facility]

## 2022-06-25 NOTE — Progress Notes (Signed)
PROGRESS NOTE    Angel Golden  [id number] DOB: 1961-03-13 DOA: 06/24/2022 PCP: [facility]   Brief Narrative:   62 y.o. male with medical history significant of AAA s/p endovascular repair (12/2021), HTN, PAD, Cirrhosis, polysubstance abuse (ETOH, cocaine, THC) , PTSD, MDD, HLD, bipolar disorder, recurrent alcoholic pancreatitis presented with worsening abdominal pain.  On presentation, CTA chest/abdomen/pelvis obtained showed similar possibility of choledocholithiasis/pancreatic duct stone.  Lipase was 1366; LFTs and total bilirubin were normal.  He was started on IV fluids and analgesics.  Assessment & Plan:   Acute alcoholic pancreatitis Question of choledocholithiasis/pancreatic duct stone -CTA chest/abdomen/pelvis obtained showed similar possibility of choledocholithiasis/pancreatic duct stone as seen in last admission in December but this was ruled out last time with MRCP.  Will consult GI -LFTs and total bilirubin normal on presentation.  Repeat a.m. LFTs. -Continue IV fluids and analgesics.  Currently on full liquid diet and tolerating. -Counseled regarding abstinence from alcohol  Alcohol abuse -Monitor for withdrawal.  CIWA protocol.  Continue thiamine, multivitamin, folic acid.  TOC consult  Hyponatremia -Mild.  Continue IV fluids.  Monitor.  Thoracic ascending aortic aneurysm - 4 cm aneurysm noted on CTA chest.  Recommend annual imaging with CTA or MRA. Follows with the [place].   Hypertension -Blood pressure improving.  Continue amlodipine and labetalol along with as needed hydralazine  Infrarenal abdominal aortic aneurysm without rupture -S/p endovascular repair on 12/2021. CTA A/P on presentation showed stable graft stent.  Outpatient follow-up with vascular surgery  Polysubstance abuse -Continued use of cocaine and tetrahydrocannabinol.  Check urine drug screen.  TOC consult.  Counseled regarding cessation     DVT prophylaxis: Lovenox Code Status:  Full Family Communication: None at bedside Disposition Plan: Status is: Observation The patient will require care spanning > 2 midnights and should be moved to inpatient because: Of severity of illness.  Need for IV fluids and analgesics.    Consultants: Consult GI  Procedures: None  Antimicrobials: None   Subjective: Patient seen and examined at bedside.  Still complains of intermittent abdominal pain with nausea but feels better since admission.  Not currently vomiting.  Tolerating liquid diet.  No fever or chest pain reported.  Objective: Vitals:   06/25/22 0145 06/25/22 0411 06/25/22 0700 06/25/22 0736  BP: (!) 132/97 (!) 139/104 124/86 123/84  Pulse: 78 83 73 77  Resp: 12 (!) 22 20 (!) 23  Temp:  98.1 F (36.7 C)  98.4 F (36.9 C)  TempSrc:  Oral  Oral  SpO2: 94% 97% 98% 97%  Weight:      Height:       No intake or output data in the 24 hours ending 06/25/22 0945 Filed Weights   06/24/22 1703  Weight: 68 kg    Examination:  General exam: Appears calm and comfortable.  Looks chronically ill and deconditioned.  On room air.   Respiratory system: Bilateral decreased breath sounds at bases with scattered crackles with intermittent tachypnea Cardiovascular system: S1 & S2 heard, Rate controlled Gastrointestinal system: Abdomen is distended, soft and mildly tender in the upper quadrant. Normal bowel sounds heard. Extremities: No cyanosis, clubbing, edema  Central nervous system: Alert and oriented. No focal neurological deficits. Moving extremities Skin: No rashes, lesions or ulcers Psychiatry: Flat affect.  No signs of agitation.    Data Reviewed: I have personally reviewed following labs and imaging studies  CBC: Recent Labs  Lab 06/24/22 1659 06/25/22 0400  WBC 7.8 6.9  NEUTROABS 5.8  --  HGB 14.8 13.4  HCT 43.8 39.5  MCV 89.9 89.8  PLT 237 [id number]   Basic Metabolic Panel: Recent Labs  Lab 06/24/22 1659 06/25/22 0400  NA 137 134*  K 3.7 3.5  CL  101 104  CO2 21* 20*  GLUCOSE 121* 116*  BUN 5* <5*  CREATININE 0.92 0.80  CALCIUM 9.7 8.7*   GFR: Estimated Creatinine Clearance: 94.4 mL/min (by C-G formula based on SCr of 0.8 mg/dL). Liver Function Tests: Recent Labs  Lab 06/24/22 1659  AST 28  ALT 15  ALKPHOS 84  BILITOT 1.0  PROT 7.4  ALBUMIN 4.3   Recent Labs  Lab 06/24/22 1659  LIPASE 1,366*   No results for input(s): "AMMONIA" in the last 168 hours. Coagulation Profile: No results for input(s): "INR", "PROTIME" in the last 168 hours. Cardiac Enzymes: No results for input(s): "CKTOTAL", "CKMB", "CKMBINDEX", "TROPONINI" in the last 168 hours. BNP (last 3 results) No results for input(s): "PROBNP" in the last 8760 hours. HbA1C: No results for input(s): "HGBA1C" in the last 72 hours. CBG: No results for input(s): "GLUCAP" in the last 168 hours. Lipid Profile: No results for input(s): "CHOL", "HDL", "LDLCALC", "TRIG", "CHOLHDL", "LDLDIRECT" in the last 72 hours. Thyroid Function Tests: No results for input(s): "TSH", "T4TOTAL", "FREET4", "T3FREE", "THYROIDAB" in the last 72 hours. Anemia Panel: No results for input(s): "VITAMINB12", "FOLATE", "FERRITIN", "TIBC", "IRON", "RETICCTPCT" in the last 72 hours. Sepsis Labs: No results for input(s): "PROCALCITON", "LATICACIDVEN" in the last 168 hours.  No results found for this or any previous visit (from the past 240 hour(s)).       Radiology Studies: CT Angio Chest/Abd/Pel for Dissection W and/or Wo Contrast  Result Date: 06/24/2022 CLINICAL DATA:  Chest pain. EXAM: CT ANGIOGRAPHY CHEST, ABDOMEN AND PELVIS TECHNIQUE: Multidetector CT imaging through the chest, abdomen and pelvis was performed using the standard protocol during bolus administration of intravenous contrast. Multiplanar reconstructed images and MIPs were obtained and reviewed to evaluate the vascular anatomy. RADIATION DOSE REDUCTION: This exam was performed according to the departmental  dose-optimization program which includes automated exposure control, adjustment of the mA and/or kV according to patient size and/or use of iterative reconstruction technique. CONTRAST:  84m OMNIPAQUE IOHEXOL 350 MG/ML SOLN COMPARISON:  05/06/2022. FINDINGS: CTA CHEST FINDINGS Cardiovascular: Ascending thoracic aorta dilated to 4 cm. No aortic dissection. Mild aortic atherosclerosis. No penetrating ulcer. No mural hematoma. Aortic arch branch vessels are widely patent. Heart normal in size and configuration. Mild left coronary artery calcifications. No pericardial effusion. Mediastinum/Nodes: No enlarged mediastinal, hilar, or axillary lymph nodes. Thyroid gland, trachea, and esophagus demonstrate no significant findings. Lungs/Pleura: Clear lungs. Few small paraseptal emphysematous cysts, medial left upper lobe. No pleural effusion. No pneumothorax. Musculoskeletal: No fracture or acute finding. No bone lesion. Mild thoracic spine disc degenerative changes. Review of the MIP images confirms the above findings. CTA ABDOMEN AND PELVIS FINDINGS VASCULAR Aorta: Previous stenting of an infrarenal abdominal aortic aneurysm. Native aneurysm measures 4.9 cm. No contrast enhancement outside of the aortic stent to suggest an endoleak. Aortic lumen widely patent. No dissection. Celiac: Patent without evidence of aneurysm, dissection, vasculitis or significant stenosis. SMA: Patent without evidence of aneurysm, dissection, vasculitis or significant stenosis. Renals: Both renal arteries are patent without evidence of aneurysm, dissection, vasculitis, fibromuscular dysplasia or significant stenosis. IMA: Occluded. Inflow: Widely patent common iliac artery stents. Veins: No obvious venous abnormality within the limitations of this arterial phase study. Review of the MIP images confirms the above findings. NON-VASCULAR Hepatobiliary:  No focal liver abnormality is seen. No gallstones, gallbladder [place] thickening, or biliary  dilatation. Pancreas: No pancreatic mass. Pancreatic duct dilation to 6-7 mm. Small stone projects near the ampullary Vater, appearing to reside in the distal common bile duct. However, this is unchanged from the prior CT and is more likely within the adjacent pancreatic head. There is fluid attenuation within the fat surrounding the pancreas. Spleen: Normal in size without focal abnormality. Adrenals/Urinary Tract: No adrenal mass. Kidneys normal in size, orientation and position with symmetric enhancement. No renal mass. No convincing stone. No hydronephrosis. Ureters not well visualized but grossly normal in course and in caliber. Bladder unremarkable. Stomach/Bowel: Normal stomach. Small bowel and colon are normal in caliber. No Reber thickening or convincing inflammation. Lymphatic: No discrete enlarged lymph nodes. Reproductive: Unremarkable. Other: Venous collaterals noted in the upper abdomen adjacent to the spleen and pancreatic tail. Musculoskeletal: No fracture or acute finding.  No bone lesion. Review of the MIP images confirms the above findings. IMPRESSION: CTA FINDINGS 1. No aortic dissection.  No evidence of acute aortic syndrome. 2. 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 Recommend annual imaging followup by CTA or MRA. This recommendation follows 2010 ACCF/AHA/AATS/ACR/ASA/SCA/SCAI/SIR/STS/SVM Guidelines for the Diagnosis and Management of Patients with Thoracic Aortic Disease. Circulation. 2010; [id number]. Aortic aneurysm NOS (ICD10-I71.9) 3. Previous repair of an infrarenal abdominal aortic aneurysm. Appear stable from the prior CT. NON CTA FINDINGS 1. Small stone projects near the ampulla Vater possibly within the distal common bile duct or pancreatic duct, unchanged when compared to the prior CT. Stability since the prior CT argues against a common bile duct stone. Pancreatic duct dilation and peripancreatic fluid attenuation is similar to the prior CT, but again is  suspicious for pancreatitis if there are consistent clinical findings. Current arterial exam is suboptimal for assessment of the pancreas. 2. No other evidence of an acute abnormality within the abdomen or pelvis. No evidence of an acute abnormality in the chest. Electronically Signed   By: Lajean Manes M.D.   On: 06/24/2022 19:18        Scheduled Meds:  amLODipine  10 mg Oral Daily   aspirin EC  81 mg Oral Daily   enoxaparin (LOVENOX) injection  40 mg Subcutaneous [id number]   folic acid  1 mg Oral Daily   labetalol  100 mg Oral BID   multivitamin with minerals  1 tablet Oral Daily   ondansetron (ZOFRAN) IV  4 mg Intravenous Once   thiamine  100 mg Oral Daily   Or   thiamine  100 mg Intravenous Daily   Continuous Infusions:  sodium chloride 150 mL/hr at 06/24/22 2153          Aline August, MD [facility] 06/25/2022, 9:45 AM

## 2022-06-25 NOTE — ED Notes (Signed)
ED TO INPATIENT HANDOFF REPORT  ED Nurse Name and Phone #: [phone]  S Name/Age/Gender Angel Golden 62 y.o. male Room/Bed: 037C/037C  Code Status   Code Status: Full Code  Home/SNF/Other Home Patient oriented to: self, place, time, and situation Is this baseline? Yes   Triage Complete: Triage complete  Chief Complaint Acute pancreatitis [K85.90]  Triage Note Pt arrived via [facility] from home reporting 7/10 center chest pain, no radiation, with nausea and vomiting starting at 9 am today, significantly worsening 30 minutes ago. Pt reports significant pressure in chest feeling similar to event leading up to stent placement in Novemeber 2023. EMS administered 324 ASA, 1 SL Nitro with no improvement, and 55m Zofran via 18g LAC.   BP 190/120 HR 120 SPO2 92% RR 28 CBG 130   Allergies No Known Allergies  Level of Care/Admitting Diagnosis ED Disposition     ED Disposition  Admit   Condition  --   Comment  [id number]  Level of Care: Telemetry Medical [104]  [facility] of care is available:: No  Covid Evaluation: Asymptomatic - no recent exposure (last 10 days) testing not required  Diagnosis: Acute pancreatitis [577.0.ICD-9-CM]  Admitting Physician: TOrene Desanctis[[id number] Attending Physician: TOrene Desanctis[[id number]         B Medical/Surgery History Past Medical History:  Diagnosis Date   Bone spur of other site    rt foot   [place]    Hepatitis C    Hypertension    PTSD (post-traumatic stress disorder)    Past Surgical History:  Procedure Laterality Date   ABDOMINAL AORTIC ENDOVASCULAR STENT GRAFT N/A 12/28/2021   Procedure: ABDOMINAL AORTIC ENDOVASCULAR STENT GRAFT;  Surgeon: RBroadus John MD;  [facility]  Service: Vascular;  Laterality: N/A;   HAND SURGERY Right    HAND SURGERY Left      A IV  Location/Drains/Wounds Patient Lines/Drains/Airways Status     Active Line/Drains/Airways     Name Placement date Placement time Site Days   Peripheral IV 06/24/22 18 G Anterior;Left;Proximal Forearm 06/24/22  1705  Forearm  1            Intake/Output Last 24 hours  Intake/Output Summary (Last 24 hours) at 06/25/2022 1047 Last data filed at 06/25/2022 1013 Gross per 24 hour  Intake --  Output 375 ml  Net -375 ml    Labs/Imaging Results for orders placed or performed during the hospital encounter of 06/24/22 (from the past 48 hour(s))  CBC with Differential     Status: None   Collection Time: 06/24/22  4:59 PM  Result Value Ref Range   WBC 7.8 4.0 - 10.5 K/uL   RBC 4.87 4.22 - 5.81 MIL/uL   Hemoglobin 14.8 13.0 - 17.0 g/dL   HCT 43.8 39.0 - 52.0 %   MCV 89.9 80.0 - 100.0 fL   MCH 30.4 26.0 - 34.0 pg   MCHC 33.8 30.0 - 36.0 g/dL   RDW 13.8 11.5 - 15.5 %   Platelets 237 150 - 400 K/uL   nRBC 0.0 0.0 - 0.2 %   Neutrophils Relative % 75 %   Neutro Abs 5.8 1.7 - 7.7 K/uL   Lymphocytes Relative 19 %   Lymphs Abs 1.5 0.7 - 4.0 K/uL   Monocytes Relative 5 %   Monocytes Absolute 0.4 0.1 - 1.0 K/uL   Eosinophils  Relative 1 %   Eosinophils Absolute 0.1 0.0 - 0.5 K/uL   Basophils Relative 0 %   Basophils Absolute 0.0 0.0 - 0.1 K/uL   Immature Granulocytes 0 %   Abs Immature Granulocytes 0.02 0.00 - 0.07 K/uL    Comment: Performed at [facility] [address], [place], [place] [address]  Comprehensive metabolic panel     Status: Abnormal   Collection Time: 06/24/22  4:59 PM  Result Value Ref Range   Sodium 137 135 - 145 mmol/L   Potassium 3.7 3.5 - 5.1 mmol/L   Chloride 101 98 - 111 mmol/L   CO2 21 (L) 22 - 32 mmol/L   Glucose, Bld 121 (H) 70 - 99 mg/dL    Comment: Glucose reference range applies only to samples taken after fasting for at least 8 hours.   BUN 5 (L) 6 - 20 mg/dL   Creatinine, Ser 0.92 0.61 - 1.24 mg/dL   Calcium 9.7 8.9 - 10.3 mg/dL   Total  Protein 7.4 6.5 - 8.1 g/dL   Albumin 4.3 3.5 - 5.0 g/dL   AST 28 15 - 41 U/L   ALT 15 0 - 44 U/L   Alkaline Phosphatase 84 38 - 126 U/L   Total Bilirubin 1.0 0.3 - 1.2 mg/dL   GFR, Estimated >60 >60 mL/min    Comment: (NOTE) Calculated using the CKD-EPI Creatinine Equation (2021)    Anion gap 15 5 - 15    Comment: Performed at [facility] [address], [place], [place] [address]  Lipase, blood     Status: Abnormal   Collection Time: 06/24/22  4:59 PM  Result Value Ref Range   Lipase 1,366 (H) 11 - 51 U/L    Comment: RESULTS CONFIRMED BY MANUAL DILUTION Performed at [facility], [place] [address] [place], [place] [address]   Troponin I (High Sensitivity)     Status: None   Collection Time: 06/24/22  4:59 PM  Result Value Ref Range   Troponin I (High Sensitivity) 13 <18 ng/L    Comment: (NOTE) Elevated high sensitivity troponin I (hsTnI) values and significant  changes across serial measurements may suggest ACS but many other  chronic and acute conditions are known to elevate hsTnI results.  Refer to the "Links" section for chest pain algorithms and additional  guidance. Performed at [facility], [place] [address] [place], [place] [address]   Troponin I (High Sensitivity)     Status: None   Collection Time: 06/24/22  6:59 PM  Result Value Ref Range   Troponin I (High Sensitivity) 15 <18 ng/L    Comment: (NOTE) Elevated high sensitivity troponin I (hsTnI) values and significant  changes across serial measurements may suggest ACS but many other  chronic and acute conditions are known to elevate hsTnI results.  Refer to the "Links" section for chest pain algorithms and additional  guidance. Performed at [facility], [place] [address], [place] [address]   CBC     Status: None   Collection Time: 06/25/22  4:00 AM  Result Value Ref Range   WBC 6.9 4.0 - 10.5 K/uL   RBC 4.40 4.22 - 5.81 MIL/uL   Hemoglobin 13.4 13.0 - 17.0 g/dL   HCT 39.5  39.0 - 52.0 %   MCV 89.8 80.0 - 100.0 fL   MCH 30.5 26.0 - 34.0 pg   MCHC 33.9 30.0 - 36.0 g/dL   RDW 14.0 11.5 -  15.5 %   Platelets 194 150 - 400 K/uL   nRBC 0.0 0.0 - 0.2 %    Comment: Performed at [facility], [place] [address], [place], [place] [id number]  Basic metabolic panel     Status: Abnormal   Collection Time: 06/25/22  4:00 AM  Result Value Ref Range   Sodium 134 (L) 135 - 145 mmol/L   Potassium 3.5 3.5 - 5.1 mmol/L   Chloride 104 98 - 111 mmol/L   CO2 20 (L) 22 - 32 mmol/L   Glucose, Bld 116 (H) 70 - 99 mg/dL    Comment: Glucose reference range applies only to samples taken after fasting for at least 8 hours.   BUN <5 (L) 6 - 20 mg/dL   Creatinine, Ser 0.80 0.61 - 1.24 mg/dL   Calcium 8.7 (L) 8.9 - 10.3 mg/dL   GFR, Estimated >60 >60 mL/min    Comment: (NOTE) Calculated using the CKD-EPI Creatinine Equation (2021)    Anion gap 10 5 - 15    Comment: Performed at [facility] [address] [place], [place] [address]   CT Angio Chest/Abd/Pel for Dissection W and/or Wo Contrast  Result Date: 06/24/2022 CLINICAL DATA:  Chest pain. EXAM: CT ANGIOGRAPHY CHEST, ABDOMEN AND PELVIS TECHNIQUE: Multidetector CT imaging through the chest, abdomen and pelvis was performed using the standard protocol during bolus administration of intravenous contrast. Multiplanar reconstructed images and MIPs were obtained and reviewed to evaluate the vascular anatomy. RADIATION DOSE REDUCTION: This exam was performed according to the departmental dose-optimization program which includes automated exposure control, adjustment of the mA and/or kV according to patient size and/or use of iterative reconstruction technique. CONTRAST:  24m OMNIPAQUE IOHEXOL 350 MG/ML SOLN COMPARISON:  05/06/2022. FINDINGS: CTA CHEST FINDINGS Cardiovascular: Ascending thoracic aorta dilated to 4 cm. No aortic dissection. Mild aortic atherosclerosis. No penetrating ulcer. No mural hematoma. Aortic arch branch  vessels are widely patent. Heart normal in size and configuration. Mild left coronary artery calcifications. No pericardial effusion. Mediastinum/Nodes: No enlarged mediastinal, hilar, or axillary lymph nodes. Thyroid gland, trachea, and esophagus demonstrate no significant findings. Lungs/Pleura: Clear lungs. Few small paraseptal emphysematous cysts, medial left upper lobe. No pleural effusion. No pneumothorax. Musculoskeletal: No fracture or acute finding. No bone lesion. Mild thoracic spine disc degenerative changes. Review of the MIP images confirms the above findings. CTA ABDOMEN AND PELVIS FINDINGS VASCULAR Aorta: Previous stenting of an infrarenal abdominal aortic aneurysm. Native aneurysm measures 4.9 cm. No contrast enhancement outside of the aortic stent to suggest an endoleak. Aortic lumen widely patent. No dissection. Celiac: Patent without evidence of aneurysm, dissection, vasculitis or significant stenosis. SMA: Patent without evidence of aneurysm, dissection, vasculitis or significant stenosis. Renals: Both renal arteries are patent without evidence of aneurysm, dissection, vasculitis, fibromuscular dysplasia or significant stenosis. IMA: Occluded. Inflow: Widely patent common iliac artery stents. Veins: No obvious venous abnormality within the limitations of this arterial phase study. Review of the MIP images confirms the above findings. NON-VASCULAR Hepatobiliary: No focal liver abnormality is seen. No gallstones, gallbladder Schwegler thickening, or biliary dilatation. Pancreas: No pancreatic mass. Pancreatic duct dilation to 6-7 mm. Small stone projects near the ampullary Vater, appearing to reside in the distal common bile duct. However, this is unchanged from the prior CT and is more likely within the adjacent pancreatic head. There is fluid attenuation within the fat surrounding the pancreas. Spleen: Normal in size without focal abnormality. Adrenals/Urinary Tract: No adrenal mass. Kidneys normal in  size,  orientation and position with symmetric enhancement. No renal mass. No convincing stone. No hydronephrosis. Ureters not well visualized but grossly normal in course and in caliber. Bladder unremarkable. Stomach/Bowel: Normal stomach. Small bowel and colon are normal in caliber. No [place] thickening or convincing inflammation. Lymphatic: No discrete enlarged lymph nodes. Reproductive: Unremarkable. Other: Venous collaterals noted in the upper abdomen adjacent to the spleen and pancreatic tail. Musculoskeletal: No fracture or acute finding.  No bone lesion. Review of the MIP images confirms the above findings. IMPRESSION: CTA FINDINGS 1. No aortic dissection.  No evidence of acute aortic syndrome. 2. 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 Recommend annual imaging followup by CTA or MRA. This recommendation follows 2010 ACCF/AHA/AATS/ACR/ASA/SCA/SCAI/SIR/STS/SVM Guidelines for the Diagnosis and Management of Patients with Thoracic Aortic Disease. Circulation. 2010; 121ML:4928372. Aortic aneurysm NOS (ICD10-I71.9) 3. Previous repair of an infrarenal abdominal aortic aneurysm. Appear stable from the prior CT. NON CTA FINDINGS 1. Small stone projects near the ampulla Vater possibly within the distal common bile duct or pancreatic duct, unchanged when compared to the prior CT. Stability since the prior CT argues against a common bile duct stone. Pancreatic duct dilation and peripancreatic fluid attenuation is similar to the prior CT, but again is suspicious for pancreatitis if there are consistent clinical findings. Current arterial exam is suboptimal for assessment of the pancreas. 2. No other evidence of an acute abnormality within the abdomen or pelvis. No evidence of an acute abnormality in the chest. Electronically Signed   By: Lajean Manes M.D.   On: 06/24/2022 19:18    Pending Labs Unresulted Labs (From admission, onward)     Start     Ordered   06/26/22 0500  CBC with  Differential/Platelet  Tomorrow morning,   R        06/25/22 0952   06/26/22 0500  Comprehensive metabolic panel  Tomorrow morning,   R        06/25/22 0952   06/26/22 0500  Magnesium  Tomorrow morning,   R        06/25/22 0952            Vitals/Pain Today's Vitals   06/25/22 0544 06/25/22 0700 06/25/22 0729 06/25/22 0736  BP:  124/86  123/84  Pulse:  73  77  Resp:  20  (!) 23  Temp:    98.4 F (36.9 C)  TempSrc:    Oral  SpO2:  98%  97%  Weight:      Height:      PainSc: 0-No pain  10-Worst pain ever     Isolation Precautions No active isolations  Medications Medications  ondansetron (ZOFRAN) injection 4 mg (0 mg Intravenous Hold 06/24/22 1716)  enoxaparin (LOVENOX) injection 40 mg (40 mg Subcutaneous Given 06/25/22 1006)  morphine (PF) 2 MG/ML injection 2 mg (has no administration in time range)  HYDROmorphone (DILAUDID) injection 0.5 mg (0.5 mg Intravenous Given 06/25/22 0345)  0.9 %  sodium chloride infusion ( Intravenous New Bag/Given 06/24/22 2153)  ondansetron (ZOFRAN) injection 4 mg (has no administration in time range)  LORazepam (ATIVAN) tablet 1-4 mg (has no administration in time range)  thiamine (VITAMIN B1) tablet 100 mg (100 mg Oral Given 06/25/22 1004)    Or  thiamine (VITAMIN B1) injection 100 mg ( Intravenous See Alternative [id number])  folic acid (FOLVITE) tablet 1 mg (1 mg Oral Given 06/25/22 1004)  multivitamin with minerals tablet 1 tablet (1 tablet Oral Given 06/25/22 1004)  amLODipine (  NORVASC) tablet 10 mg (10 mg Oral Given 06/25/22 1005)  aspirin EC tablet 81 mg (81 mg Oral Given 06/25/22 1005)  labetalol (NORMODYNE) tablet 100 mg (100 mg Oral Given 06/25/22 1004)  hydrALAZINE (APRESOLINE) injection 10 mg (has no administration in time range)  hydrALAZINE (APRESOLINE) injection 10 mg (10 mg Intravenous Given 06/24/22 1742)  HYDROmorphone (DILAUDID) injection 1 mg (1 mg Intravenous Given 06/24/22 1715)  iohexol (OMNIPAQUE) 350 MG/ML injection 75 mL  (75 mLs Intravenous Contrast Given 06/24/22 1853)  sodium chloride 0.9 % bolus 1,000 mL (0 mLs Intravenous Stopped 06/24/22 2129)    Mobility walks     Focused Assessments Cardiac Assessment Handoff:    No results found for: "CKTOTAL", "CKMB", "CKMBINDEX", "TROPONINI" No results found for: "DDIMER" Does the Patient currently have chest pain? No    R Recommendations: See Admitting Provider Note  Report given to:   Additional Notes: Pt is A & O x 4 and ambulatory. Diagnose him with acute alcoholic pancreatitis. The patient is not having any chest pain or nausea. He his CIWA every 4 hours

## 2022-06-25 NOTE — Plan of Care (Signed)

## 2022-06-26 DIAGNOSIS — I7121 Aneurysm of the ascending aorta, without rupture: Secondary | ICD-10-CM | POA: Diagnosis not present

## 2022-06-26 DIAGNOSIS — F1010 Alcohol abuse, uncomplicated: Secondary | ICD-10-CM | POA: Diagnosis not present

## 2022-06-26 DIAGNOSIS — K852 Alcohol induced acute pancreatitis without necrosis or infection: Secondary | ICD-10-CM | POA: Diagnosis not present

## 2022-06-26 DIAGNOSIS — I7143 Infrarenal abdominal aortic aneurysm, without rupture: Secondary | ICD-10-CM | POA: Diagnosis not present

## 2022-06-26 LAB — CBC WITH DIFFERENTIAL/PLATELET
Abs Immature Granulocytes: 0.01 10*3/uL (ref 0.00–0.07)
Basophils Absolute: 0 10*3/uL (ref 0.0–0.1)
Basophils Relative: 0 %
Eosinophils Absolute: 0.1 10*3/uL (ref 0.0–0.5)
Eosinophils Relative: 2 %
HCT: 37.9 % — ABNORMAL LOW (ref 39.0–52.0)
Hemoglobin: 12.9 g/dL — ABNORMAL LOW (ref 13.0–17.0)
Immature Granulocytes: 0 %
Lymphocytes Relative: 43 %
Lymphs Abs: 2.3 10*3/uL (ref 0.7–4.0)
MCH: 30.7 pg (ref 26.0–34.0)
MCHC: 34 g/dL (ref 30.0–36.0)
MCV: 90.2 fL (ref 80.0–100.0)
Monocytes Absolute: 0.4 10*3/uL (ref 0.1–1.0)
Monocytes Relative: 7 %
Neutro Abs: 2.6 10*3/uL (ref 1.7–7.7)
Neutrophils Relative %: 48 %
Platelets: 185 10*3/uL (ref 150–400)
RBC: 4.2 MIL/uL — ABNORMAL LOW (ref 4.22–5.81)
RDW: 14.1 % (ref 11.5–15.5)
WBC: 5.4 10*3/uL (ref 4.0–10.5)
nRBC: 0 % (ref 0.0–0.2)

## 2022-06-26 LAB — COMPREHENSIVE METABOLIC PANEL
ALT: 12 U/L (ref 0–44)
AST: 16 U/L (ref 15–41)
Albumin: 3.4 g/dL — ABNORMAL LOW (ref 3.5–5.0)
Alkaline Phosphatase: 64 U/L (ref 38–126)
Anion gap: 8 (ref 5–15)
BUN: <5 mg/dL — ABNORMAL LOW (ref 6–20)
CO2: 24 mmol/L (ref 22–32)
Calcium: 9 mg/dL (ref 8.9–10.3)
Chloride: 105 mmol/L (ref 98–111)
Creatinine, Ser: 0.97 mg/dL (ref 0.61–1.24)
GFR, Estimated: >60 mL/min (ref >60)
Glucose, Bld: 90 mg/dL (ref 70–99)
Potassium: 3.6 mmol/L (ref 3.5–5.1)
Sodium: 137 mmol/L (ref 135–145)
Total Bilirubin: 1 mg/dL (ref 0.3–1.2)
Total Protein: 5.8 g/dL — ABNORMAL LOW (ref 6.5–8.1)

## 2022-06-26 LAB — MAGNESIUM: Magnesium: 1.8 mg/dL (ref 1.7–2.4)

## 2022-06-26 MED ORDER — SODIUM CHLORIDE 0.9 % IV SOLN: INTRAVENOUS | Status: DC

## 2022-06-26 MED ORDER — OXYCODONE HCL 5 MG PO TABS
5.0000 mg | ORAL_TABLET | ORAL | Status: DC | PRN
  Administered 2022-06-26: 5 mg via ORAL
  Filled 2022-06-26: qty 1

## 2022-06-26 NOTE — Discharge Planning (Signed)
Pt active at [facility] Provider: Thornton Dales: [place] phone: [phone]  Please call [facility] transfer coordinator for additional information [phone]

## 2022-06-26 NOTE — Progress Notes (Signed)
PROGRESS NOTE    Angel Golden  [id number] DOB: 12-19-1960 DOA: 06/24/2022 PCP: [facility]   Brief Narrative:   62 y.o. male with medical history significant of AAA s/p endovascular repair (12/2021), HTN, PAD, Cirrhosis, polysubstance abuse (ETOH, cocaine, THC) , PTSD, MDD, HLD, bipolar disorder, recurrent alcoholic pancreatitis presented with worsening abdominal pain.  On presentation, CTA chest/abdomen/pelvis obtained showed similar possibility of choledocholithiasis/pancreatic duct stone.  Lipase was 1366; LFTs and total bilirubin were normal.  He was started on IV fluids and analgesics.  Assessment & Plan:   Acute alcoholic pancreatitis Question of choledocholithiasis/pancreatic duct stone -CTA chest/abdomen/pelvis obtained showed similar possibility of choledocholithiasis/pancreatic duct stone as seen in last admission in December but this was ruled out last time with MRCP.  GI evaluation appreciated: GI signed off on 06/25/2022 and recommended no need for repeat MRCP -LFTs and total bilirubin normal on presentation.  LFTs remain normal this morning too. -Continue IV fluids and analgesics.  Currently on clear liquid diet.  Advance diet to full liquid diet today. -Counseled regarding abstinence from alcohol  Alcohol abuse -Monitor for withdrawal.  CIWA protocol.  Continue thiamine, multivitamin, folic acid.  TOC consult  Hyponatremia -Improved  Thoracic ascending aortic aneurysm - 4 cm aneurysm noted on CTA chest.  Recommend annual imaging with CTA or MRA. Follows with the [place].   Hypertension -Blood pressure stable.  Continue amlodipine and labetalol   Infrarenal abdominal aortic aneurysm without rupture -S/p endovascular repair on 12/2021. CTA A/P on presentation showed stable graft stent.  Outpatient follow-up with vascular surgery  Polysubstance abuse -Continued use of cocaine and tetrahydrocannabinol.  Check urine drug screen.  TOC consult.  Counseled regarding  cessation     DVT prophylaxis: Lovenox Code Status: Full Family Communication: None at bedside Disposition Plan: Status is: inpatient because: Of severity of illness.  Need for IV fluids and analgesics.    Consultants: GI  Procedures: None  Antimicrobials: None   Subjective: Patient seen and examined at bedside.  Complains of intermittent abdominal pain.  No fever, chest pain or shortness of breath reported. Objective: Vitals:   06/26/22 0353 06/26/22 0400 06/26/22 0600 06/26/22 0800  BP: 120/72 121/70  127/80  Pulse: 67 65 72 68  Resp: 14 14 (!) 21 12  Temp: 98.8 F (37.1 C)   97.8 F (36.6 C)  TempSrc: Oral   Oral  SpO2: 96% 96% 96% 96%  Weight:      Height:        Intake/Output Summary (Last 24 hours) at 06/26/2022 [id number] Last data filed at 06/25/2022 2000 Gross per 24 hour  Intake --  Output 1425 ml  Net -1425 ml   Filed Weights   06/24/22 1703  Weight: 68 kg    Examination:  General: On room air.  No distress.  respiratory: Decreased breath sounds at bases bilaterally with some crackles CVS: Currently rate controlled; S1-S2 heard  abdominal: Soft, tender in the upper quadrant, distended mildly, no organomegaly; bowel sounds are heard  extremities: Trace lower extremity edema; no clubbing.       Data Reviewed: I have personally reviewed following labs and imaging studies  CBC: Recent Labs  Lab 06/24/22 1659 06/25/22 0400 06/26/22 0250  WBC 7.8 6.9 5.4  NEUTROABS 5.8  --  2.6  HGB 14.8 13.4 12.9*  HCT 43.8 39.5 37.9*  MCV 89.9 89.8 90.2  PLT 237 194 [id number]    Basic Metabolic Panel: Recent Labs  Lab 06/24/22 1659 06/25/22 0400 06/26/22 0250  NA 137 134* 137  K 3.7 3.5 3.6  CL 101 104 105  CO2 21* 20* 24  GLUCOSE 121* 116* 90  BUN 5* <5* <5*  CREATININE 0.92 0.80 0.97  CALCIUM 9.7 8.7* 9.0  MG  --   --  1.8    GFR: Estimated Creatinine Clearance: 77.9 mL/min (by C-G formula based on SCr of 0.97 mg/dL). Liver Function  Tests: Recent Labs  Lab 06/24/22 1659 06/26/22 0250  AST 28 16  ALT 15 12  ALKPHOS 84 64  BILITOT 1.0 1.0  PROT 7.4 5.8*  ALBUMIN 4.3 3.4*    Recent Labs  Lab 06/24/22 1659  LIPASE 1,366*    No results for input(s): "AMMONIA" in the last 168 hours. Coagulation Profile: No results for input(s): "INR", "PROTIME" in the last 168 hours. Cardiac Enzymes: No results for input(s): "CKTOTAL", "CKMB", "CKMBINDEX", "TROPONINI" in the last 168 hours. BNP (last 3 results) No results for input(s): "PROBNP" in the last 8760 hours. HbA1C: No results for input(s): "HGBA1C" in the last 72 hours. CBG: No results for input(s): "GLUCAP" in the last 168 hours. Lipid Profile: No results for input(s): "CHOL", "HDL", "LDLCALC", "TRIG", "CHOLHDL", "LDLDIRECT" in the last 72 hours. Thyroid Function Tests: No results for input(s): "TSH", "T4TOTAL", "FREET4", "T3FREE", "THYROIDAB" in the last 72 hours. Anemia Panel: No results for input(s): "VITAMINB12", "FOLATE", "FERRITIN", "TIBC", "IRON", "RETICCTPCT" in the last 72 hours. Sepsis Labs: No results for input(s): "PROCALCITON", "LATICACIDVEN" in the last 168 hours.  No results found for this or any previous visit (from the past 240 hour(s)).       Radiology Studies: CT Angio Chest/Abd/Pel for Dissection W and/or Wo Contrast  Result Date: 06/24/2022 CLINICAL DATA:  Chest pain. EXAM: CT ANGIOGRAPHY CHEST, ABDOMEN AND PELVIS TECHNIQUE: Multidetector CT imaging through the chest, abdomen and pelvis was performed using the standard protocol during bolus administration of intravenous contrast. Multiplanar reconstructed images and MIPs were obtained and reviewed to evaluate the vascular anatomy. RADIATION DOSE REDUCTION: This exam was performed according to the departmental dose-optimization program which includes automated exposure control, adjustment of the mA and/or kV according to patient size and/or use of iterative reconstruction technique.  CONTRAST:  19m OMNIPAQUE IOHEXOL 350 MG/ML SOLN COMPARISON:  05/06/2022. FINDINGS: CTA CHEST FINDINGS Cardiovascular: Ascending thoracic aorta dilated to 4 cm. No aortic dissection. Mild aortic atherosclerosis. No penetrating ulcer. No mural hematoma. Aortic arch branch vessels are widely patent. Heart normal in size and configuration. Mild left coronary artery calcifications. No pericardial effusion. Mediastinum/Nodes: No enlarged mediastinal, hilar, or axillary lymph nodes. Thyroid gland, trachea, and esophagus demonstrate no significant findings. Lungs/Pleura: Clear lungs. Few small paraseptal emphysematous cysts, medial left upper lobe. No pleural effusion. No pneumothorax. Musculoskeletal: No fracture or acute finding. No bone lesion. Mild thoracic spine disc degenerative changes. Review of the MIP images confirms the above findings. CTA ABDOMEN AND PELVIS FINDINGS VASCULAR Aorta: Previous stenting of an infrarenal abdominal aortic aneurysm. Native aneurysm measures 4.9 cm. No contrast enhancement outside of the aortic stent to suggest an endoleak. Aortic lumen widely patent. No dissection. Celiac: Patent without evidence of aneurysm, dissection, vasculitis or significant stenosis. SMA: Patent without evidence of aneurysm, dissection, vasculitis or significant stenosis. Renals: Both renal arteries are patent without evidence of aneurysm, dissection, vasculitis, fibromuscular dysplasia or significant stenosis. IMA: Occluded. Inflow: Widely patent common iliac artery stents. Veins: No obvious venous abnormality within the limitations of this arterial phase study. Review of the MIP images confirms the above findings. NON-VASCULAR Hepatobiliary: No  focal liver abnormality is seen. No gallstones, gallbladder Presas thickening, or biliary dilatation. Pancreas: No pancreatic mass. Pancreatic duct dilation to 6-7 mm. Small stone projects near the ampullary Vater, appearing to reside in the distal common bile duct.  However, this is unchanged from the prior CT and is more likely within the adjacent pancreatic head. There is fluid attenuation within the fat surrounding the pancreas. Spleen: Normal in size without focal abnormality. Adrenals/Urinary Tract: No adrenal mass. Kidneys normal in size, orientation and position with symmetric enhancement. No renal mass. No convincing stone. No hydronephrosis. Ureters not well visualized but grossly normal in course and in caliber. Bladder unremarkable. Stomach/Bowel: Normal stomach. Small bowel and colon are normal in caliber. No Buttacavoli thickening or convincing inflammation. Lymphatic: No discrete enlarged lymph nodes. Reproductive: Unremarkable. Other: Venous collaterals noted in the upper abdomen adjacent to the spleen and pancreatic tail. Musculoskeletal: No fracture or acute finding.  No bone lesion. Review of the MIP images confirms the above findings. IMPRESSION: CTA FINDINGS 1. No aortic dissection.  No evidence of acute aortic syndrome. 2. 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 Recommend annual imaging followup by CTA or MRA. This recommendation follows 2010 ACCF/AHA/AATS/ACR/ASA/SCA/SCAI/SIR/STS/SVM Guidelines for the Diagnosis and Management of Patients with Thoracic Aortic Disease. Circulation. 2010; [id number]. Aortic aneurysm NOS (ICD10-I71.9) 3. Previous repair of an infrarenal abdominal aortic aneurysm. Appear stable from the prior CT. NON CTA FINDINGS 1. Small stone projects near the ampulla Vater possibly within the distal common bile duct or pancreatic duct, unchanged when compared to the prior CT. Stability since the prior CT argues against a common bile duct stone. Pancreatic duct dilation and peripancreatic fluid attenuation is similar to the prior CT, but again is suspicious for pancreatitis if there are consistent clinical findings. Current arterial exam is suboptimal for assessment of the pancreas. 2. No other evidence of an acute abnormality  within the abdomen or pelvis. No evidence of an acute abnormality in the chest. Electronically Signed   By: Lajean Manes M.D.   On: 06/24/2022 19:18        Scheduled Meds:  amLODipine  10 mg Oral Daily   aspirin EC  81 mg Oral Daily   enoxaparin (LOVENOX) injection  40 mg Subcutaneous [id number]   folic acid  1 mg Oral Daily   labetalol  100 mg Oral BID   multivitamin with minerals  1 tablet Oral Daily   ondansetron (ZOFRAN) IV  4 mg Intravenous Once   thiamine  100 mg Oral Daily   Or   thiamine  100 mg Intravenous Daily   Continuous Infusions:          Aline August, MD [facility] 06/26/2022, 8:32 AM

## 2022-06-26 NOTE — Plan of Care (Signed)

## 2022-06-27 DIAGNOSIS — I7121 Aneurysm of the ascending aorta, without rupture: Secondary | ICD-10-CM | POA: Diagnosis not present

## 2022-06-27 DIAGNOSIS — F1010 Alcohol abuse, uncomplicated: Secondary | ICD-10-CM | POA: Diagnosis not present

## 2022-06-27 DIAGNOSIS — I7143 Infrarenal abdominal aortic aneurysm, without rupture: Secondary | ICD-10-CM | POA: Diagnosis not present

## 2022-06-27 DIAGNOSIS — F1920 Other psychoactive substance dependence, uncomplicated: Secondary | ICD-10-CM | POA: Diagnosis not present

## 2022-06-27 DIAGNOSIS — K852 Alcohol induced acute pancreatitis without necrosis or infection: Secondary | ICD-10-CM | POA: Diagnosis not present

## 2022-06-27 DIAGNOSIS — R69 Illness, unspecified: Secondary | ICD-10-CM | POA: Diagnosis not present

## 2022-06-27 LAB — CBC WITH DIFFERENTIAL/PLATELET
Abs Immature Granulocytes: 0.02 10*3/uL (ref 0.00–0.07)
Basophils Absolute: 0 10*3/uL (ref 0.0–0.1)
Basophils Relative: 0 %
Eosinophils Absolute: 0.1 10*3/uL (ref 0.0–0.5)
Eosinophils Relative: 2 %
HCT: 36.4 % — ABNORMAL LOW (ref 39.0–52.0)
Hemoglobin: 11.9 g/dL — ABNORMAL LOW (ref 13.0–17.0)
Immature Granulocytes: 0 %
Lymphocytes Relative: 39 %
Lymphs Abs: 2.1 10*3/uL (ref 0.7–4.0)
MCH: 30.1 pg (ref 26.0–34.0)
MCHC: 32.7 g/dL (ref 30.0–36.0)
MCV: 91.9 fL (ref 80.0–100.0)
Monocytes Absolute: 0.6 10*3/uL (ref 0.1–1.0)
Monocytes Relative: 11 %
Neutro Abs: 2.7 10*3/uL (ref 1.7–7.7)
Neutrophils Relative %: 48 %
Platelets: 181 10*3/uL (ref 150–400)
RBC: 3.96 MIL/uL — ABNORMAL LOW (ref 4.22–5.81)
RDW: 13.9 % (ref 11.5–15.5)
WBC: 5.5 10*3/uL (ref 4.0–10.5)
nRBC: 0 % (ref 0.0–0.2)

## 2022-06-27 LAB — COMPREHENSIVE METABOLIC PANEL
ALT: 10 U/L (ref 0–44)
AST: 15 U/L (ref 15–41)
Albumin: 3.2 g/dL — ABNORMAL LOW (ref 3.5–5.0)
Alkaline Phosphatase: 57 U/L (ref 38–126)
Anion gap: 9 (ref 5–15)
BUN: 7 mg/dL (ref 6–20)
CO2: 26 mmol/L (ref 22–32)
Calcium: 9.1 mg/dL (ref 8.9–10.3)
Chloride: 102 mmol/L (ref 98–111)
Creatinine, Ser: 1.12 mg/dL (ref 0.61–1.24)
GFR, Estimated: >60 mL/min (ref >60)
Glucose, Bld: 97 mg/dL (ref 70–99)
Potassium: 3.5 mmol/L (ref 3.5–5.1)
Sodium: 137 mmol/L (ref 135–145)
Total Bilirubin: 0.4 mg/dL (ref 0.3–1.2)
Total Protein: 5.6 g/dL — ABNORMAL LOW (ref 6.5–8.1)

## 2022-06-27 LAB — MAGNESIUM: Magnesium: 1.8 mg/dL (ref 1.7–2.4)

## 2022-06-27 MED ORDER — ADULT MULTIVITAMIN W/MINERALS CH: 1.0000 | ORAL_TABLET | Freq: Every day | ORAL | Status: DC

## 2022-06-27 MED ORDER — VITAMIN B-1 100 MG PO TABS: 100.0000 mg | ORAL_TABLET | Freq: Every day | ORAL | 0 refills | Status: DC

## 2022-06-27 MED ORDER — FOLIC ACID 1 MG PO TABS: 1.0000 mg | ORAL_TABLET | Freq: Every day | ORAL | 0 refills | Status: DC

## 2022-06-27 MED ORDER — ONDANSETRON HCL 4 MG PO TABS: 4.0000 mg | ORAL_TABLET | Freq: Three times a day (TID) | ORAL | 0 refills | Status: DC | PRN

## 2022-06-27 MED ORDER — HYDROCORTISONE (PERIANAL) 2.5 % EX CREA: 1.0000 | TOPICAL_CREAM | Freq: Two times a day (BID) | CUTANEOUS | Status: DC | PRN

## 2022-06-27 NOTE — Discharge Summary (Signed)
Physician Discharge Summary  Angel Golden [id number] DOB: 08-08-1960 DOA: 06/24/2022  PCP: [facility]  Admit date: 06/24/2022 Discharge date: 06/27/2022  Admitted From: Home Disposition: Home  Recommendations for Outpatient Follow-up:  Follow up with PCP in 1 week with repeat CBC/CMP Abstain from alcohol Follow up in ED if symptoms worsen or new appear   Home Health: No Equipment/Devices: None  Discharge Condition: Stable CODE STATUS: Full Diet recommendation: Heart healthy  Brief/Interim Summary: 62 y.o. male with medical history significant of AAA s/p endovascular repair (12/2021), HTN, PAD, Cirrhosis, polysubstance abuse (ETOH, cocaine, THC) , PTSD, MDD, HLD, bipolar disorder, recurrent alcoholic pancreatitis presented with worsening abdominal pain.  On presentation, CTA chest/abdomen/pelvis obtained showed similar possibility of choledocholithiasis/pancreatic duct stone.  Lipase was 1366; LFTs and total bilirubin were normal.  He was started on IV fluids and analgesics.  GI consulted recommended conservative management and signed off.  His condition has improved.  He is tolerating advanced diet.  He feels okay to go home and will be discharged home today with outpatient follow-up with PCP and GI if needed.  Discharge Diagnoses:   Acute alcoholic pancreatitis Question of choledocholithiasis/pancreatic duct stone -CTA chest/abdomen/pelvis obtained showed similar possibility of choledocholithiasis/pancreatic duct stone as seen in last admission in December but this was ruled out last time with MRCP.  GI evaluation appreciated: GI signed off on 06/25/2022 and recommended no need for repeat MRCP -LFTs and total bilirubin normal on presentation.  LFTs have remained normal during this hospitalization. -Tolerating advanced diet. -Counseled regarding abstinence from alcohol -He feels okay to go home and will be discharged home today with outpatient follow-up with PCP and GI if  needed.   Alcohol abuse -No signs of withdrawal.  Continue thiamine, multivitamin, folic acid.  TOC consulted   Hyponatremia -Improved   Thoracic ascending aortic aneurysm - 4 cm aneurysm noted on CTA chest.  Recommend annual imaging with CTA or MRA. Follows with the [place].    Hypertension -Blood pressure stable.  Continue amlodipine and labetalol    Infrarenal abdominal aortic aneurysm without rupture -S/p endovascular repair on 12/2021. CTA A/P on presentation showed stable graft stent.  Outpatient follow-up with vascular surgery   Polysubstance abuse -Continued use of cocaine and tetrahydrocannabinol.  Urine drug screen ordered but pending.  TOC consulted.  Counseled regarding cessation      Discharge Instructions Allergies as of 06/27/2022   No Known Allergies      Medication List     STOP taking these medications    ibuprofen 600 MG tablet Commonly known as: ADVIL       TAKE these medications    amLODipine 10 MG tablet Commonly known as: NORVASC Take 1 tablet (10 mg total) by mouth daily.   aspirin EC 81 MG tablet Take 1 tablet (81 mg total) by mouth daily. Swallow whole.   atorvastatin 40 MG tablet Commonly known as: LIPITOR Take 1 tablet (40 mg total) by mouth daily.   B-12 PO Take 1 tablet by mouth daily.   escitalopram 10 MG tablet Commonly known as: LEXAPRO Take 10 mg by mouth daily.   folic acid 1 MG tablet Commonly known as: FOLVITE Take 1 tablet (1 mg total) by mouth daily. Start taking on: June 28, 2022   hydrocortisone 2.5 % rectal cream Commonly known as: ANUSOL-HC Place 1 Application rectally 2 (two) times daily as needed for hemorrhoids. Patient would like his medicines mailed to him   labetalol 100 MG tablet Commonly known as:  NORMODYNE Take 1 tablet (100 mg total) by mouth 2 (two) times daily.   multivitamin with minerals Tabs tablet Take 1 tablet by mouth daily. Start taking on: June 28, 2022   ondansetron 4 MG  tablet Commonly known as: Zofran Take 1 tablet (4 mg total) by mouth every 8 (eight) hours as needed for nausea or vomiting.   pantoprazole 40 MG tablet Commonly known as: Protonix Take 1 tablet (40 mg total) by mouth daily.   thiamine 100 MG tablet Commonly known as: Vitamin B-1 Take 1 tablet (100 mg total) by mouth daily. Start taking on: June 28, 2022   traZODone 100 MG tablet Commonly known as: DESYREL Take 1 tablet (100 mg total) by mouth at bedtime as needed for sleep.   triamcinolone 0.025 % cream Commonly known as: KENALOG Apply 1 Application topically 2 (two) times daily as needed (rash).   witch hazel-glycerin pad Commonly known as: TUCKS Apply 1 Application topically 4 (four) times daily as needed (irritaiton after bowel movement).          No Known Allergies  Consultations: GI   Procedures/Studies: CT Angio Chest/Abd/Pel for Dissection W and/or Wo Contrast  Result Date: 06/24/2022 CLINICAL DATA:  Chest pain. EXAM: CT ANGIOGRAPHY CHEST, ABDOMEN AND PELVIS TECHNIQUE: Multidetector CT imaging through the chest, abdomen and pelvis was performed using the standard protocol during bolus administration of intravenous contrast. Multiplanar reconstructed images and MIPs were obtained and reviewed to evaluate the vascular anatomy. RADIATION DOSE REDUCTION: This exam was performed according to the departmental dose-optimization program which includes automated exposure control, adjustment of the mA and/or kV according to patient size and/or use of iterative reconstruction technique. CONTRAST:  60m OMNIPAQUE IOHEXOL 350 MG/ML SOLN COMPARISON:  05/06/2022. FINDINGS: CTA CHEST FINDINGS Cardiovascular: Ascending thoracic aorta dilated to 4 cm. No aortic dissection. Mild aortic atherosclerosis. No penetrating ulcer. No mural hematoma. Aortic arch branch vessels are widely patent. Heart normal in size and configuration. Mild left coronary artery calcifications. No pericardial  effusion. Mediastinum/Nodes: No enlarged mediastinal, hilar, or axillary lymph nodes. Thyroid gland, trachea, and esophagus demonstrate no significant findings. Lungs/Pleura: Clear lungs. Few small paraseptal emphysematous cysts, medial left upper lobe. No pleural effusion. No pneumothorax. Musculoskeletal: No fracture or acute finding. No bone lesion. Mild thoracic spine disc degenerative changes. Review of the MIP images confirms the above findings. CTA ABDOMEN AND PELVIS FINDINGS VASCULAR Aorta: Previous stenting of an infrarenal abdominal aortic aneurysm. Native aneurysm measures 4.9 cm. No contrast enhancement outside of the aortic stent to suggest an endoleak. Aortic lumen widely patent. No dissection. Celiac: Patent without evidence of aneurysm, dissection, vasculitis or significant stenosis. SMA: Patent without evidence of aneurysm, dissection, vasculitis or significant stenosis. Renals: Both renal arteries are patent without evidence of aneurysm, dissection, vasculitis, fibromuscular dysplasia or significant stenosis. IMA: Occluded. Inflow: Widely patent common iliac artery stents. Veins: No obvious venous abnormality within the limitations of this arterial phase study. Review of the MIP images confirms the above findings. NON-VASCULAR Hepatobiliary: No focal liver abnormality is seen. No gallstones, gallbladder Santilli thickening, or biliary dilatation. Pancreas: No pancreatic mass. Pancreatic duct dilation to 6-7 mm. Small stone projects near the ampullary Vater, appearing to reside in the distal common bile duct. However, this is unchanged from the prior CT and is more likely within the adjacent pancreatic head. There is fluid attenuation within the fat surrounding the pancreas. Spleen: Normal in size without focal abnormality. Adrenals/Urinary Tract: No adrenal mass. Kidneys normal in size, orientation and position with  symmetric enhancement. No renal mass. No convincing stone. No hydronephrosis. Ureters  not well visualized but grossly normal in course and in caliber. Bladder unremarkable. Stomach/Bowel: Normal stomach. Small bowel and colon are normal in caliber. No Torrens thickening or convincing inflammation. Lymphatic: No discrete enlarged lymph nodes. Reproductive: Unremarkable. Other: Venous collaterals noted in the upper abdomen adjacent to the spleen and pancreatic tail. Musculoskeletal: No fracture or acute finding.  No bone lesion. Review of the MIP images confirms the above findings. IMPRESSION: CTA FINDINGS 1. No aortic dissection.  No evidence of acute aortic syndrome. 2. 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 Recommend annual imaging followup by CTA or MRA. This recommendation follows 2010 ACCF/AHA/AATS/ACR/ASA/SCA/SCAI/SIR/STS/SVM Guidelines for the Diagnosis and Management of Patients with Thoracic Aortic Disease. Circulation. 2010; [id number]. Aortic aneurysm NOS (ICD10-I71.9) 3. Previous repair of an infrarenal abdominal aortic aneurysm. Appear stable from the prior CT. NON CTA FINDINGS 1. Small stone projects near the ampulla Vater possibly within the distal common bile duct or pancreatic duct, unchanged when compared to the prior CT. Stability since the prior CT argues against a common bile duct stone. Pancreatic duct dilation and peripancreatic fluid attenuation is similar to the prior CT, but again is suspicious for pancreatitis if there are consistent clinical findings. Current arterial exam is suboptimal for assessment of the pancreas. 2. No other evidence of an acute abnormality within the abdomen or pelvis. No evidence of an acute abnormality in the chest. Electronically Signed   By: Lajean Manes M.D.   On: 06/24/2022 19:18      Subjective: Patient seen and examined at bedside.  Feels better and wants to go home today.  Denies worsening abdominal nausea, vomiting or fever.  Discharge Exam: Vitals:   06/27/22 0358 06/27/22 0843  BP: 129/71   Pulse:    Resp:     Temp: 98 F (36.7 C) 98.1 F (36.7 C)  SpO2:      General: Pt is alert, awake, not in acute distress.  On room air. Cardiovascular: rate controlled, S1/S2 + Respiratory: bilateral decreased breath sounds at bases Abdominal: Soft, NT, ND, bowel sounds + Extremities: no edema, no cyanosis    The results of significant diagnostics from this hospitalization (including imaging, microbiology, ancillary and laboratory) are listed below for reference.     Microbiology: No results found for this or any previous visit (from the past 240 hour(s)).   Labs: BNP (last 3 results) Recent Labs    12/03/21 1522  BNP [id number]   Basic Metabolic Panel: Recent Labs  Lab 06/24/22 1659 06/25/22 0400 06/26/22 0250 06/27/22 0251  NA 137 134* 137 137  K 3.7 3.5 3.6 3.5  CL 101 104 105 102  CO2 21* 20* 24 26  GLUCOSE 121* 116* 90 97  BUN 5* <5* <5* 7  CREATININE 0.92 0.80 0.97 1.12  CALCIUM 9.7 8.7* 9.0 9.1  MG  --   --  1.8 1.8   Liver Function Tests: Recent Labs  Lab 06/24/22 1659 06/26/22 0250 06/27/22 0251  AST 28 16 15  $ ALT 15 12 10  $ ALKPHOS 84 64 57  BILITOT 1.0 1.0 0.4  PROT 7.4 5.8* 5.6*  ALBUMIN 4.3 3.4* 3.2*   Recent Labs  Lab 06/24/22 1659  LIPASE 1,366*   No results for input(s): "AMMONIA" in the last 168 hours. CBC: Recent Labs  Lab 06/24/22 1659 06/25/22 0400 06/26/22 0250 06/27/22 0251  WBC 7.8 6.9 5.4 5.5  NEUTROABS 5.8  --  2.6 2.7  HGB 14.8 13.4 12.9* 11.9*  HCT 43.8 39.5 37.9* 36.4*  MCV 89.9 89.8 90.2 91.9  PLT 237 194 185 181   Cardiac Enzymes: No results for input(s): "CKTOTAL", "CKMB", "CKMBINDEX", "TROPONINI" in the last 168 hours. BNP: Invalid input(s): "POCBNP" CBG: No results for input(s): "GLUCAP" in the last 168 hours. D-Dimer No results for input(s): "DDIMER" in the last 72 hours. Hgb A1c No results for input(s): "HGBA1C" in the last 72 hours. Lipid Profile No results for input(s): "CHOL", "HDL", "LDLCALC", "TRIG", "CHOLHDL",  "LDLDIRECT" in the last 72 hours. Thyroid function studies No results for input(s): "TSH", "T4TOTAL", "T3FREE", "THYROIDAB" in the last 72 hours.  Invalid input(s): "FREET3" Anemia work up No results for input(s): "VITAMINB12", "FOLATE", "FERRITIN", "TIBC", "IRON", "RETICCTPCT" in the last 72 hours. Urinalysis    Component Value Date/Time   COLORURINE YELLOW 12/28/2021 0642   APPEARANCEUR CLEAR 12/28/2021 0642   LABSPEC 1.020 12/28/2021 0642   PHURINE 5.0 12/28/2021 0642   GLUCOSEU NEGATIVE 12/28/2021 0642   HGBUR SMALL (A) 12/28/2021 0642   BILIRUBINUR NEGATIVE 12/28/2021 0642   KETONESUR NEGATIVE 12/28/2021 0642   PROTEINUR NEGATIVE 12/28/2021 0642   NITRITE NEGATIVE 12/28/2021 0642   LEUKOCYTESUR NEGATIVE 12/28/2021 0642   Sepsis Labs Recent Labs  Lab 06/24/22 1659 06/25/22 0400 06/26/22 0250 06/27/22 0251  WBC 7.8 6.9 5.4 5.5   Microbiology No results found for this or any previous visit (from the past 240 hour(s)).   Time coordinating discharge: 35 minutes  SIGNED:   Aline August, MD  [facility] 06/27/2022, 9:07 AM

## 2022-06-27 NOTE — Progress Notes (Signed)
CSW met with patient and discussed substance use consult. He reported that he has already been in touch with the [facility] representative who is going to schedule him for an intake either later today or tomorrow. He did express interest in other local resources as well so CSW provided them to him. He stated he is ready to leave substance use behind as he is "62 years old and can't be acting like a kid anymore." He confirmed his PCP is through the [place] and his sister is his ride home today. No other needs identified at this time.   Gilmore Laroche, MSW, [facility]

## 2022-06-27 NOTE — Plan of Care (Signed)
Discharge instructions discussed with patient.  Patient instructed on home medications, restrictions, and follow up appointments. Belongings gathered and sent with patient.  Patients medications discussed when to take and pick up any prescriptions.  Patient discharged via wheelchair by this Probation officer.

## 2022-07-01 LAB — URINE DRUGS OF ABUSE SCREEN W ALC, ROUTINE (REF LAB)
Amphetamines, Urine: NEGATIVE ng/mL
Barbiturate, Ur: NEGATIVE ng/mL
Benzodiazepine Quant, Ur: NEGATIVE ng/mL
Ethanol U, Quan: NEGATIVE %
Methadone Screen, Urine: NEGATIVE ng/mL
Phencyclidine, Ur: NEGATIVE ng/mL
Propoxyphene, Urine: NEGATIVE ng/mL

## 2022-07-01 LAB — OPIATES CONFIRMATION, URINE
CODEINE: NEGATIVE
MORPHINE: POSITIVE — AB
Morphine GC/MS Conf: 581 ng/mL
OPIATES: POSITIVE — AB

## 2022-07-01 LAB — COCAINE CONF, UR
Benzoylecgonine GC/MS Conf: 2360 ng/mL
Cocaine Metab Quant, Ur: POSITIVE — AB

## 2022-07-01 LAB — PANEL 799049
CARBOXY THC GC/MS CONF: 67 ng/mL
Cannabinoid GC/MS, Ur: POSITIVE — AB

## 2022-07-05 DIAGNOSIS — Z5901 Sheltered homelessness: Secondary | ICD-10-CM | POA: Diagnosis not present

## 2022-07-06 DIAGNOSIS — F1424 Cocaine dependence with cocaine-induced mood disorder: Secondary | ICD-10-CM | POA: Diagnosis not present

## 2022-07-06 DIAGNOSIS — K7460 Unspecified cirrhosis of liver: Secondary | ICD-10-CM | POA: Diagnosis not present

## 2022-07-06 DIAGNOSIS — G4700 Insomnia, unspecified: Secondary | ICD-10-CM | POA: Diagnosis not present

## 2022-07-06 DIAGNOSIS — F1490 Cocaine use, unspecified, uncomplicated: Secondary | ICD-10-CM | POA: Diagnosis not present

## 2022-07-06 DIAGNOSIS — I10 Essential (primary) hypertension: Secondary | ICD-10-CM | POA: Diagnosis not present

## 2022-07-06 DIAGNOSIS — F1223 Cannabis dependence with withdrawal: Secondary | ICD-10-CM | POA: Diagnosis not present

## 2022-07-06 DIAGNOSIS — F1010 Alcohol abuse, uncomplicated: Secondary | ICD-10-CM | POA: Diagnosis not present

## 2022-07-06 DIAGNOSIS — F1020 Alcohol dependence, uncomplicated: Secondary | ICD-10-CM | POA: Diagnosis not present

## 2022-07-06 DIAGNOSIS — F329 Major depressive disorder, single episode, unspecified: Secondary | ICD-10-CM | POA: Diagnosis not present

## 2022-07-06 DIAGNOSIS — F1090 Alcohol use, unspecified, uncomplicated: Secondary | ICD-10-CM | POA: Diagnosis not present

## 2022-07-06 DIAGNOSIS — F1024 Alcohol dependence with alcohol-induced mood disorder: Secondary | ICD-10-CM | POA: Diagnosis not present

## 2022-07-06 DIAGNOSIS — I712 Thoracic aortic aneurysm, without rupture, unspecified: Secondary | ICD-10-CM | POA: Diagnosis not present

## 2022-07-06 DIAGNOSIS — R45851 Suicidal ideations: Secondary | ICD-10-CM | POA: Diagnosis not present

## 2022-07-06 DIAGNOSIS — Z8619 Personal history of other infectious and parasitic diseases: Secondary | ICD-10-CM | POA: Diagnosis not present

## 2022-07-06 DIAGNOSIS — Z7982 Long term (current) use of aspirin: Secondary | ICD-10-CM | POA: Diagnosis not present

## 2022-07-06 DIAGNOSIS — F1410 Cocaine abuse, uncomplicated: Secondary | ICD-10-CM | POA: Diagnosis not present

## 2022-07-06 DIAGNOSIS — Z560 Unemployment, unspecified: Secondary | ICD-10-CM | POA: Diagnosis not present

## 2022-07-06 DIAGNOSIS — F1220 Cannabis dependence, uncomplicated: Secondary | ICD-10-CM | POA: Diagnosis not present

## 2022-07-06 DIAGNOSIS — Z9151 Personal history of suicidal behavior: Secondary | ICD-10-CM | POA: Diagnosis not present

## 2022-07-06 DIAGNOSIS — F419 Anxiety disorder, unspecified: Secondary | ICD-10-CM | POA: Diagnosis not present

## 2022-07-06 DIAGNOSIS — R45850 Homicidal ideations: Secondary | ICD-10-CM | POA: Diagnosis not present

## 2022-07-06 DIAGNOSIS — Z91148 Patient's other noncompliance with medication regimen for other reason: Secondary | ICD-10-CM | POA: Diagnosis not present

## 2022-07-06 DIAGNOSIS — I714 Abdominal aortic aneurysm, without rupture, unspecified: Secondary | ICD-10-CM | POA: Diagnosis not present

## 2022-07-06 DIAGNOSIS — F12288 Cannabis dependence with other cannabis-induced disorder: Secondary | ICD-10-CM | POA: Diagnosis not present

## 2022-07-06 DIAGNOSIS — F1290 Cannabis use, unspecified, uncomplicated: Secondary | ICD-10-CM | POA: Diagnosis not present

## 2022-07-06 DIAGNOSIS — E785 Hyperlipidemia, unspecified: Secondary | ICD-10-CM | POA: Diagnosis not present

## 2022-07-06 DIAGNOSIS — F1420 Cocaine dependence, uncomplicated: Secondary | ICD-10-CM | POA: Diagnosis not present

## 2022-07-06 DIAGNOSIS — F331 Major depressive disorder, recurrent, moderate: Secondary | ICD-10-CM | POA: Diagnosis not present

## 2022-07-06 DIAGNOSIS — Y906 Blood alcohol level of 120-199 mg/100 ml: Secondary | ICD-10-CM | POA: Diagnosis not present

## 2022-07-06 DIAGNOSIS — Z5900 Homelessness unspecified: Secondary | ICD-10-CM | POA: Diagnosis not present

## 2022-07-06 DIAGNOSIS — F1914 Other psychoactive substance abuse with psychoactive substance-induced mood disorder: Secondary | ICD-10-CM | POA: Diagnosis not present

## 2022-07-06 DIAGNOSIS — F1229 Cannabis dependence with unspecified cannabis-induced disorder: Secondary | ICD-10-CM | POA: Diagnosis not present

## 2022-07-07 DIAGNOSIS — F1290 Cannabis use, unspecified, uncomplicated: Secondary | ICD-10-CM | POA: Diagnosis not present

## 2022-07-07 DIAGNOSIS — F1020 Alcohol dependence, uncomplicated: Secondary | ICD-10-CM | POA: Diagnosis not present

## 2022-07-07 DIAGNOSIS — F1420 Cocaine dependence, uncomplicated: Secondary | ICD-10-CM | POA: Diagnosis not present

## 2022-07-08 DIAGNOSIS — F1420 Cocaine dependence, uncomplicated: Secondary | ICD-10-CM | POA: Diagnosis not present

## 2022-07-08 DIAGNOSIS — F1020 Alcohol dependence, uncomplicated: Secondary | ICD-10-CM | POA: Diagnosis not present

## 2022-07-08 DIAGNOSIS — F1220 Cannabis dependence, uncomplicated: Secondary | ICD-10-CM | POA: Diagnosis not present

## 2022-07-09 DIAGNOSIS — F1020 Alcohol dependence, uncomplicated: Secondary | ICD-10-CM | POA: Diagnosis not present

## 2022-07-09 DIAGNOSIS — F1420 Cocaine dependence, uncomplicated: Secondary | ICD-10-CM | POA: Diagnosis not present

## 2022-07-09 DIAGNOSIS — F1220 Cannabis dependence, uncomplicated: Secondary | ICD-10-CM | POA: Diagnosis not present

## 2022-07-10 DIAGNOSIS — F1424 Cocaine dependence with cocaine-induced mood disorder: Secondary | ICD-10-CM | POA: Diagnosis not present

## 2022-07-10 DIAGNOSIS — F1229 Cannabis dependence with unspecified cannabis-induced disorder: Secondary | ICD-10-CM | POA: Diagnosis not present

## 2022-07-10 DIAGNOSIS — F1024 Alcohol dependence with alcohol-induced mood disorder: Secondary | ICD-10-CM | POA: Diagnosis not present

## 2022-07-10 DIAGNOSIS — Z5900 Homelessness unspecified: Secondary | ICD-10-CM | POA: Diagnosis not present

## 2022-07-16 DIAGNOSIS — Z9151 Personal history of suicidal behavior: Secondary | ICD-10-CM | POA: Diagnosis not present

## 2022-07-16 DIAGNOSIS — F1420 Cocaine dependence, uncomplicated: Secondary | ICD-10-CM | POA: Diagnosis not present

## 2022-07-16 DIAGNOSIS — F1020 Alcohol dependence, uncomplicated: Secondary | ICD-10-CM | POA: Diagnosis not present

## 2022-07-16 DIAGNOSIS — F332 Major depressive disorder, recurrent severe without psychotic features: Secondary | ICD-10-CM | POA: Diagnosis not present

## 2022-07-16 DIAGNOSIS — F1220 Cannabis dependence, uncomplicated: Secondary | ICD-10-CM | POA: Diagnosis not present

## 2022-07-18 DIAGNOSIS — F1020 Alcohol dependence, uncomplicated: Secondary | ICD-10-CM | POA: Diagnosis not present

## 2022-07-18 DIAGNOSIS — F1223 Cannabis dependence with withdrawal: Secondary | ICD-10-CM | POA: Diagnosis not present

## 2022-07-18 DIAGNOSIS — F1420 Cocaine dependence, uncomplicated: Secondary | ICD-10-CM | POA: Diagnosis not present

## 2022-07-18 DIAGNOSIS — F32A Depression, unspecified: Secondary | ICD-10-CM | POA: Diagnosis not present

## 2022-07-19 DIAGNOSIS — F1420 Cocaine dependence, uncomplicated: Secondary | ICD-10-CM | POA: Diagnosis not present

## 2022-07-19 DIAGNOSIS — F32A Depression, unspecified: Secondary | ICD-10-CM | POA: Diagnosis not present

## 2022-07-19 DIAGNOSIS — R45851 Suicidal ideations: Secondary | ICD-10-CM | POA: Diagnosis not present

## 2022-07-19 DIAGNOSIS — F1223 Cannabis dependence with withdrawal: Secondary | ICD-10-CM | POA: Diagnosis not present

## 2022-07-24 DIAGNOSIS — F1223 Cannabis dependence with withdrawal: Secondary | ICD-10-CM | POA: Diagnosis not present

## 2022-07-24 DIAGNOSIS — R519 Headache, unspecified: Secondary | ICD-10-CM | POA: Diagnosis not present

## 2022-07-24 DIAGNOSIS — F1020 Alcohol dependence, uncomplicated: Secondary | ICD-10-CM | POA: Diagnosis not present

## 2022-07-24 DIAGNOSIS — F1420 Cocaine dependence, uncomplicated: Secondary | ICD-10-CM | POA: Diagnosis not present

## 2022-07-24 DIAGNOSIS — R45851 Suicidal ideations: Secondary | ICD-10-CM | POA: Diagnosis not present

## 2022-07-25 DIAGNOSIS — F32A Depression, unspecified: Secondary | ICD-10-CM | POA: Diagnosis not present

## 2022-07-25 DIAGNOSIS — F1223 Cannabis dependence with withdrawal: Secondary | ICD-10-CM | POA: Diagnosis not present

## 2022-07-25 DIAGNOSIS — F1020 Alcohol dependence, uncomplicated: Secondary | ICD-10-CM | POA: Diagnosis not present

## 2022-07-31 DIAGNOSIS — F1020 Alcohol dependence, uncomplicated: Secondary | ICD-10-CM | POA: Diagnosis not present

## 2022-07-31 DIAGNOSIS — R45851 Suicidal ideations: Secondary | ICD-10-CM | POA: Diagnosis not present

## 2022-07-31 DIAGNOSIS — F1420 Cocaine dependence, uncomplicated: Secondary | ICD-10-CM | POA: Diagnosis not present

## 2022-07-31 DIAGNOSIS — F32A Depression, unspecified: Secondary | ICD-10-CM | POA: Diagnosis not present

## 2022-08-09 DIAGNOSIS — F1223 Cannabis dependence with withdrawal: Secondary | ICD-10-CM | POA: Diagnosis not present

## 2022-08-09 DIAGNOSIS — B1920 Unspecified viral hepatitis C without hepatic coma: Secondary | ICD-10-CM | POA: Diagnosis not present

## 2022-08-09 DIAGNOSIS — F1020 Alcohol dependence, uncomplicated: Secondary | ICD-10-CM | POA: Diagnosis not present

## 2022-08-09 DIAGNOSIS — F1420 Cocaine dependence, uncomplicated: Secondary | ICD-10-CM | POA: Diagnosis not present

## 2022-12-06 DIAGNOSIS — F1420 Cocaine dependence, uncomplicated: Secondary | ICD-10-CM | POA: Diagnosis not present

## 2022-12-06 DIAGNOSIS — F1220 Cannabis dependence, uncomplicated: Secondary | ICD-10-CM | POA: Diagnosis not present

## 2022-12-06 DIAGNOSIS — F1020 Alcohol dependence, uncomplicated: Secondary | ICD-10-CM | POA: Diagnosis not present

## 2022-12-06 DIAGNOSIS — F329 Major depressive disorder, single episode, unspecified: Secondary | ICD-10-CM | POA: Diagnosis not present

## 2022-12-06 DIAGNOSIS — F332 Major depressive disorder, recurrent severe without psychotic features: Secondary | ICD-10-CM | POA: Diagnosis not present

## 2022-12-06 DIAGNOSIS — F32A Depression, unspecified: Secondary | ICD-10-CM | POA: Diagnosis not present

## 2022-12-06 DIAGNOSIS — F1090 Alcohol use, unspecified, uncomplicated: Secondary | ICD-10-CM | POA: Diagnosis not present

## 2022-12-06 DIAGNOSIS — F1290 Cannabis use, unspecified, uncomplicated: Secondary | ICD-10-CM | POA: Diagnosis not present

## 2022-12-06 DIAGNOSIS — R45851 Suicidal ideations: Secondary | ICD-10-CM | POA: Diagnosis not present

## 2022-12-07 DIAGNOSIS — F12288 Cannabis dependence with other cannabis-induced disorder: Secondary | ICD-10-CM | POA: Diagnosis not present

## 2022-12-07 DIAGNOSIS — F1024 Alcohol dependence with alcohol-induced mood disorder: Secondary | ICD-10-CM | POA: Diagnosis not present

## 2022-12-07 DIAGNOSIS — F1424 Cocaine dependence with cocaine-induced mood disorder: Secondary | ICD-10-CM | POA: Diagnosis not present

## 2022-12-07 DIAGNOSIS — F1924 Other psychoactive substance dependence with psychoactive substance-induced mood disorder: Secondary | ICD-10-CM | POA: Diagnosis not present

## 2022-12-08 DIAGNOSIS — F1024 Alcohol dependence with alcohol-induced mood disorder: Secondary | ICD-10-CM | POA: Diagnosis not present

## 2022-12-08 DIAGNOSIS — F12288 Cannabis dependence with other cannabis-induced disorder: Secondary | ICD-10-CM | POA: Diagnosis not present

## 2022-12-08 DIAGNOSIS — F1424 Cocaine dependence with cocaine-induced mood disorder: Secondary | ICD-10-CM | POA: Diagnosis not present

## 2022-12-08 DIAGNOSIS — F1924 Other psychoactive substance dependence with psychoactive substance-induced mood disorder: Secondary | ICD-10-CM | POA: Diagnosis not present

## 2022-12-09 DIAGNOSIS — F1924 Other psychoactive substance dependence with psychoactive substance-induced mood disorder: Secondary | ICD-10-CM | POA: Diagnosis not present

## 2022-12-09 DIAGNOSIS — F12288 Cannabis dependence with other cannabis-induced disorder: Secondary | ICD-10-CM | POA: Diagnosis not present

## 2022-12-09 DIAGNOSIS — F1424 Cocaine dependence with cocaine-induced mood disorder: Secondary | ICD-10-CM | POA: Diagnosis not present

## 2022-12-09 DIAGNOSIS — F1024 Alcohol dependence with alcohol-induced mood disorder: Secondary | ICD-10-CM | POA: Diagnosis not present

## 2022-12-10 DIAGNOSIS — F1010 Alcohol abuse, uncomplicated: Secondary | ICD-10-CM | POA: Diagnosis not present

## 2022-12-10 DIAGNOSIS — F12220 Cannabis dependence with intoxication, uncomplicated: Secondary | ICD-10-CM | POA: Diagnosis not present

## 2022-12-10 DIAGNOSIS — F1490 Cocaine use, unspecified, uncomplicated: Secondary | ICD-10-CM | POA: Diagnosis not present

## 2022-12-10 DIAGNOSIS — F17210 Nicotine dependence, cigarettes, uncomplicated: Secondary | ICD-10-CM | POA: Diagnosis not present

## 2022-12-10 DIAGNOSIS — F1024 Alcohol dependence with alcohol-induced mood disorder: Secondary | ICD-10-CM | POA: Diagnosis not present

## 2022-12-10 DIAGNOSIS — F1424 Cocaine dependence with cocaine-induced mood disorder: Secondary | ICD-10-CM | POA: Diagnosis not present

## 2022-12-11 DIAGNOSIS — F1024 Alcohol dependence with alcohol-induced mood disorder: Secondary | ICD-10-CM | POA: Diagnosis not present

## 2022-12-11 DIAGNOSIS — F1424 Cocaine dependence with cocaine-induced mood disorder: Secondary | ICD-10-CM | POA: Diagnosis not present

## 2022-12-11 DIAGNOSIS — F12288 Cannabis dependence with other cannabis-induced disorder: Secondary | ICD-10-CM | POA: Diagnosis not present

## 2022-12-11 DIAGNOSIS — F17210 Nicotine dependence, cigarettes, uncomplicated: Secondary | ICD-10-CM | POA: Diagnosis not present

## 2022-12-11 DIAGNOSIS — S80922A Unspecified superficial injury of left lower leg, initial encounter: Secondary | ICD-10-CM | POA: Diagnosis not present

## 2022-12-12 DIAGNOSIS — F1424 Cocaine dependence with cocaine-induced mood disorder: Secondary | ICD-10-CM | POA: Diagnosis not present

## 2022-12-12 DIAGNOSIS — F17210 Nicotine dependence, cigarettes, uncomplicated: Secondary | ICD-10-CM | POA: Diagnosis not present

## 2022-12-12 DIAGNOSIS — F1024 Alcohol dependence with alcohol-induced mood disorder: Secondary | ICD-10-CM | POA: Diagnosis not present

## 2022-12-12 DIAGNOSIS — F12288 Cannabis dependence with other cannabis-induced disorder: Secondary | ICD-10-CM | POA: Diagnosis not present

## 2022-12-17 DIAGNOSIS — F1220 Cannabis dependence, uncomplicated: Secondary | ICD-10-CM | POA: Diagnosis not present

## 2022-12-17 DIAGNOSIS — F17210 Nicotine dependence, cigarettes, uncomplicated: Secondary | ICD-10-CM | POA: Diagnosis not present

## 2022-12-17 DIAGNOSIS — F1420 Cocaine dependence, uncomplicated: Secondary | ICD-10-CM | POA: Diagnosis not present

## 2022-12-17 DIAGNOSIS — F1020 Alcohol dependence, uncomplicated: Secondary | ICD-10-CM | POA: Diagnosis not present

## 2022-12-24 DIAGNOSIS — F17210 Nicotine dependence, cigarettes, uncomplicated: Secondary | ICD-10-CM | POA: Diagnosis not present

## 2022-12-24 DIAGNOSIS — F1220 Cannabis dependence, uncomplicated: Secondary | ICD-10-CM | POA: Diagnosis not present

## 2022-12-24 DIAGNOSIS — F1020 Alcohol dependence, uncomplicated: Secondary | ICD-10-CM | POA: Diagnosis not present

## 2022-12-24 DIAGNOSIS — F1420 Cocaine dependence, uncomplicated: Secondary | ICD-10-CM | POA: Diagnosis not present

## 2022-12-25 DIAGNOSIS — F1220 Cannabis dependence, uncomplicated: Secondary | ICD-10-CM | POA: Diagnosis not present

## 2022-12-25 DIAGNOSIS — F1020 Alcohol dependence, uncomplicated: Secondary | ICD-10-CM | POA: Diagnosis not present

## 2022-12-25 DIAGNOSIS — F1420 Cocaine dependence, uncomplicated: Secondary | ICD-10-CM | POA: Diagnosis not present

## 2022-12-26 DIAGNOSIS — F1420 Cocaine dependence, uncomplicated: Secondary | ICD-10-CM | POA: Diagnosis not present

## 2022-12-26 DIAGNOSIS — F1220 Cannabis dependence, uncomplicated: Secondary | ICD-10-CM | POA: Diagnosis not present

## 2022-12-26 DIAGNOSIS — F1020 Alcohol dependence, uncomplicated: Secondary | ICD-10-CM | POA: Diagnosis not present

## 2022-12-27 DIAGNOSIS — F1220 Cannabis dependence, uncomplicated: Secondary | ICD-10-CM | POA: Diagnosis not present

## 2022-12-27 DIAGNOSIS — F1020 Alcohol dependence, uncomplicated: Secondary | ICD-10-CM | POA: Diagnosis not present

## 2022-12-27 DIAGNOSIS — F1420 Cocaine dependence, uncomplicated: Secondary | ICD-10-CM | POA: Diagnosis not present

## 2022-12-31 DIAGNOSIS — F17210 Nicotine dependence, cigarettes, uncomplicated: Secondary | ICD-10-CM | POA: Diagnosis not present

## 2022-12-31 DIAGNOSIS — F1020 Alcohol dependence, uncomplicated: Secondary | ICD-10-CM | POA: Diagnosis not present

## 2022-12-31 DIAGNOSIS — F1420 Cocaine dependence, uncomplicated: Secondary | ICD-10-CM | POA: Diagnosis not present

## 2022-12-31 DIAGNOSIS — F1220 Cannabis dependence, uncomplicated: Secondary | ICD-10-CM | POA: Diagnosis not present

## 2023-01-08 DIAGNOSIS — F1020 Alcohol dependence, uncomplicated: Secondary | ICD-10-CM | POA: Diagnosis not present

## 2023-01-08 DIAGNOSIS — F1420 Cocaine dependence, uncomplicated: Secondary | ICD-10-CM | POA: Diagnosis not present

## 2023-01-08 DIAGNOSIS — Z720 Tobacco use: Secondary | ICD-10-CM | POA: Diagnosis not present

## 2023-01-08 DIAGNOSIS — F1220 Cannabis dependence, uncomplicated: Secondary | ICD-10-CM | POA: Diagnosis not present

## 2023-01-10 DIAGNOSIS — Z720 Tobacco use: Secondary | ICD-10-CM | POA: Diagnosis not present

## 2023-01-10 DIAGNOSIS — F1220 Cannabis dependence, uncomplicated: Secondary | ICD-10-CM | POA: Diagnosis not present

## 2023-01-10 DIAGNOSIS — F1420 Cocaine dependence, uncomplicated: Secondary | ICD-10-CM | POA: Diagnosis not present

## 2023-01-10 DIAGNOSIS — F1020 Alcohol dependence, uncomplicated: Secondary | ICD-10-CM | POA: Diagnosis not present

## 2023-01-15 DIAGNOSIS — F32A Depression, unspecified: Secondary | ICD-10-CM | POA: Diagnosis not present

## 2023-01-15 DIAGNOSIS — R45850 Homicidal ideations: Secondary | ICD-10-CM | POA: Diagnosis not present

## 2023-04-15 ENCOUNTER — Emergency Department (HOSPITAL_COMMUNITY)
Admission: EM | Admit: 2023-04-15 | Discharge: 2023-04-17 | Payer: 59 | Attending: Emergency Medicine | Admitting: Emergency Medicine

## 2023-04-15 ENCOUNTER — Other Ambulatory Visit: Payer: Self-pay

## 2023-04-15 DIAGNOSIS — I10 Essential (primary) hypertension: Secondary | ICD-10-CM | POA: Insufficient documentation

## 2023-04-15 DIAGNOSIS — Z743 Need for continuous supervision: Secondary | ICD-10-CM | POA: Diagnosis not present

## 2023-04-15 DIAGNOSIS — R45851 Suicidal ideations: Secondary | ICD-10-CM | POA: Diagnosis not present

## 2023-04-15 DIAGNOSIS — Z20822 Contact with and (suspected) exposure to covid-19: Secondary | ICD-10-CM | POA: Diagnosis not present

## 2023-04-15 DIAGNOSIS — F332 Major depressive disorder, recurrent severe without psychotic features: Secondary | ICD-10-CM | POA: Diagnosis not present

## 2023-04-15 DIAGNOSIS — F17210 Nicotine dependence, cigarettes, uncomplicated: Secondary | ICD-10-CM | POA: Insufficient documentation

## 2023-04-15 DIAGNOSIS — Z5900 Homelessness unspecified: Secondary | ICD-10-CM | POA: Diagnosis not present

## 2023-04-15 DIAGNOSIS — Z7982 Long term (current) use of aspirin: Secondary | ICD-10-CM | POA: Diagnosis not present

## 2023-04-15 DIAGNOSIS — F32A Depression, unspecified: Secondary | ICD-10-CM

## 2023-04-15 DIAGNOSIS — Z79899 Other long term (current) drug therapy: Secondary | ICD-10-CM | POA: Insufficient documentation

## 2023-04-15 LAB — CBC
HCT: 46.8 % (ref 39.0–52.0)
Hemoglobin: 15 g/dL (ref 13.0–17.0)
MCH: 29.1 pg (ref 26.0–34.0)
MCHC: 32.1 g/dL (ref 30.0–36.0)
MCV: 90.9 fL (ref 80.0–100.0)
Platelets: 216 10*3/uL (ref 150–400)
RBC: 5.15 MIL/uL (ref 4.22–5.81)
RDW: 15.2 % (ref 11.5–15.5)
WBC: 5 10*3/uL (ref 4.0–10.5)
nRBC: 0 % (ref 0.0–0.2)

## 2023-04-15 LAB — COMPREHENSIVE METABOLIC PANEL
ALT: 16 U/L (ref 0–44)
AST: 28 U/L (ref 15–41)
Albumin: 4.4 g/dL (ref 3.5–5.0)
Alkaline Phosphatase: 103 U/L (ref 38–126)
Anion gap: 5 (ref 5–15)
BUN: 9 mg/dL (ref 8–23)
CO2: 27 mmol/L (ref 22–32)
Calcium: 9.3 mg/dL (ref 8.9–10.3)
Chloride: 105 mmol/L (ref 98–111)
Creatinine, Ser: 1 mg/dL (ref 0.61–1.24)
GFR, Estimated: >60 mL/min (ref >60)
Glucose, Bld: 133 mg/dL — ABNORMAL HIGH (ref 70–99)
Potassium: 3.6 mmol/L (ref 3.5–5.1)
Sodium: 137 mmol/L (ref 135–145)
Total Bilirubin: 1.1 mg/dL (ref <1.2)
Total Protein: 8 g/dL (ref 6.5–8.1)

## 2023-04-15 LAB — ETHANOL: Alcohol, Ethyl (B): <10 mg/dL (ref <10)

## 2023-04-15 LAB — SALICYLATE LEVEL: Salicylate Lvl: <7 mg/dL — ABNORMAL LOW (ref 7.0–30.0)

## 2023-04-15 LAB — ACETAMINOPHEN LEVEL: Acetaminophen (Tylenol), Serum: <10 ug/mL — ABNORMAL LOW (ref 10–30)

## 2023-04-15 MED ORDER — PRAZOSIN HCL 1 MG PO CAPS
2.0000 mg | ORAL_CAPSULE | Freq: Every day | ORAL | Status: DC
  Administered 2023-04-15 – 2023-04-16 (×2): 2 mg via ORAL
  Filled 2023-04-15 (×2): qty 2

## 2023-04-15 MED ORDER — PANTOPRAZOLE SODIUM 40 MG PO TBEC
40.0000 mg | DELAYED_RELEASE_TABLET | Freq: Every day | ORAL | Status: DC
  Administered 2023-04-15 – 2023-04-17 (×3): 40 mg via ORAL
  Filled 2023-04-15 (×3): qty 1

## 2023-04-15 MED ORDER — LABETALOL HCL 100 MG PO TABS
100.0000 mg | ORAL_TABLET | Freq: Two times a day (BID) | ORAL | Status: DC
  Administered 2023-04-15 – 2023-04-17 (×4): 100 mg via ORAL
  Filled 2023-04-15 (×4): qty 1

## 2023-04-15 MED ORDER — ESCITALOPRAM OXALATE 10 MG PO TABS
20.0000 mg | ORAL_TABLET | Freq: Every day | ORAL | Status: DC
  Administered 2023-04-15 – 2023-04-16 (×2): 20 mg via ORAL
  Filled 2023-04-15 (×2): qty 2

## 2023-04-15 MED ORDER — AMLODIPINE BESYLATE 5 MG PO TABS
10.0000 mg | ORAL_TABLET | Freq: Every day | ORAL | Status: DC
  Administered 2023-04-15 – 2023-04-17 (×3): 10 mg via ORAL
  Filled 2023-04-15 (×3): qty 2

## 2023-04-15 MED ORDER — ALBUTEROL SULFATE HFA 108 (90 BASE) MCG/ACT IN AERS: 2.0000 | INHALATION_SPRAY | Freq: Four times a day (QID) | RESPIRATORY_TRACT | Status: DC | PRN

## 2023-04-15 MED ORDER — ATORVASTATIN CALCIUM 40 MG PO TABS
40.0000 mg | ORAL_TABLET | Freq: Every day | ORAL | Status: DC
  Administered 2023-04-15 – 2023-04-17 (×3): 40 mg via ORAL
  Filled 2023-04-15 (×3): qty 1

## 2023-04-15 MED ORDER — ASPIRIN 81 MG PO TBEC
81.0000 mg | DELAYED_RELEASE_TABLET | Freq: Every day | ORAL | Status: DC
  Administered 2023-04-15 – 2023-04-17 (×3): 81 mg via ORAL
  Filled 2023-04-15 (×3): qty 1

## 2023-04-15 MED ORDER — QUETIAPINE FUMARATE 100 MG PO TABS
200.0000 mg | ORAL_TABLET | Freq: Every day | ORAL | Status: DC
  Administered 2023-04-15 – 2023-04-16 (×2): 200 mg via ORAL
  Filled 2023-04-15 (×2): qty 2

## 2023-04-15 MED ORDER — HYDROCERIN EX CREA: 1.0000 | TOPICAL_CREAM | Freq: Three times a day (TID) | CUTANEOUS | Status: DC | PRN

## 2023-04-15 MED ORDER — TRAZODONE HCL 100 MG PO TABS: 100.0000 mg | ORAL_TABLET | Freq: Every evening | ORAL | Status: DC | PRN

## 2023-04-15 NOTE — Progress Notes (Signed)
LCSW Progress Note  [id number]   Angel Golden  04/15/2023  11:11 PM     Inpatient Behavioral Health Placement  Pt meets inpatient criteria per Earney Navy, NP-PMHNP-BC. Per chart review pt has Veteran ([place]) Benefits. This CSW sent referral to [facility] system to be reviewed.   First Shift Disposition CSW to complete VA specific forms for VA transfer if bed availability is open for 04/16/2023.   Destination  Service Provider Address Phone Fax  [facility]  [address] [place] [address] [phone]  [facility]  [place] [phone]  [place] [phone]  [facility] [place] [phone]  [place] [phone]  [facility]  [address] [place] [address] [phone]  [facility] (after hours)  [address], [place] [address] [phone]  [facility] [place] [facility]  [address] [address] [phone]  [facility]  [address], [place] [address] [phone]    Situation ongoing,  CSW will follow up.    Maryjean Ka, MSW, LCSWA 04/15/2023 11:11 PM

## 2023-04-15 NOTE — ED Triage Notes (Signed)
Pt BIBA from street. C/O SI.  AOx4

## 2023-04-15 NOTE — ED Notes (Signed)
Pt changed out into burgundy scrubs, pt belongings bag placed in cabinet 16-18   Pt has a black pouch  Pt new balance  Black hat Tan shirt  Gray sweats  [employer] blue jacket [place]

## 2023-04-15 NOTE — Consult Note (Signed)
[facility] ED ASSESSMENT   Reason for Consult:  Psychiatry evaluation Referring Physician:  ER Physician Patient Identification: Angel Golden MRN:  [id number] ED Chief Complaint: MDD (major depressive disorder), recurrent severe, without psychosis (HCC)  Diagnosis:  Principal Problem:   MDD (major depressive disorder), recurrent severe, without psychosis [facility])   ED Assessment Time Calculation: Start Time: 1834 Stop Time: 1852 Total Time in Minutes (Assessment Completion): 18   Subjective:   Angel Golden is a 62 y.o. Golden patient admitted with previous hx of  PTSD, MDD and Polysubstance abuse came to the ER with c/o of feeling suicidal and Homicidal.  He has no particular person in mind to harm but states anybody that stands in his way he will hurt them.  HPI:  Angel Golden, 62 years old [place] [employer] seen this evening came in with c/o of feeling stressed out, depressed and willing to kill anybody that gets  his way and he will kill himself.  Patient does not have any particular stress bothering him but states he is tired of life.  Patient rates depression 8/10 with 10 being severe depression.  Patient states he has struggled with drugs and alcoholism and it has affected his liver.  Patient reports that he was hospitalized at [facility] three months ago  No record of him hospitalized at [facility] or receiving care at [facility].  His drug of choice is Cocaine and Cannabis and his choice of Alcohol includes Beer, Wine and Liquor.  He reports drinking a large can of Beer before coming to the ER today.  Patient states he is homeless but stays with a friend.  He last took Medications three days ago stating he is out of his Medications.  Patient reports poor sleep but good appetite.  Patient remains suicidal and homicidal this evening but denies AVH.  UDS is Pending. This patient is unable to contract for safety at this time.  He reports feeling hopeless sand worthless.  He is unemployed and  depends on what he can get from the the [place].  We will contact the VA for bed availability and if not we will fax out records to facilities with available beds.  Patient has resumed all of his home Medications. Past Psychiatric History: previous hx of  PTSD, MDD and Polysubstance abuse-Cocaine, Cannabis, Alcohol  Risk to Self or Others: Is the patient at risk to self? Yes Has the patient been a risk to self in the past 6 months? Yes Has the patient been a risk to self within the distant past? Yes Is the patient a risk to others? Yes Has the patient been a risk to others in the past 6 months? Yes Has the patient been a risk to others within the distant past? Yes  [place] Scale:  Flowsheet Row ED from 04/15/2023 in [facility] Emergency Department at [facility] ED to Hosp-Admission (Discharged) from 06/24/2022 in [place] 5W Medical Specialty PCU Admission (Discharged) from 12/28/2021 in [facility] 4E CV SURGICAL PROGRESSIVE CARE  C-SSRS RISK CATEGORY Moderate Risk No Risk No Risk       AIMS:  , , ,  ,   ASAM:    Substance Abuse:     Past Medical History:  Past Medical History:  Diagnosis Date   Bone spur of other site    rt foot   Cirrhosis (HCC)    Hepatitis C    Hypertension    PTSD (post-traumatic stress disorder)     Past  Surgical History:  Procedure Laterality Date   ABDOMINAL AORTIC ENDOVASCULAR STENT GRAFT N/A 12/28/2021   Procedure: ABDOMINAL AORTIC ENDOVASCULAR STENT GRAFT;  Surgeon: Victorino Sparrow, MD;  [facility] OR;  Service: Vascular;  Laterality: N/A;   HAND SURGERY Right    HAND SURGERY Left    Family History:  Family History  Problem Relation Age of Onset   Heart failure Mother    Stroke Father 18       first one   Heart failure Sister    Cancer - Lung Sister    Family Psychiatric  History: denies Social History:  Social History   Substance and Sexual Activity  Alcohol Use Yes   Alcohol/week: 12.0 standard drinks of alcohol   Types: 12 Cans  of beer per week   Comment: occ     Social History   Substance and Sexual Activity  Drug Use Yes   Types: Cocaine, Marijuana   Comment: Not since recent diagnosis    Social History   Socioeconomic History   Marital status: Single    Spouse name: Not on file   Number of children: 0   Years of education: Not on file   Highest education level: Not on file  Occupational History   Not on file  Tobacco Use   Smoking status: Some Days    Current packs/day: 0.50    Average packs/day: 0.5 packs/day for 45.0 years (22.5 ttl pk-yrs)    Types: Cigarettes   Smokeless tobacco: Never  Vaping Use   Vaping status: Never Used  Substance and Sexual Activity   Alcohol use: Yes    Alcohol/week: 12.0 standard drinks of alcohol    Types: 12 Cans of beer per week    Comment: occ   Drug use: Yes    Types: Cocaine, Marijuana    Comment: Not since recent diagnosis   Sexual activity: Not on file  Other Topics Concern   Not on file  Social History Narrative   Not on file   Social Determinants of Health   Financial Resource Strain: Not on file  Food Insecurity: No Food Insecurity (06/25/2022)   Hunger Vital Sign    Worried About Running Out of Food in the Last Year: Never true    Ran Out of Food in the Last Year: Never true  Transportation Needs: No Transportation Needs (06/25/2022)   PRAPARE - Administrator, Civil Service (Medical): No    Lack of Transportation (Non-Medical): No  Physical Activity: Not on file  Stress: Not on file  Social Connections: Unknown (09/25/2021)   [facility] from [facility]   Social Network    Social Network: Not on file   Additional Social History:    Allergies:  No Known Allergies  Labs:  Results for orders placed or performed during the hospital encounter of 04/15/23 (from the past 48 hour(s))  Comprehensive metabolic panel     Status: Abnormal   Collection Time: 04/15/23  1:58 PM  Result Value Ref Range   Sodium 137 135  - 145 mmol/L   Potassium 3.6 3.5 - 5.1 mmol/L   Chloride 105 98 - 111 mmol/L   CO2 27 22 - 32 mmol/L   Glucose, Bld 133 (H) 70 - 99 mg/dL    Comment: Glucose reference range applies only to samples taken after fasting for at least 8 hours.   BUN 9 8 - 23 mg/dL   Creatinine, Ser 2.95 0.61 - 1.24 mg/dL  Calcium 9.3 8.9 - 10.3 mg/dL   Total Protein 8.0 6.5 - 8.1 g/dL   Albumin 4.4 3.5 - 5.0 g/dL   AST 28 15 - 41 U/L   ALT 16 0 - 44 U/L   Alkaline Phosphatase 103 38 - 126 U/L   Total Bilirubin 1.1 <1.2 mg/dL   GFR, Estimated >19 >14 mL/min    Comment: (NOTE) Calculated using the CKD-EPI Creatinine Equation (2021)    Anion gap 5 5 - 15    Comment: Performed at [facility], [address], [place], [place] [address]  Ethanol     Status: None   Collection Time: 04/15/23  1:58 PM  Result Value Ref Range   Alcohol, Ethyl (B) <10 <10 mg/dL    Comment: (NOTE) Lowest detectable limit for serum alcohol is 10 mg/dL.  For medical purposes only. Performed at [facility], [address], [place], [place] [address]   Salicylate level     Status: Abnormal   Collection Time: 04/15/23  1:58 PM  Result Value Ref Range   Salicylate Lvl <7.0 (L) 7.0 - 30.0 mg/dL    Comment: Performed at [facility], [address], [place], [place] [address]  Acetaminophen level     Status: Abnormal   Collection Time: 04/15/23  1:58 PM  Result Value Ref Range   Acetaminophen (Tylenol), Serum <10 (L) 10 - 30 ug/mL    Comment: (NOTE) Therapeutic concentrations vary significantly. A range of 10-30 ug/mL  may be an effective concentration for many patients. However, some  are best treated at concentrations outside of this range. Acetaminophen concentrations >150 ug/mL at 4 hours after ingestion  and >50 ug/mL at 12 hours after ingestion are often associated with  toxic reactions.  Performed at [facility], [address] [place], [place] [address]   cbc     Status: None   Collection Time: 04/15/23  1:58 PM  Result Value Ref Range   WBC 5.0 4.0 - 10.5 K/uL   RBC 5.15 4.22 - 5.81 MIL/uL   Hemoglobin 15.0 13.0 - 17.0 g/dL   HCT 95.2 84.1 - 32.4 %   MCV 90.9 80.0 - 100.0 fL   MCH 29.1 26.0 - 34.0 pg   MCHC 32.1 30.0 - 36.0 g/dL   RDW 40.1 02.7 - 25.3 %   Platelets 216 150 - 400 K/uL   nRBC 0.0 0.0 - 0.2 %    Comment: Performed at [facility], [address], [place], [place] [address]    Current Facility-Administered Medications  Medication Dose Route Frequency Provider Last Rate Last Admin   albuterol (VENTOLIN HFA) 108 (90 Base) MCG/ACT inhaler 2 puff  2 puff Inhalation Q6H PRN Jacalyn Lefevre, MD       amLODipine (NORVASC) tablet 10 mg  10 mg Oral Daily Jacalyn Lefevre, MD   10 mg at 04/15/23 1733   aspirin EC tablet 81 mg  81 mg Oral Daily Jacalyn Lefevre, MD   81 mg at 04/15/23 1733   atorvastatin (LIPITOR) tablet 40 mg  40 mg Oral Daily Jacalyn Lefevre, MD   40 mg at 04/15/23 1733   escitalopram (LEXAPRO) tablet 20 mg  20 mg Oral QHS Jacalyn Lefevre, MD       hydrocerin (EUCERIN) cream 1 Application  1 Application Topical TID PRN Jacalyn Lefevre, MD       labetalol (NORMODYNE) tablet 100 mg  100 mg Oral BID Jacalyn Lefevre, MD  pantoprazole (PROTONIX) EC tablet 40 mg  40 mg Oral Daily Jacalyn Lefevre, MD   40 mg at 04/15/23 1733   prazosin (MINIPRESS) capsule 2 mg  2 mg Oral QHS Jacalyn Lefevre, MD       QUEtiapine (SEROQUEL) tablet 200 mg  200 mg Oral QHS Jacalyn Lefevre, MD       traZODone (DESYREL) tablet 100 mg  100 mg Oral QHS PRN Jacalyn Lefevre, MD       Current Outpatient Medications  Medication Sig Dispense Refill   albuterol (VENTOLIN HFA) 108 (90 Base) MCG/ACT inhaler Inhale 2 puffs into the lungs every 6 (six) hours as needed for shortness of breath.     amLODipine (NORVASC) 10 MG tablet Take 1 tablet (10 mg total) by mouth daily. 30 tablet 0   aspirin EC 81  MG tablet Take 1 tablet (81 mg total) by mouth daily. Swallow whole. 90 tablet 3   atorvastatin (LIPITOR) 40 MG tablet Take 1 tablet (40 mg total) by mouth daily. 30 tablet 1   escitalopram (LEXAPRO) 20 MG tablet Take 20 mg by mouth at bedtime.     hydrocortisone (ANUSOL-HC) 2.5 % rectal cream Place 1 Application rectally 2 (two) times daily as needed for hemorrhoids. Patient would like his medicines mailed to him (Patient taking differently: Place 1 Application rectally 2 (two) times daily as needed for hemorrhoids.)     labetalol (NORMODYNE) 100 MG tablet Take 1 tablet (100 mg total) by mouth 2 (two) times daily. 60 tablet 3   naloxone (NARCAN) nasal spray 4 mg/0.1 mL Place 1 spray into the nose See admin instructions. "INSTILL 1 SPRAY INTO ONE NOSTRIL AS DIRECTED FOR SUBSTANCE USE DISORDER FOR OPIOID OVERDOSE - CALL 911 IMMEDIATELY, ADMINISTER DOSE, THEN TURN PERSON ON SIDE - IF NO RESPONSE IN 2-3 MINUTES OR PERSON RESPONDS BUT RELAPSES, REPEAT USING A NEW SPRAY DEVICE AND SPRAY INTO THE OTHER NOSTRIL"     ondansetron (ZOFRAN) 4 MG tablet Take 1 tablet (4 mg total) by mouth every 8 (eight) hours as needed for nausea or vomiting. 20 tablet 0   pantoprazole (PROTONIX) 40 MG tablet Take 1 tablet (40 mg total) by mouth daily. 30 tablet 1   prazosin (MINIPRESS) 2 MG capsule Take 2 mg by mouth at bedtime.     QUEtiapine (SEROQUEL) 200 MG tablet Take 200 mg by mouth at bedtime.     Skin Protectants, Misc. (EUCERIN) cream Apply 1 Application topically 3 (three) times daily as needed (for skin irritation).     traZODone (DESYREL) 100 MG tablet Take 1 tablet (100 mg total) by mouth at bedtime as needed for sleep. (Patient taking differently: Take 100 mg by mouth at bedtime.) 30 tablet 0   folic acid (FOLVITE) 1 MG tablet Take 1 tablet (1 mg total) by mouth daily. (Patient not taking: Reported on 04/15/2023) 30 tablet 0   Multiple Vitamin (MULTIVITAMIN WITH MINERALS) TABS tablet Take 1 tablet by mouth daily.  (Patient not taking: Reported on 04/15/2023)     thiamine (VITAMIN B-1) 100 MG tablet Take 1 tablet (100 mg total) by mouth daily. (Patient not taking: Reported on 04/15/2023) 30 tablet 0   witch hazel-glycerin (TUCKS) pad Apply 1 Application topically 4 (four) times daily as needed (irritaiton after bowel movement).      Musculoskeletal: Strength & Muscle Tone:  lying down in stretcher Gait & Station:  lying down in stretcher Patient leans:  see above.   Psychiatric Specialty Exam: Presentation  General Appearance:  Casual;  Neat  Eye Contact: None  Speech: Clear and Coherent  Speech Volume: Normal  Handedness: Right   Mood and Affect  Mood: Angry; Anxious; Depressed; Irritable  Affect: Congruent; Depressed   Thought Process  Thought Processes: Coherent  Descriptions of Associations:Intact  Orientation:Full (Time, Place and Person)  Thought Content:Logical  History of Schizophrenia/Schizoaffective disorder:No data recorded Duration of Psychotic Symptoms:No data recorded Hallucinations:Hallucinations: None  Ideas of Reference:None  Suicidal Thoughts:Suicidal Thoughts: Yes, Active SI Active Intent and/or Plan: Without Plan  Homicidal Thoughts:Homicidal Thoughts: Yes, Active HI Active Intent and/or Plan: Without Plan   Sensorium  Memory: Immediate Good; Recent Good; Remote Good  Judgment: Intact  Insight: Present   Executive Functions  Concentration: Good  Attention Span: Good  Recall: Fair  Fund of Knowledge: Fair  Language: Good   Psychomotor Activity  Psychomotor Activity: Psychomotor Activity: Normal   Assets  Assets: Communication Skills; Desire for Improvement    Sleep  Sleep: Sleep: Poor   Physical Exam: Physical Exam Vitals and nursing note reviewed.  Constitutional:      Appearance: Normal appearance.  HENT:     Nose: Nose normal.  Cardiovascular:     Rate and Rhythm: Normal rate and regular rhythm.   Pulmonary:     Effort: Pulmonary effort is normal.  Musculoskeletal:        General: Normal range of motion.  Neurological:     Mental Status: He is alert and oriented to person, place, and time.  Psychiatric:        Attention and Perception: Attention and perception normal.        Mood and Affect: Mood is anxious and depressed. Affect is angry.        Speech: Speech normal.        Behavior: Behavior is cooperative.        Thought Content: Thought content includes homicidal and suicidal ideation.        Judgment: Judgment is impulsive.    Review of Systems  Constitutional: Negative.   HENT: Negative.    Eyes: Negative.   Respiratory: Negative.    Cardiovascular: Negative.   Gastrointestinal: Negative.   Genitourinary: Negative.   Musculoskeletal: Negative.   Skin: Negative.   Neurological: Negative.   Endo/Heme/Allergies: Negative.   Psychiatric/Behavioral:  Positive for depression, substance abuse and suicidal ideas. The patient is nervous/anxious.    Blood pressure (!) 153/89, pulse 87, temperature 98 F (36.7 C), temperature source Oral, resp. rate 18, height 5\' 10"  (1.778 m), weight 68 kg, SpO2 100%. Body mass index is 21.51 kg/m.  Medical Decision Making: [facility] and if not we will fax out records to facilities with available beds.  Patient has resumed all of his home Medications.   Disposition:  Admit, seek bed placement.  Earney Navy, NP-PMHNP-BC 04/15/2023 6:58 PM

## 2023-04-15 NOTE — ED Provider Notes (Signed)
[place] EMERGENCY DEPARTMENT AT [facility] Provider Note   CSN: [id number] Arrival date & time: 04/15/23  1347     History  Chief Complaint  Patient presents with   Suicidal    Angel Golden is a 62 y.o. male.  Pt is a 62 yo male with pmhx significant for HTN, polysubstance abuse, cirrhosis, hld, and homelessness.  Pt was brought in with si.  Pt said he's been staying at a friend's house.  He said he has not taken his regular meds in 3 days because he's been depressed.  He denies any specific plan.         Home Medications Prior to Admission medications   Medication Sig Start Date End Date Taking? Authorizing Provider  amLODipine (NORVASC) 10 MG tablet Take 1 tablet (10 mg total) by mouth daily. 05/10/22   Regalado, Jon Billings A, MD  aspirin EC 81 MG tablet Take 1 tablet (81 mg total) by mouth daily. Swallow whole. 12/07/21   Patwardhan, Anabel Bene, MD  atorvastatin (LIPITOR) 40 MG tablet Take 1 tablet (40 mg total) by mouth daily. 12/05/21   Dameron, Nolberto Hanlon, DO  Cyanocobalamin (B-12 PO) Take 1 tablet by mouth daily.    [provider]  escitalopram (LEXAPRO) 10 MG tablet Take 10 mg by mouth daily. 05/21/17   [provider]  folic acid (FOLVITE) 1 MG tablet Take 1 tablet (1 mg total) by mouth daily. 06/28/22   Glade Lloyd, MD  hydrocortisone (ANUSOL-HC) 2.5 % rectal cream Place 1 Application rectally 2 (two) times daily as needed for hemorrhoids. Patient would like his medicines mailed to him 06/27/22   Glade Lloyd, MD  labetalol (NORMODYNE) 100 MG tablet Take 1 tablet (100 mg total) by mouth 2 (two) times daily. 12/07/21   Patwardhan, Anabel Bene, MD  Multiple Vitamin (MULTIVITAMIN WITH MINERALS) TABS tablet Take 1 tablet by mouth daily. 06/28/22   Glade Lloyd, MD  ondansetron (ZOFRAN) 4 MG tablet Take 1 tablet (4 mg total) by mouth every 8 (eight) hours as needed for nausea or vomiting. 06/27/22   Glade Lloyd, MD  pantoprazole (PROTONIX) 40 MG tablet  Take 1 tablet (40 mg total) by mouth daily. 05/09/22 05/09/23  Regalado, Jon Billings A, MD  thiamine (VITAMIN B-1) 100 MG tablet Take 1 tablet (100 mg total) by mouth daily. 06/28/22   Glade Lloyd, MD  traZODone (DESYREL) 100 MG tablet Take 1 tablet (100 mg total) by mouth at bedtime as needed for sleep. 05/10/20   Money, Gerlene Burdock, FNP  triamcinolone (KENALOG) 0.025 % cream Apply 1 Application topically 2 (two) times daily as needed (rash). 05/21/17   [provider]  witch hazel-glycerin (TUCKS) pad Apply 1 Application topically 4 (four) times daily as needed (irritaiton after bowel movement).    [provider]      Allergies    Patient has no known allergies.    Review of Systems   Review of Systems  Psychiatric/Behavioral:  Positive for suicidal ideas.   All other systems reviewed and are negative.   Physical Exam Updated Vital Signs BP (!) 153/89 (BP Location: Left Arm)   Pulse 87   Temp 98 F (36.7 C) (Oral)   Resp 18   Ht 5\' 10"  (1.778 m)   Wt 68 kg   SpO2 100%   BMI 21.51 kg/m  Physical Exam Vitals and nursing note reviewed.  Constitutional:      Appearance: Normal appearance.  HENT:     Head: Normocephalic and  atraumatic.     Right Ear: External ear normal.     Left Ear: External ear normal.     Nose: Nose normal.     Mouth/Throat:     Mouth: Mucous membranes are moist.     Pharynx: Oropharynx is clear.  Eyes:     Extraocular Movements: Extraocular movements intact.     Conjunctiva/sclera: Conjunctivae normal.     Pupils: Pupils are equal, round, and reactive to light.  Cardiovascular:     Rate and Rhythm: Normal rate and regular rhythm.     Pulses: Normal pulses.     Heart sounds: Normal heart sounds.  Pulmonary:     Effort: Pulmonary effort is normal.     Breath sounds: Normal breath sounds.  Abdominal:     General: Abdomen is flat. Bowel sounds are normal.     Palpations: Abdomen is soft.  Musculoskeletal:        General: Normal range of  motion.     Cervical back: Normal range of motion and neck supple.  Skin:    General: Skin is warm.     Capillary Refill: Capillary refill takes less than 2 seconds.  Neurological:     General: No focal deficit present.     Mental Status: He is alert and oriented to person, place, and time.  Psychiatric:        Mood and Affect: Mood normal.        Behavior: Behavior normal.        Thought Content: Thought content normal.        Judgment: Judgment normal.     ED Results / Procedures / Treatments   Labs (all labs ordered are listed, but only abnormal results are displayed) Labs Reviewed  COMPREHENSIVE METABOLIC PANEL - Abnormal; Notable for the following components:      Result Value   Glucose, Bld 133 (*)    All other components within normal limits  SALICYLATE LEVEL - Abnormal; Notable for the following components:   Salicylate Lvl <7.0 (*)    All other components within normal limits  ACETAMINOPHEN LEVEL - Abnormal; Notable for the following components:   Acetaminophen (Tylenol), Serum <10 (*)    All other components within normal limits  ETHANOL  CBC  RAPID URINE DRUG SCREEN, HOSP PERFORMED    EKG None  Radiology No results found.  Procedures Procedures    Medications Ordered in ED Medications - No data to display  ED Course/ Medical Decision Making/ A&P                                 Medical Decision Making Amount and/or Complexity of Data Reviewed Labs: ordered.   This patient presents to the ED for concern of si, this involves an extensive number of treatment options, and is a complaint that carries with it a high risk of complications and morbidity.  The differential diagnosis includes psych, malingering   Co morbidities that complicate the patient evaluation  HTN, polysubstance abuse, cirrhosis, hld, and homelessness   Additional history obtained:  Additional history obtained from epic chart review  Lab Tests:  I Ordered, and personally  interpreted labs.  The pertinent results include:  cmp nl, acet neg, sal neg, etoh neg, cbc nl   Cardiac Monitoring:  The patient was maintained on a cardiac monitor.  I personally viewed and interpreted the cardiac monitored which showed an underlying rhythm of: nsr  Medicines ordered and prescription drug management:   I have reviewed the patients home medicines and have made adjustments as needed  Consultations Obtained:  I requested consultation with TTS,  and discussed lab and imaging findings as well as pertinent plan - consult pending  Problem List / ED Course:  SI:  Pt is medically cleared.  Pt has a hx of depression, but he is also homeless and it's been very cold.  TTS has been consulted.  Disposition per TTS.   Reevaluation:  After the interventions noted above, I reevaluated the patient and found that they have :improved   Social Determinants of Health:  Lives at home   Dispostion:  After consideration of the diagnostic results and the patients response to treatment, I feel that the patent would benefit from TTS consult.          Final Clinical Impression(s) / ED Diagnoses Final diagnoses:  Suicidal ideation  Depression, unspecified depression type  Homeless    Rx / DC Orders ED Discharge Orders     None         Jacalyn Lefevre, MD 04/15/23 1606

## 2023-04-16 DIAGNOSIS — F332 Major depressive disorder, recurrent severe without psychotic features: Secondary | ICD-10-CM

## 2023-04-16 LAB — RAPID URINE DRUG SCREEN, HOSP PERFORMED
Amphetamines: NOT DETECTED
Barbiturates: NOT DETECTED
Benzodiazepines: NOT DETECTED
Cocaine: POSITIVE — AB
Opiates: NOT DETECTED
Tetrahydrocannabinol: POSITIVE — AB

## 2023-04-16 LAB — SARS CORONAVIRUS 2 BY RT PCR: SARS Coronavirus 2 by RT PCR: NEGATIVE

## 2023-04-16 NOTE — Progress Notes (Signed)
CSW has sent over documents for [facility] to review patient. At this time they have requested a UDS on the patient. CSW made providers aware once the UDS is complete CSW will fax over so the review of the patient can continue. 2nd shift to follow up with final decision    Angel Valla Pacey LCSW-A   04/16/2023 2:23 PM

## 2023-04-16 NOTE — Progress Notes (Signed)
This CSW spoke with Kendra Opitz on Duty (AOD),[facility], [facility] [phone] who provided [facility] forms to CSW via email and requested forms be sent back via email [email or web address] or fax [phone].  CSW then spoke back to [employer] requesting that CSW check to see if pt receives services at [facility] first and if there is no beds at [facility] or if pt prefers to come to [facility] over [place] to follow back up.  Maryjean Ka, MSW, LCSWA 04/16/2023 12:09 AM

## 2023-04-16 NOTE — ED Notes (Signed)
Patient aware urine is needed he stated he did not need to go right now will let me know as soon as he needs to go.

## 2023-04-16 NOTE — ED Provider Notes (Signed)
Emergency Medicine Observation Re-evaluation Note  Angel Golden is a 62 y.o. male, seen on rounds today.  Pt initially presented to the ED for complaints of Suicidal Currently, the patient is medically cleared awaiting inpatient psychiatric placement.  Physical Exam  BP 120/79 (BP Location: Left Arm)   Pulse 77   Temp 97.6 F (36.4 C) (Oral)   Resp 18   Ht 5\' 10"  (1.778 m)   Wt 68 kg   SpO2 100%   BMI 21.51 kg/m  Physical Exam General: NAD Cardiac: RR Lungs: even unlabored Psych: calm  ED Course / MDM  EKG:EKG Interpretation Date/Time:  Monday April 15 2023 16:35:31 EST Ventricular Rate:  79 PR Interval:  152 QRS Duration:  120 QT Interval:  378 QTC Calculation: 433 R Axis:   71  Text Interpretation: Normal sinus rhythm Right bundle branch block Abnormal ECG When compared with ECG of 24-Jun-2022 17:04, PREVIOUS ECG IS PRESENT No significant change since last tracing Confirmed by Jacalyn Lefevre [id number]) on 04/15/2023 5:02:20 PM  I have reviewed the labs performed to date as well as medications administered while in observation.  Recent changes in the last 24 hours include none.  Plan  Current plan is for possible [facility] transfer.Alvira Monday, MD 04/16/23 [phone]

## 2023-04-16 NOTE — Progress Notes (Signed)
This CSW spoke with Mo, AOD at [facility] [phone]. Mo,AOD advised this CSW to have 1st shift complete [facility] Transfer paperwork and send clinical documents all together via fax to [phone].  Mo,AOD informed this CSW that pt receives services at this location and pt's [facility] Social Worker informed pt to come to [facility] as a walk-in for services due to pt calling [facility] around 3pm. First shift Disposition CSW to complete [facility] full [employer] and Transport form. Mo, AOD requested this CSW to not send tonight because pt transfers will not be processed until 1st shift by Care coordination.  Maryjean Ka, MSW, [facility] 04/16/2023 12:27 AM

## 2023-04-16 NOTE — Progress Notes (Signed)
This CSW sent labs to [facility] afterhours via fax [phone].  This CSW attempted to call [facility] and speak with the AOD but was advised to call back. This CSW will attempt to call back before the end of 2nd shift.  Maryjean Ka, MSW, [facility] 04/16/2023 10:33 PM

## 2023-04-16 NOTE — ED Notes (Signed)
Patient given meal tray.

## 2023-04-16 NOTE — ED Notes (Signed)
Patient has been alert this shift. Patient has slept and rested most of shift. Patient is medication compliant. Patient has been cooperative.  No noted suicidal ideation . No homicidal ideation noted.  Patient ambulatory and can complete all ADLs.

## 2023-04-16 NOTE — Progress Notes (Signed)
[facility] Psych ED Progress Note  04/16/2023 4:41 PM JACADEN RAIMONDO  MRN:  [id number]   Subjective:  Angel Golden is a 62 y.o. male patient admitted with previous hx of  PTSD, MDD and Polysubstance abuse came to the ER with c/o of feeling suicidal and Homicidal.  He has no particular person in mind to harm but states anybody that stands in his way he will hurt them. Patient remains depressed and suicidal with no plan.  Patient's information have been sent to [facility] and [place] for consideration for admission.  He resumed his home Medications and plan is to fax out records to other non [facility] facilities for available beds.  He denies AVH. Principal Problem: MDD (major depressive disorder), recurrent severe, without psychosis (HCC) Diagnosis:  Principal Problem:   MDD (major depressive disorder), recurrent severe, without psychosis (HCC)   ED Assessment Time Calculation: Start Time: 1622 Stop Time: 1634 Total Time in Minutes (Assessment Completion): 12   Past Psychiatric History: see initial evaluation note  [place] Scale:  Flowsheet Row ED from 04/15/2023 in [facility] Emergency Department at [facility] ED to Hosp-Admission (Discharged) from 06/24/2022 in [place] 5W Medical Specialty PCU Admission (Discharged) from 12/28/2021 in [facility] 4E CV SURGICAL PROGRESSIVE CARE  C-SSRS RISK CATEGORY Moderate Risk No Risk No Risk       Past Medical History:  Past Medical History:  Diagnosis Date   Bone spur of other site    rt foot   Cirrhosis (HCC)    Hepatitis C    Hypertension    PTSD (post-traumatic stress disorder)     Past Surgical History:  Procedure Laterality Date   ABDOMINAL AORTIC ENDOVASCULAR STENT GRAFT N/A 12/28/2021   Procedure: ABDOMINAL AORTIC ENDOVASCULAR STENT GRAFT;  Surgeon: Victorino Sparrow, MD;  Location: [facility] OR;  Service: Vascular;  Laterality: N/A;   HAND SURGERY Right    HAND SURGERY Left    Family History:  Family History  Problem Relation Age of Onset    Heart failure Mother    Stroke Father 66       first one   Heart failure Sister    Cancer - Lung Sister    Family Psychiatric  History: see initial evaluation note Social History:  Social History   Substance and Sexual Activity  Alcohol Use Yes   Alcohol/week: 12.0 standard drinks of alcohol   Types: 12 Cans of beer per week   Comment: occ     Social History   Substance and Sexual Activity  Drug Use Yes   Types: Cocaine, Marijuana   Comment: Not since recent diagnosis    Social History   Socioeconomic History   Marital status: Single    Spouse name: Not on file   Number of children: 0   Years of education: Not on file   Highest education level: Not on file  Occupational History   Not on file  Tobacco Use   Smoking status: Some Days    Current packs/day: 0.50    Average packs/day: 0.5 packs/day for 45.0 years (22.5 ttl pk-yrs)    Types: Cigarettes   Smokeless tobacco: Never  Vaping Use   Vaping status: Never Used  Substance and Sexual Activity   Alcohol use: Yes    Alcohol/week: 12.0 standard drinks of alcohol    Types: 12 Cans of beer per week    Comment: occ   Drug use: Yes    Types: Cocaine, Marijuana    Comment: Not  since recent diagnosis   Sexual activity: Not on file  Other Topics Concern   Not on file  Social History Narrative   Not on file   Social Determinants of Health   Financial Resource Strain: Not on file  Food Insecurity: No Food Insecurity (06/25/2022)   Hunger Vital Sign    Worried About Running Out of Food in the Last Year: Never true    Ran Out of Food in the Last Year: Never true  Transportation Needs: No Transportation Needs (06/25/2022)   PRAPARE - Administrator, Civil Service (Medical): No    Lack of Transportation (Non-Medical): No  Physical Activity: Not on file  Stress: Not on file  Social Connections: Unknown (09/25/2021)   Received from [facility]   Social Network    Social Network: Not on  file    Sleep: Good  Appetite:  Fair  Current Medications: Current Facility-Administered Medications  Medication Dose Route Frequency Provider Last Rate Last Admin   albuterol (VENTOLIN HFA) 108 (90 Base) MCG/ACT inhaler 2 puff  2 puff Inhalation Q6H PRN Jacalyn Lefevre, MD       amLODipine (NORVASC) tablet 10 mg  10 mg Oral Daily Jacalyn Lefevre, MD   10 mg at 04/16/23 1112   aspirin EC tablet 81 mg  81 mg Oral Daily Jacalyn Lefevre, MD   81 mg at 04/16/23 1113   atorvastatin (LIPITOR) tablet 40 mg  40 mg Oral Daily Jacalyn Lefevre, MD   40 mg at 04/16/23 1113   escitalopram (LEXAPRO) tablet 20 mg  20 mg Oral QHS Jacalyn Lefevre, MD   20 mg at 04/15/23 2201   hydrocerin (EUCERIN) cream 1 Application  1 Application Topical TID PRN Jacalyn Lefevre, MD       labetalol (NORMODYNE) tablet 100 mg  100 mg Oral BID Jacalyn Lefevre, MD   100 mg at 04/16/23 1113   pantoprazole (PROTONIX) EC tablet 40 mg  40 mg Oral Daily Jacalyn Lefevre, MD   40 mg at 04/16/23 1113   prazosin (MINIPRESS) capsule 2 mg  2 mg Oral QHS Jacalyn Lefevre, MD   2 mg at 04/15/23 2201   QUEtiapine (SEROQUEL) tablet 200 mg  200 mg Oral QHS Jacalyn Lefevre, MD   200 mg at 04/15/23 2200   traZODone (DESYREL) tablet 100 mg  100 mg Oral QHS PRN Jacalyn Lefevre, MD       Current Outpatient Medications  Medication Sig Dispense Refill   albuterol (VENTOLIN HFA) 108 (90 Base) MCG/ACT inhaler Inhale 2 puffs into the lungs every 6 (six) hours as needed for shortness of breath.     amLODipine (NORVASC) 10 MG tablet Take 1 tablet (10 mg total) by mouth daily. 30 tablet 0   aspirin EC 81 MG tablet Take 1 tablet (81 mg total) by mouth daily. Swallow whole. 90 tablet 3   atorvastatin (LIPITOR) 40 MG tablet Take 1 tablet (40 mg total) by mouth daily. 30 tablet 1   escitalopram (LEXAPRO) 20 MG tablet Take 20 mg by mouth at bedtime.     hydrocortisone (ANUSOL-HC) 2.5 % rectal cream Place 1 Application rectally 2 (two) times daily as needed  for hemorrhoids. Patient would like his medicines mailed to him (Patient taking differently: Place 1 Application rectally 2 (two) times daily as needed for hemorrhoids.)     labetalol (NORMODYNE) 100 MG tablet Take 1 tablet (100 mg total) by mouth 2 (two) times daily. 60 tablet 3   naloxone (NARCAN) nasal  spray 4 mg/0.1 mL Place 1 spray into the nose See admin instructions. "INSTILL 1 SPRAY INTO ONE NOSTRIL AS DIRECTED FOR SUBSTANCE USE DISORDER FOR OPIOID OVERDOSE - CALL 911 IMMEDIATELY, ADMINISTER DOSE, THEN TURN PERSON ON SIDE - IF NO RESPONSE IN 2-3 MINUTES OR PERSON RESPONDS BUT RELAPSES, REPEAT USING A NEW SPRAY DEVICE AND SPRAY INTO THE OTHER NOSTRIL"     ondansetron (ZOFRAN) 4 MG tablet Take 1 tablet (4 mg total) by mouth every 8 (eight) hours as needed for nausea or vomiting. 20 tablet 0   pantoprazole (PROTONIX) 40 MG tablet Take 1 tablet (40 mg total) by mouth daily. 30 tablet 1   prazosin (MINIPRESS) 2 MG capsule Take 2 mg by mouth at bedtime.     QUEtiapine (SEROQUEL) 200 MG tablet Take 200 mg by mouth at bedtime.     Skin Protectants, Misc. (EUCERIN) cream Apply 1 Application topically 3 (three) times daily as needed (for skin irritation).     traZODone (DESYREL) 100 MG tablet Take 1 tablet (100 mg total) by mouth at bedtime as needed for sleep. (Patient taking differently: Take 100 mg by mouth at bedtime.) 30 tablet 0   folic acid (FOLVITE) 1 MG tablet Take 1 tablet (1 mg total) by mouth daily. (Patient not taking: Reported on 04/15/2023) 30 tablet 0   Multiple Vitamin (MULTIVITAMIN WITH MINERALS) TABS tablet Take 1 tablet by mouth daily. (Patient not taking: Reported on 04/15/2023)     thiamine (VITAMIN B-1) 100 MG tablet Take 1 tablet (100 mg total) by mouth daily. (Patient not taking: Reported on 04/15/2023) 30 tablet 0   witch hazel-glycerin (TUCKS) pad Apply 1 Application topically 4 (four) times daily as needed (irritaiton after bowel movement).      Lab Results:  Results for orders  placed or performed during the hospital encounter of 04/15/23 (from the past 48 hour(s))  Comprehensive metabolic panel     Status: Abnormal   Collection Time: 04/15/23  1:58 PM  Result Value Ref Range   Sodium 137 135 - 145 mmol/L   Potassium 3.6 3.5 - 5.1 mmol/L   Chloride 105 98 - 111 mmol/L   CO2 27 22 - 32 mmol/L   Glucose, Bld 133 (H) 70 - 99 mg/dL    Comment: Glucose reference range applies only to samples taken after fasting for at least 8 hours.   BUN 9 8 - 23 mg/dL   Creatinine, Ser 6.57 0.61 - 1.24 mg/dL   Calcium 9.3 8.9 - 84.6 mg/dL   Total Protein 8.0 6.5 - 8.1 g/dL   Albumin 4.4 3.5 - 5.0 g/dL   AST 28 15 - 41 U/L   ALT 16 0 - 44 U/L   Alkaline Phosphatase 103 38 - 126 U/L   Total Bilirubin 1.1 <1.2 mg/dL   GFR, Estimated >96 >29 mL/min    Comment: (NOTE) Calculated using the CKD-EPI Creatinine Equation (2021)    Anion gap 5 5 - 15    Comment: Performed at [facility], [address], [place], [place] [address]  Ethanol     Status: None   Collection Time: 04/15/23  1:58 PM  Result Value Ref Range   Alcohol, Ethyl (B) <10 <10 mg/dL    Comment: (NOTE) Lowest detectable limit for serum alcohol is 10 mg/dL.  For medical purposes only. Performed at [facility], [address] [place], [place] [address]   Salicylate level     Status: Abnormal   Collection Time: 04/15/23  1:58 PM  Result Value Ref Range   Salicylate Lvl <7.0 (L) 7.0 - 30.0 mg/dL    Comment: Performed at [facility], [address], [place], [place] [address]  Acetaminophen level     Status: Abnormal   Collection Time: 04/15/23  1:58 PM  Result Value Ref Range   Acetaminophen (Tylenol), Serum <10 (L) 10 - 30 ug/mL    Comment: (NOTE) Therapeutic concentrations vary significantly. A range of 10-30 ug/mL  may be an effective concentration for many patients. However, some  are best treated at concentrations outside of this  range. Acetaminophen concentrations >150 ug/mL at 4 hours after ingestion  and >50 ug/mL at 12 hours after ingestion are often associated with  toxic reactions.  Performed at [facility], [address], [place], [place] [address]   cbc     Status: None   Collection Time: 04/15/23  1:58 PM  Result Value Ref Range   WBC 5.0 4.0 - 10.5 K/uL   RBC 5.15 4.22 - 5.81 MIL/uL   Hemoglobin 15.0 13.0 - 17.0 g/dL   HCT 82.9 56.2 - 13.0 %   MCV 90.9 80.0 - 100.0 fL   MCH 29.1 26.0 - 34.0 pg   MCHC 32.1 30.0 - 36.0 g/dL   RDW 86.5 78.4 - 69.6 %   Platelets 216 150 - 400 K/uL   nRBC 0.0 0.0 - 0.2 %    Comment: Performed at [facility], [address] [place], [place] [address]  SARS Coronavirus 2 by RT PCR (hospital order, performed in [facility] hospital lab) *cepheid single result test* Anterior Nasal Swab     Status: None   Collection Time: 04/16/23 11:17 AM   Specimen: Anterior Nasal Swab  Result Value Ref Range   SARS Coronavirus 2 by RT PCR NEGATIVE NEGATIVE    Comment: (NOTE) SARS-CoV-2 target nucleic acids are NOT DETECTED.  The SARS-CoV-2 RNA is generally detectable in upper and lower respiratory specimens during the acute phase of infection. The lowest concentration of SARS-CoV-2 viral copies this assay can detect is 250 copies / mL. A negative result does not preclude SARS-CoV-2 infection and should not be used as the sole basis for treatment or other patient management decisions.  A negative result may occur with improper specimen collection / handling, submission of specimen other than nasopharyngeal swab, presence of viral mutation(s) within the areas targeted by this assay, and inadequate number of viral copies (<250 copies / mL). A negative result must be combined with clinical observations, patient history, and epidemiological information.  Fact Sheet for Patients:   RoadLapTop.co.za  Fact Sheet for  Healthcare Providers: http://kim-miller.com/  This test is not yet approved or  cleared by the [place] FDA and has been authorized for detection and/or diagnosis of SARS-CoV-2 by FDA under an Emergency Use Authorization (EUA).  This EUA will remain in effect (meaning this test can be used) for the duration of the COVID-19 declaration under Section 564(b)(1) of the Act, 21 U.S.C. section 360bbb-3(b)(1), unless the authorization is terminated or revoked sooner.  Performed at [facility], [address] [place], [place] [address]     Blood Alcohol level:  Lab Results  Component Value Date   [facility] <10 04/15/2023   ETH <10 05/09/2020    Physical Findings:  CIWA:    COWS:     Musculoskeletal: Strength & Muscle Tone: within normal limits Gait & Station: normal Patient leans: Teaching laboratory technician  Exam:  Presentation  General Appearance:  Casual; Neat  Eye Contact: Fleeting  Speech: Clear and Coherent  Speech Volume: Normal  Handedness: Right   Mood and Affect  Mood: Depressed; Angry  Affect: Congruent   Thought Process  Thought Processes: Coherent  Descriptions of Associations:Intact  Orientation:Full (Time, Place and Person)  Thought Content:Logical  History of Schizophrenia/Schizoaffective disorder:No data recorded Duration of Psychotic Symptoms:No data recorded Hallucinations:Hallucinations: None  Ideas of Reference:None  Suicidal Thoughts:Suicidal Thoughts: Yes, Active SI Active Intent and/or Plan: Without Plan  Homicidal Thoughts:Homicidal Thoughts: No HI Active Intent and/or Plan: Without Plan   Sensorium  Memory: Immediate Good; Recent Good; Remote Good  Judgment: Fair  Insight: Fair   Executive Functions  Concentration: Good  Attention Span: Good  Recall: Good  Fund of Knowledge: Good  Language: Good   Psychomotor Activity  Psychomotor  Activity: Psychomotor Activity: Normal   Assets  Assets: Communication Skills; Desire for Improvement   Sleep  Sleep: Sleep: Good    Physical Exam: Physical Exam Vitals and nursing note reviewed.  Constitutional:      Appearance: Normal appearance.  HENT:     Nose: Nose normal.  Cardiovascular:     Rate and Rhythm: Normal rate and regular rhythm.  Pulmonary:     Effort: Pulmonary effort is normal.  Musculoskeletal:        General: Normal range of motion.  Skin:    General: Skin is dry.  Neurological:     Mental Status: He is alert and oriented to person, place, and time.  Psychiatric:        Attention and Perception: Attention and perception normal.        Mood and Affect: Mood is anxious and depressed. Affect is angry.        Speech: Speech normal.        Behavior: Behavior is cooperative.        Thought Content: Thought content includes homicidal and suicidal ideation.        Judgment: Judgment is impulsive.    Review of Systems  Constitutional: Negative.   HENT: Negative.    Eyes: Negative.   Respiratory: Negative.    Cardiovascular: Negative.   Gastrointestinal: Negative.   Genitourinary: Negative.   Musculoskeletal: Negative.   Skin: Negative.   Neurological: Negative.   Endo/Heme/Allergies: Negative.   Psychiatric/Behavioral:  Positive for depression, substance abuse and suicidal ideas. The patient is nervous/anxious.    Blood pressure 119/82, pulse 79, temperature 98.2 F (36.8 C), temperature source Oral, resp. rate 16, height 5\' 10"  (1.778 m), weight 68 kg, SpO2 100%. Body mass index is 21.51 kg/m.   Medical Decision Making: Records are faxed to [facility] and [place] [facility] consideration for bed.  If that fails  we will seek placement at non [facility] hospitals Psychiatry unit for treatment and stabilization.  His home Medications are resumed.  UDS is pending as required by the [facility] hospitals.for admission. Admit, seek bed placement.  Earney Navy, NP-PMHNP-BC 04/16/2023, 4:41 PM

## 2023-04-17 DIAGNOSIS — F332 Major depressive disorder, recurrent severe without psychotic features: Secondary | ICD-10-CM | POA: Diagnosis not present

## 2023-04-17 NOTE — Progress Notes (Signed)
Pt has been accepted to. [facility] ED for today 04/17/2023 Bed assignment: [facility] ED for repeat COVID TEST   Pt meets inpatient criteria per: Phebe Colla NP   Attending Physician will WU:[id number] Price MD   Report can be called to: [phone] EXT [phone] or [phone]   Pt can arrive anytime today  Care Team Notified: Phebe Colla NP, Taylour Nape RN   [place] Brynlei Klausner LCSW-A   04/17/2023 1:02 PM

## 2023-04-17 NOTE — ED Provider Notes (Signed)
Emergency Medicine Observation Re-evaluation Note  Angel Golden is a 62 y.o. male, seen on rounds today.  Pt initially presented to the ED for complaints of Suicidal Currently, the patient is not having any acute complaints.  Physical Exam  BP 110/78 (BP Location: Right Arm)   Pulse 78   Temp 98.7 F (37.1 C) (Oral)   Resp 16   Ht 5\' 10"  (1.778 m)   Wt 68 kg   SpO2 97%   BMI 21.51 kg/m  Physical Exam General: Resting comfortably in stretcher Lungs: Normal work of breathing Psych: Calm  ED Course / MDM  EKG:EKG Interpretation Date/Time:  Monday April 15 2023 16:35:31 EST Ventricular Rate:  79 PR Interval:  152 QRS Duration:  120 QT Interval:  378 QTC Calculation: 433 R Axis:   71  Text Interpretation: Normal sinus rhythm Right bundle branch block Abnormal ECG When compared with ECG of 24-Jun-2022 17:04, PREVIOUS ECG IS PRESENT No significant change since last tracing Confirmed by Jacalyn Lefevre [phone]) on 04/15/2023 5:02:20 PM  I have reviewed the labs performed to date as well as medications administered while in observation.  Recent changes in the last 24 hours include he works at the [employer].  Plan  Current plan is for possible [facility] transfer.    Rondel Baton, MD 04/17/23 0730

## 2023-04-17 NOTE — Progress Notes (Addendum)
[facility] Inpatient Behavioral Health Placement  This CSW spoke with Mo, AOD at [facility] [phone] who confirmed that PENDING items were received that this CSW submitted. Mo, AOD advised that items have been scanned into pt's chart and Care Coordination will follow up during 1st shift. Mo, AOD apologized for the delay but explained that due to high acuity that pt transfers would not take place until 1st shift. 1st shift CSW to follow up.   Maryjean Ka, MSW, LCSWA 04/17/2023 12:42 AM

## 2023-04-19 DIAGNOSIS — F1420 Cocaine dependence, uncomplicated: Secondary | ICD-10-CM | POA: Diagnosis not present

## 2023-04-19 DIAGNOSIS — F1020 Alcohol dependence, uncomplicated: Secondary | ICD-10-CM | POA: Diagnosis not present

## 2023-04-19 DIAGNOSIS — F1220 Cannabis dependence, uncomplicated: Secondary | ICD-10-CM | POA: Diagnosis not present

## 2023-04-22 DIAGNOSIS — K7460 Unspecified cirrhosis of liver: Secondary | ICD-10-CM | POA: Diagnosis not present

## 2023-04-22 DIAGNOSIS — F1420 Cocaine dependence, uncomplicated: Secondary | ICD-10-CM | POA: Diagnosis not present

## 2023-04-22 DIAGNOSIS — E785 Hyperlipidemia, unspecified: Secondary | ICD-10-CM | POA: Diagnosis not present

## 2023-04-22 DIAGNOSIS — I10 Essential (primary) hypertension: Secondary | ICD-10-CM | POA: Diagnosis not present

## 2023-04-22 DIAGNOSIS — F1020 Alcohol dependence, uncomplicated: Secondary | ICD-10-CM | POA: Diagnosis not present

## 2023-04-22 DIAGNOSIS — I714 Abdominal aortic aneurysm, without rupture, unspecified: Secondary | ICD-10-CM | POA: Diagnosis not present

## 2023-04-22 DIAGNOSIS — F1220 Cannabis dependence, uncomplicated: Secondary | ICD-10-CM | POA: Diagnosis not present

## 2023-04-25 DIAGNOSIS — F1210 Cannabis abuse, uncomplicated: Secondary | ICD-10-CM | POA: Diagnosis not present

## 2023-04-25 DIAGNOSIS — F203 Undifferentiated schizophrenia: Secondary | ICD-10-CM | POA: Diagnosis not present

## 2023-04-25 DIAGNOSIS — F1020 Alcohol dependence, uncomplicated: Secondary | ICD-10-CM | POA: Diagnosis not present

## 2023-04-25 DIAGNOSIS — F1420 Cocaine dependence, uncomplicated: Secondary | ICD-10-CM | POA: Diagnosis not present

## 2023-05-06 DIAGNOSIS — F1420 Cocaine dependence, uncomplicated: Secondary | ICD-10-CM | POA: Diagnosis not present

## 2023-05-06 DIAGNOSIS — F1020 Alcohol dependence, uncomplicated: Secondary | ICD-10-CM | POA: Diagnosis not present

## 2023-05-06 DIAGNOSIS — F1220 Cannabis dependence, uncomplicated: Secondary | ICD-10-CM | POA: Diagnosis not present

## 2023-05-07 DIAGNOSIS — F1020 Alcohol dependence, uncomplicated: Secondary | ICD-10-CM | POA: Diagnosis not present

## 2023-05-07 DIAGNOSIS — F1220 Cannabis dependence, uncomplicated: Secondary | ICD-10-CM | POA: Diagnosis not present

## 2023-05-07 DIAGNOSIS — F1420 Cocaine dependence, uncomplicated: Secondary | ICD-10-CM | POA: Diagnosis not present

## 2023-05-09 DIAGNOSIS — F1420 Cocaine dependence, uncomplicated: Secondary | ICD-10-CM | POA: Diagnosis not present

## 2023-05-19 DIAGNOSIS — L814 Other melanin hyperpigmentation: Secondary | ICD-10-CM | POA: Diagnosis not present

## 2023-05-19 DIAGNOSIS — R21 Rash and other nonspecific skin eruption: Secondary | ICD-10-CM | POA: Diagnosis not present

## 2023-05-20 DIAGNOSIS — F1220 Cannabis dependence, uncomplicated: Secondary | ICD-10-CM | POA: Diagnosis not present

## 2023-05-20 DIAGNOSIS — F1020 Alcohol dependence, uncomplicated: Secondary | ICD-10-CM | POA: Diagnosis not present

## 2023-05-20 DIAGNOSIS — F1420 Cocaine dependence, uncomplicated: Secondary | ICD-10-CM | POA: Diagnosis not present

## 2023-05-21 DIAGNOSIS — F1220 Cannabis dependence, uncomplicated: Secondary | ICD-10-CM | POA: Diagnosis not present

## 2023-05-21 DIAGNOSIS — F1020 Alcohol dependence, uncomplicated: Secondary | ICD-10-CM | POA: Diagnosis not present

## 2023-05-21 DIAGNOSIS — F1420 Cocaine dependence, uncomplicated: Secondary | ICD-10-CM | POA: Diagnosis not present

## 2023-05-22 DIAGNOSIS — F1020 Alcohol dependence, uncomplicated: Secondary | ICD-10-CM | POA: Diagnosis not present

## 2023-06-06 DIAGNOSIS — F1220 Cannabis dependence, uncomplicated: Secondary | ICD-10-CM | POA: Diagnosis not present

## 2023-06-06 DIAGNOSIS — F1420 Cocaine dependence, uncomplicated: Secondary | ICD-10-CM | POA: Diagnosis not present

## 2023-06-06 DIAGNOSIS — F17200 Nicotine dependence, unspecified, uncomplicated: Secondary | ICD-10-CM | POA: Diagnosis not present

## 2023-06-06 DIAGNOSIS — F1020 Alcohol dependence, uncomplicated: Secondary | ICD-10-CM | POA: Diagnosis not present

## 2023-06-18 ENCOUNTER — Other Ambulatory Visit: Payer: Self-pay

## 2023-06-18 DIAGNOSIS — I7143 Infrarenal abdominal aortic aneurysm, without rupture: Secondary | ICD-10-CM

## 2023-07-01 NOTE — Progress Notes (Deleted)
 Office Note     CC:  AAA 6.2cm Requesting Provider:  Annie Sable, MD  HPI: Angel Golden is a 63 y.o. (Nov 05, 1960) male presenting in follow up s/p 12/28/2021 EVAR for 6.2cm AAA.   Angel Golden was lost to follow up after surgery.   On  exam today, Angel Golden was doing well.  He had no complaints.  He denied back pain, abdominal pain, chest pain. He denies symptoms of claudication, ischemic rest pain, tissue loss.  No previous abdominal surgery  Smoker, daily aspirin, has not used cocaine since his hospitalization.   Past Medical History:  Diagnosis Date   Bone spur of other site    rt foot   Cirrhosis (HCC)    Hepatitis C    Hypertension    PTSD (post-traumatic stress disorder)     Past Surgical History:  Procedure Laterality Date   ABDOMINAL AORTIC ENDOVASCULAR STENT GRAFT N/A 12/28/2021   Procedure: ABDOMINAL AORTIC ENDOVASCULAR STENT GRAFT;  Surgeon: Angel Sparrow, MD;  Location: [facility] OR;  Service: Vascular;  Laterality: N/A;   HAND SURGERY Right    HAND SURGERY Left     Social History   Socioeconomic History   Marital status: Single    Spouse name: Not on file   Number of children: 0   Years of education: Not on file   Highest education level: Not on file  Occupational History   Not on file  Tobacco Use   Smoking status: Some Days    Current packs/day: 0.50    Average packs/day: 0.5 packs/day for 45.0 years (22.5 ttl pk-yrs)    Types: Cigarettes   Smokeless tobacco: Never  Vaping Use   Vaping status: Never Used  Substance and Sexual Activity   Alcohol use: Yes    Alcohol/week: 12.0 standard drinks of alcohol    Types: 12 Cans of beer per week    Comment: occ   Drug use: Yes    Types: Cocaine, Marijuana    Comment: Not since recent diagnosis   Sexual activity: Not on file  Other Topics Concern   Not on file  Social History Narrative   Not on file   Social Drivers of Health   Financial Resource Strain: Not on file  Food Insecurity: No Food Insecurity  (06/25/2022)   Hunger Vital Sign    Worried About Running Out of Food in the Last Year: Never true    Ran Out of Food in the Last Year: Never true  Transportation Needs: No Transportation Needs (06/25/2022)   PRAPARE - Administrator, Civil Service (Medical): No    Lack of Transportation (Non-Medical): No  Physical Activity: Not on file  Stress: Not on file  Social Connections: Unknown (09/25/2021)   [facility]   Social Network    Social Network: Not on file  Intimate Partner Violence: Not At Risk (06/25/2022)   Humiliation, Afraid, Rape, and Kick questionnaire    Fear of Current or Ex-Partner: No    Emotionally Abused: No    Physically Abused: No    Sexually Abused: No   Family History  Problem Relation Age of Onset   Heart failure Mother    Stroke Father 75       first one   Heart failure Sister    Cancer - Lung Sister     Current Outpatient Medications  Medication Sig Dispense Refill   albuterol (VENTOLIN HFA) 108 (90 Base) MCG/ACT inhaler Inhale 2 puffs into  the lungs every 6 (six) hours as needed for shortness of breath.     amLODipine (NORVASC) 10 MG tablet Take 1 tablet (10 mg total) by mouth daily. 30 tablet 0   aspirin EC 81 MG tablet Take 1 tablet (81 mg total) by mouth daily. Swallow whole. 90 tablet 3   atorvastatin (LIPITOR) 40 MG tablet Take 1 tablet (40 mg total) by mouth daily. 30 tablet 1   escitalopram (LEXAPRO) 20 MG tablet Take 20 mg by mouth at bedtime.     folic acid (FOLVITE) 1 MG tablet Take 1 tablet (1 mg total) by mouth daily. (Patient not taking: Reported on 04/15/2023) 30 tablet 0   hydrocortisone (ANUSOL-HC) 2.5 % rectal cream Place 1 Application rectally 2 (two) times daily as needed for hemorrhoids. Patient would like his medicines mailed to him (Patient taking differently: Place 1 Application rectally 2 (two) times daily as needed for hemorrhoids.)     labetalol (NORMODYNE) 100 MG tablet Take 1 tablet (100 mg  total) by mouth 2 (two) times daily. 60 tablet 3   Multiple Vitamin (MULTIVITAMIN WITH MINERALS) TABS tablet Take 1 tablet by mouth daily. (Patient not taking: Reported on 04/15/2023)     naloxone [facility]) nasal spray 4 mg/0.1 mL Place 1 spray into the nose See admin instructions. "INSTILL 1 SPRAY INTO ONE NOSTRIL AS DIRECTED FOR SUBSTANCE USE DISORDER FOR OPIOID OVERDOSE - CALL 911 IMMEDIATELY, ADMINISTER DOSE, THEN TURN PERSON ON SIDE - IF NO RESPONSE IN 2-3 MINUTES OR PERSON RESPONDS BUT RELAPSES, REPEAT USING A NEW SPRAY DEVICE AND SPRAY INTO THE OTHER NOSTRIL"     ondansetron (ZOFRAN) 4 MG tablet Take 1 tablet (4 mg total) by mouth every 8 (eight) hours as needed for nausea or vomiting. 20 tablet 0   pantoprazole (PROTONIX) 40 MG tablet Take 1 tablet (40 mg total) by mouth daily. 30 tablet 1   prazosin (MINIPRESS) 2 MG capsule Take 2 mg by mouth at bedtime.     QUEtiapine (SEROQUEL) 200 MG tablet Take 200 mg by mouth at bedtime.     Skin Protectants, Misc. (EUCERIN) cream Apply 1 Application topically 3 (three) times daily as needed (for skin irritation).     thiamine (VITAMIN B-1) 100 MG tablet Take 1 tablet (100 mg total) by mouth daily. (Patient not taking: Reported on 04/15/2023) 30 tablet 0   traZODone (DESYREL) 100 MG tablet Take 1 tablet (100 mg total) by mouth at bedtime as needed for sleep. (Patient taking differently: Take 100 mg by mouth at bedtime.) 30 tablet 0   witch hazel-glycerin (TUCKS) pad Apply 1 Application topically 4 (four) times daily as needed (irritaiton after bowel movement).     No current facility-administered medications for this visit.    No Known Allergies   REVIEW OF SYSTEMS:  [X]  denotes positive finding, [ ]  denotes negative finding Cardiac  Comments:  Chest pain or chest pressure:    Shortness of breath upon exertion:    Short of breath when lying flat:    Irregular heart rhythm:        Vascular    Pain in calf, thigh, or hip brought on by ambulation:     Pain in feet at night that wakes you up from your sleep:     Blood clot in your veins:    Leg swelling:         Pulmonary    Oxygen at home:    Productive cough:     Wheezing:  Neurologic    Sudden weakness in arms or legs:     Sudden numbness in arms or legs:     Sudden onset of difficulty speaking or slurred speech:    Temporary loss of vision in one eye:     Problems with dizziness:         Gastrointestinal    Blood in stool:     Vomited blood:         Genitourinary    Burning when urinating:     Blood in urine:        Psychiatric    Major depression:         Hematologic    Bleeding problems:    Problems with blood clotting too easily:        Skin    Rashes or ulcers:        Constitutional    Fever or chills:      PHYSICAL EXAMINATION:  There were no vitals filed for this visit.  General:  WDWN in NAD; vital signs documented above Gait: Not observed HENT: WNL, normocephalic Pulmonary: normal non-labored breathing , without wheezing Cardiac: regular HR,  Abdomen: soft, NT, no masses Skin: without rashes Vascular Exam/Pulses:  Right Left  Radial 2+ (normal) 2+ (normal)  Ulnar 2+ (normal) 2+ (normal)  Femoral 2+ (normal) 2+ (normal)  Popliteal    DP 2+ (normal) 2+ (normal)  PT 2+ (normal) 2+ (normal)   Extremities: without ischemic changes, without Gangrene , without cellulitis; without open wounds;  Musculoskeletal: no muscle wasting or atrophy  Neurologic: A&O X 3;  No focal weakness or paresthesias are detected Psychiatric:  The pt has Normal affect.   Non-Invasive Vascular Imaging:    CT angio chest abdomen pelvis from 06/24/2022 was reviewed.  This demonstrated significant decrease in aortic aneurysm size to 4.9 cm from 6.2 cm.  No endoleak was appreciated on that exam. Abdominal aortic ultrasound today was similar.    ASSESSMENT/PLAN: Angel Golden is a 63 y.o. male presenting status post 2023 EVAR for asymptomatic 6.2 cm infrarenal  abdominal aneurysm.  Overall, Angel Golden seems to be doing well from a vascular surgery standpoint.  Aortic aneurysm has decreased in size.  No endoleak. Denies symptoms of claudication, ischemic rest pain, tissue loss.  My plan is to see him on a yearly basis at this point.  I asked him to continue his current medication regimen which includes ASA/ Statin   Angel Sparrow, MD [facility] [phone]

## 2023-07-04 ENCOUNTER — Ambulatory Visit: Payer: No Typology Code available for payment source | Admitting: Vascular Surgery

## 2023-07-04 ENCOUNTER — Ambulatory Visit (HOSPITAL_COMMUNITY): Payer: No Typology Code available for payment source

## 2023-07-22 ENCOUNTER — Ambulatory Visit (HOSPITAL_COMMUNITY)
Admission: EM | Admit: 2023-07-22 | Discharge: 2023-07-25 | Disposition: A | Discharge status: Other Acute Inpatient Hospital | Payer: MEDICAID | Attending: Psychiatry | Admitting: Psychiatry

## 2023-07-22 DIAGNOSIS — R45850 Homicidal ideations: Secondary | ICD-10-CM | POA: Insufficient documentation

## 2023-07-22 DIAGNOSIS — F1910 Other psychoactive substance abuse, uncomplicated: Secondary | ICD-10-CM

## 2023-07-22 DIAGNOSIS — Z5900 Homelessness unspecified: Secondary | ICD-10-CM | POA: Insufficient documentation

## 2023-07-22 DIAGNOSIS — F1911 Other psychoactive substance abuse, in remission: Secondary | ICD-10-CM | POA: Insufficient documentation

## 2023-07-22 DIAGNOSIS — Z91148 Patient's other noncompliance with medication regimen for other reason: Secondary | ICD-10-CM | POA: Insufficient documentation
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
## 2023-07-23 DIAGNOSIS — R45850 Homicidal ideations: Secondary | ICD-10-CM | POA: Diagnosis not present

## 2023-07-23 DIAGNOSIS — F1911 Other psychoactive substance abuse, in remission: Secondary | ICD-10-CM | POA: Diagnosis not present

## 2023-07-23 DIAGNOSIS — Z91148 Patient's other noncompliance with medication regimen for other reason: Secondary | ICD-10-CM | POA: Diagnosis not present

## 2023-07-23 DIAGNOSIS — Z5900 Homelessness unspecified: Secondary | ICD-10-CM | POA: Diagnosis not present

## 2023-07-23 LAB — COMPREHENSIVE METABOLIC PANEL
ALT: 17 U/L (ref 0–44)
AST: 29 U/L (ref 15–41)
Albumin: 3.8 g/dL (ref 3.5–5.0)
Alkaline Phosphatase: 83 U/L (ref 38–126)
Anion gap: 13 (ref 5–15)
BUN: <5 mg/dL — ABNORMAL LOW (ref 8–23)
CO2: 21 mmol/L — ABNORMAL LOW (ref 22–32)
Calcium: 9.8 mg/dL (ref 8.9–10.3)
Chloride: 100 mmol/L (ref 98–111)
Creatinine, Ser: 0.86 mg/dL (ref 0.61–1.24)
GFR, Estimated: >60 mL/min (ref >60)
Glucose, Bld: 75 mg/dL (ref 70–99)
Potassium: 4.1 mmol/L (ref 3.5–5.1)
Sodium: 134 mmol/L — ABNORMAL LOW (ref 135–145)
Total Bilirubin: 0.8 mg/dL (ref 0.0–1.2)
Total Protein: 7.3 g/dL (ref 6.5–8.1)

## 2023-07-23 LAB — CBC WITH DIFFERENTIAL/PLATELET
Abs Immature Granulocytes: 0.02 10*3/uL (ref 0.00–0.07)
Basophils Absolute: 0 10*3/uL (ref 0.0–0.1)
Basophils Relative: 0 %
Eosinophils Absolute: 0.1 10*3/uL (ref 0.0–0.5)
Eosinophils Relative: 1 %
HCT: 44.1 % (ref 39.0–52.0)
Hemoglobin: 14.5 g/dL (ref 13.0–17.0)
Immature Granulocytes: 0 %
Lymphocytes Relative: 40 %
Lymphs Abs: 2.7 10*3/uL (ref 0.7–4.0)
MCH: 29.4 pg (ref 26.0–34.0)
MCHC: 32.9 g/dL (ref 30.0–36.0)
MCV: 89.3 fL (ref 80.0–100.0)
Monocytes Absolute: 0.5 10*3/uL (ref 0.1–1.0)
Monocytes Relative: 8 %
Neutro Abs: 3.4 10*3/uL (ref 1.7–7.7)
Neutrophils Relative %: 51 %
Platelets: 253 10*3/uL (ref 150–400)
RBC: 4.94 MIL/uL (ref 4.22–5.81)
RDW: 14 % (ref 11.5–15.5)
WBC: 6.7 10*3/uL (ref 4.0–10.5)
nRBC: 0 % (ref 0.0–0.2)

## 2023-07-23 LAB — POCT URINE DRUG SCREEN - MANUAL ENTRY (I-SCREEN)
POC Amphetamine UR: NOT DETECTED
POC Buprenorphine (BUP): NOT DETECTED
POC Cocaine UR: POSITIVE — AB
POC Marijuana UR: POSITIVE — AB
POC Methadone UR: NOT DETECTED
POC Methamphetamine UR: NOT DETECTED
POC Morphine: NOT DETECTED
POC Oxazepam (BZO): NOT DETECTED
POC Oxycodone UR: NOT DETECTED
POC Secobarbital (BAR): NOT DETECTED

## 2023-07-23 LAB — TSH: TSH: 1.229 u[IU]/mL (ref 0.350–4.500)

## 2023-07-23 LAB — ETHANOL: Alcohol, Ethyl (B): 53 mg/dL — ABNORMAL HIGH (ref <10)

## 2023-07-23 MED ORDER — OLANZAPINE 5 MG PO TBDP: 5.0000 mg | ORAL_TABLET | Freq: Three times a day (TID) | ORAL | Status: DC | PRN

## 2023-07-23 MED ORDER — OLANZAPINE 10 MG IM SOLR: 5.0000 mg | Freq: Three times a day (TID) | INTRAMUSCULAR | Status: DC | PRN

## 2023-07-23 MED ORDER — OLANZAPINE 10 MG IM SOLR: 10.0000 mg | Freq: Three times a day (TID) | INTRAMUSCULAR | Status: DC | PRN

## 2023-07-23 MED ORDER — MAGNESIUM HYDROXIDE 400 MG/5ML PO SUSP: 30.0000 mL | Freq: Every day | ORAL | Status: DC | PRN

## 2023-07-23 MED ORDER — ACETAMINOPHEN 325 MG PO TABS: 650.0000 mg | ORAL_TABLET | Freq: Four times a day (QID) | ORAL | Status: DC | PRN

## 2023-07-23 MED ORDER — ALUM & MAG HYDROXIDE-SIMETH 200-200-20 MG/5ML PO SUSP: 30.0000 mL | ORAL | Status: DC | PRN

## 2023-07-23 NOTE — ED Notes (Signed)
 Pt sleeping@this  time breathing even and unlabored will continue to monitor for safety
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
## 2023-07-23 NOTE — ED Notes (Addendum)
Pt sleeping at this time   Rise and fall of chest noted

## 2023-07-23 NOTE — ED Provider Notes (Signed)
 Behavioral Health Progress Note  Date and Time: 07/23/2023 11:02 AM Name: Angel Golden MRN:  [id number]  Subjective:  "I feel sluggish"  Diagnosis:  Final diagnoses:  Homicidal ideation  Polysubstance abuse [facility])  Homelessness unspecified   Angel Golden is a 63 year-old male admitted to Observation unit last night  with chief complaint of Homicidal ideations, substance use problem and homelessness. Patient reported to the evaluator that he wanted to hurt someone he had a altercation with in the streets. According to patient he wanted to shoot this guy. Pt denied access to guns. Pt is also homeless and when asked question he would become very agitated and at time refused to answer questions.   Per triage notes:  Pt presents to [facility] voluntarily, accompanied by [employer] due to homicidal ideation, with a plan to shoot an individual he got into an altercation with this evening. Pt denies access to weapon today, but reports he did have access to a gun last night. Pt reports that a verbal altercation escalated and he felt that he was not removed from the situation he would have caused harm to this individual. Pt reports diagnosis of PTSD and Schizophrenia. Pt reports he does not remember what medications he is prescribed, but he has not taken medications in about 2 weeks now. Pt reports prior suicide attempt a few years ago, where he attempted to overdose on medication. Pt currently denies SI,AVH.   Per chart review/health hx: Patient is a Cytogeneticist. Patient has had a couple of visits at [facility] ED. In December 2024, patient was seen by a provider at [facility] ED, complaining of feeling stressed, depressed and willing to kill anybody that gets his way and he will kill himself. He reported that he was tired of life. Patient reports that he was hospitalized at [facility] three months  prior to coming to ED but no records were available.  Patient reported using drugs, especially  alcohol (beer wine and liquor).  Patient reported he  was homeless and also reported he had run out of medications. UDS indicated presence of opioids, benzos, cocaine and marijuana.   Assessment: Face-to-face evaluation for this 63 year-old veteran male who is in bed awake. Hie is guarded and irritable.  He has poor eye contact  and keeps saying he wants to go to the [facility] hospital in [place]. Patient partially oriented. He is refusing medications. Patient is cursing  and reporting that if he has to be discharged, he is going to harm someone. He reports hearing voices telling him to "harm people". He admits to not taking medications and "I feel sluggish". Patient admits to using drugs every day and last use was yesterday. He admits to using alcohol. Patient admits to not being cooperative with treatment as evidenced by medication non-adherence, and not following up in outpatient services. He fails to contract for safety if he has to be discharged.  Patient reports not having any support system and he is currently homeless. Patient reports no medical concerns. He denies pain. Denies headache/dizziness. Denies respiratory distress. Denies chest/back/abdominal pain. Denies nausea/vomiting. Patient reports that "my life is all messed up, either you call the [place] or get off my face".  Patient reports he will not take any medications until he goes to [place]. He requested to be left alone and "if you give me any demn medicine, you will know who I am". Patient gets back in bed, covers himself, and refuses to communicate.   Patient remains irritable, angry, and endorses homicidal  thoughts. He continues to refuse medications.  I coordinated with TTS and SW  [place] for referral to [place].  We will continue to monitor for safety and to provide support.      Total Time spent with patient: 30 minutes  Past Psychiatric History: Schizophrenia, PTSD, depression, Substance abuse Past Medical History: NA Family History: NA Family Psychiatric  History: NA Social History:  Homeless  Additional Social History:    Pain Medications: see MAR Prescriptions: see MAR Over the Counter: see MAR History of alcohol / drug use?: No history of alcohol / drug abuse Longest period of sobriety (when/how long): n/a Negative Consequences of Use:  (n/a) Withdrawal Symptoms:  (n/a)                    Sleep: NA "I can't count while sleeping"  Appetite:  Poor  Current Medications:  Current Facility-Administered Medications  Medication Dose Route Frequency Provider Last Rate Last Admin   acetaminophen (TYLENOL) tablet 650 mg  650 mg Oral Q6H PRN Sindy Guadeloupe, NP       alum & mag hydroxide-simeth (MAALOX/MYLANTA) 200-200-20 MG/5ML suspension 30 mL  30 mL Oral Q4H PRN Sindy Guadeloupe, NP       magnesium hydroxide (MILK OF MAGNESIA) suspension 30 mL  30 mL Oral Daily PRN Sindy Guadeloupe, NP       OLANZapine (ZYPREXA) injection 10 mg  10 mg Intramuscular TID PRN Sindy Guadeloupe, NP       OLANZapine (ZYPREXA) injection 5 mg  5 mg Intramuscular TID PRN Sindy Guadeloupe, NP       OLANZapine zydis (ZYPREXA) disintegrating tablet 5 mg  5 mg Oral TID PRN Sindy Guadeloupe, NP       Current Outpatient Medications  Medication Sig Dispense Refill   QUEtiapine (SEROQUEL) 300 MG tablet Take 300 mg by mouth at bedtime.     traZODone (DESYREL) 50 MG tablet Take 50 mg by mouth at bedtime.     albuterol (VENTOLIN HFA) 108 (90 Base) MCG/ACT inhaler Inhale 2 puffs into the lungs every 6 (six) hours as needed for shortness of breath.     amLODipine (NORVASC) 10 MG tablet Take 1 tablet (10 mg total) by mouth daily. 30 tablet 0   atorvastatin (LIPITOR) 40 MG tablet Take 1 tablet (40 mg total) by mouth daily. 30 tablet 1   escitalopram (LEXAPRO) 20 MG tablet Take 20 mg by mouth at bedtime.     naloxone (NARCAN) nasal spray 4 mg/0.1 mL Place 1 spray into the nose See admin instructions. "INSTILL 1 SPRAY INTO ONE NOSTRIL AS DIRECTED FOR SUBSTANCE USE DISORDER FOR OPIOID OVERDOSE - CALL 911 IMMEDIATELY,  ADMINISTER DOSE, THEN TURN PERSON ON SIDE - IF NO RESPONSE IN 2-3 MINUTES OR PERSON RESPONDS BUT RELAPSES, REPEAT USING A NEW SPRAY DEVICE AND SPRAY INTO THE OTHER NOSTRIL"     pantoprazole (PROTONIX) 40 MG tablet Take 1 tablet (40 mg total) by mouth daily. 30 tablet 1   prazosin (MINIPRESS) 2 MG capsule Take 2 mg by mouth at bedtime.      Labs  Lab Results:  Admission on 07/22/2023  Component Date Value Ref Range Status   WBC 07/23/2023 6.7  4.0 - 10.5 K/uL Final   RBC 07/23/2023 4.94  4.22 - 5.81 MIL/uL Final   Hemoglobin 07/23/2023 14.5  13.0 - 17.0 g/dL Final   HCT 99/37/1696 44.1  39.0 - 52.0 % Final   MCV 07/23/2023 89.3  80.0 - 100.0 fL Final   MCH  07/23/2023 29.4  26.0 - 34.0 pg Final   MCHC 07/23/2023 32.9  30.0 - 36.0 g/dL Final   RDW 30/16/0109 14.0  11.5 - 15.5 % Final   Platelets 07/23/2023 253  150 - 400 K/uL Final   nRBC 07/23/2023 0.0  0.0 - 0.2 % Final   Neutrophils Relative % 07/23/2023 51  % Final   Neutro Abs 07/23/2023 3.4  1.7 - 7.7 K/uL Final   Lymphocytes Relative 07/23/2023 40  % Final   Lymphs Abs 07/23/2023 2.7  0.7 - 4.0 K/uL Final   Monocytes Relative 07/23/2023 8  % Final   Monocytes Absolute 07/23/2023 0.5  0.1 - 1.0 K/uL Final   Eosinophils Relative 07/23/2023 1  % Final   Eosinophils Absolute 07/23/2023 0.1  0.0 - 0.5 K/uL Final   Basophils Relative 07/23/2023 0  % Final   Basophils Absolute 07/23/2023 0.0  0.0 - 0.1 K/uL Final   Immature Granulocytes 07/23/2023 0  % Final   Abs Immature Granulocytes 07/23/2023 0.02  0.00 - 0.07 K/uL Final   Performed at [facility], [address], [place], [place] [address]   Sodium 07/23/2023 134 (L)  135 - 145 mmol/L Final   Potassium 07/23/2023 4.1  3.5 - 5.1 mmol/L Final   Chloride 07/23/2023 100  98 - 111 mmol/L Final   CO2 07/23/2023 21 (L)  22 - 32 mmol/L Final   Glucose, Bld 07/23/2023 75  70 - 99 mg/dL Final   Glucose reference range applies only to samples taken after fasting for at least 8  hours.   BUN 07/23/2023 <5 (L)  8 - 23 mg/dL Final   Creatinine, Ser 07/23/2023 0.86  0.61 - 1.24 mg/dL Final   Calcium 73/22/0254 9.8  8.9 - 10.3 mg/dL Final   Total Protein 27/10/2374 7.3  6.5 - 8.1 g/dL Final   Albumin 28/31/5176 3.8  3.5 - 5.0 g/dL Final   AST 16/11/3708 29  15 - 41 U/L Final   ALT 07/23/2023 17  0 - 44 U/L Final   Alkaline Phosphatase 07/23/2023 83  38 - 126 U/L Final   Total Bilirubin 07/23/2023 0.8  0.0 - 1.2 mg/dL Final   GFR, Estimated 07/23/2023 >60  >60 mL/min Final   Comment: (NOTE) Calculated using the CKD-EPI Creatinine Equation (2021)    Anion gap 07/23/2023 13  5 - 15 Final   Performed at [facility], [address], [place], [place] [address]   Alcohol, Ethyl (B) 07/23/2023 53 (H)  <10 mg/dL Final   Comment: (NOTE) Lowest detectable limit for serum alcohol is 10 mg/dL.  For medical purposes only. Performed at [facility], [address] [place], [place] [address]    TSH 07/23/2023 1.229  0.350 - 4.500 uIU/mL Final   Comment: Performed by a 3rd Generation assay with a functional sensitivity of <=0.01 uIU/mL. Performed at [facility], [address] [place], [place] [address]   Admission on 04/15/2023, Discharged on 04/17/2023  Component Date Value Ref Range Status   Sodium 04/15/2023 137  135 - 145 mmol/L Final   Potassium 04/15/2023 3.6  3.5 - 5.1 mmol/L Final   Chloride 04/15/2023 105  98 - 111 mmol/L Final   CO2 04/15/2023 27  22 - 32 mmol/L Final   Glucose, Bld 04/15/2023 133 (H)  70 - 99 mg/dL Final   Glucose reference range applies only to samples taken after fasting for at least 8 hours.   BUN 04/15/2023  9  8 - 23 mg/dL Final   Creatinine, Ser 04/15/2023 1.00  0.61 - 1.24 mg/dL Final   Calcium 16/02/9603 9.3  8.9 - 10.3 mg/dL Final   Total Protein 54/01/8118 8.0  6.5 - 8.1 g/dL Final   Albumin 14/78/2956 4.4  3.5 - 5.0 g/dL Final   AST 21/30/8657 28  15 - 41 U/L Final   ALT 04/15/2023 16  0 - 44 U/L Final    Alkaline Phosphatase 04/15/2023 103  38 - 126 U/L Final   Total Bilirubin 04/15/2023 1.1  <1.2 mg/dL Final   GFR, Estimated 04/15/2023 >60  >60 mL/min Final   Comment: (NOTE) Calculated using the CKD-EPI Creatinine Equation (2021)    Anion gap 04/15/2023 5  5 - 15 Final   Performed at [facility], [address], [place], [place] [address]   Alcohol, Ethyl (B) 04/15/2023 <10  <10 mg/dL Final   Comment: (NOTE) Lowest detectable limit for serum alcohol is 10 mg/dL.  For medical purposes only. Performed at [facility], [address] [place], [place] [address]    Salicylate Lvl 04/15/2023 <7.0 (L)  7.0 - 30.0 mg/dL Final   Performed at [facility], [address], [place], [place] [address]   Acetaminophen (Tylenol), Serum 04/15/2023 <10 (L)  10 - 30 ug/mL Final   Comment: (NOTE) Therapeutic concentrations vary significantly. A range of 10-30 ug/mL  may be an effective concentration for many patients. However, some  are best treated at concentrations outside of this range. Acetaminophen concentrations >150 ug/mL at 4 hours after ingestion  and >50 ug/mL at 12 hours after ingestion are often associated with  toxic reactions.  Performed at [facility], [address] [place], [place] [address]    WBC 04/15/2023 5.0  4.0 - 10.5 K/uL Final   RBC 04/15/2023 5.15  4.22 - 5.81 MIL/uL Final   Hemoglobin 04/15/2023 15.0  13.0 - 17.0 g/dL Final   HCT 72/53/6644 46.8  39.0 - 52.0 % Final   MCV 04/15/2023 90.9  80.0 - 100.0 fL Final   MCH 04/15/2023 29.1  26.0 - 34.0 pg Final   MCHC 04/15/2023 32.1  30.0 - 36.0 g/dL Final   RDW 03/47/4259 15.2  11.5 - 15.5 % Final   Platelets 04/15/2023 216  150 - 400 K/uL Final   nRBC 04/15/2023 0.0  0.0 - 0.2 % Final   Performed at [facility], [address], [place], [place] [address]   SARS Coronavirus 2 by RT PCR 04/16/2023 NEGATIVE  NEGATIVE Final    Comment: (NOTE) SARS-CoV-2 target nucleic acids are NOT DETECTED.  The SARS-CoV-2 RNA is generally detectable in upper and lower respiratory specimens during the acute phase of infection. The lowest concentration of SARS-CoV-2 viral copies this assay can detect is 250 copies / mL. A negative result does not preclude SARS-CoV-2 infection and should not be used as the sole basis for treatment or other patient management decisions.  A negative result may occur with improper specimen collection / handling, submission of specimen other than nasopharyngeal swab, presence of viral mutation(s) within the areas targeted by this assay, and inadequate number of viral copies (<250 copies / mL). A negative result must be combined with clinical observations, patient history, and epidemiological information.  Fact Sheet for Patients:   RoadLapTop.co.za  Fact Sheet for Healthcare Providers: http://kim-miller.com/  This test is not yet approved or  cleared by the [place] and has been authorized for detection and/or diagnosis of SARS-CoV-2 by FDA under an Emergency Use Authorization (EUA).  This EUA will remain in effect (meaning this test can be used) for the duration of the COVID-19 declaration under Section 564(b)(1) of the Act, 21 U.S.C. section 360bbb-3(b)(1), unless the authorization is terminated or revoked sooner.  Performed at [facility], [address], [place], [place] [address]    Opiates 04/16/2023 NONE DETECTED  NONE DETECTED Final   Cocaine 04/16/2023 POSITIVE (A)  NONE DETECTED Final   Benzodiazepines 04/16/2023 NONE DETECTED  NONE DETECTED Final   Amphetamines 04/16/2023 NONE DETECTED  NONE DETECTED Final   Tetrahydrocannabinol 04/16/2023 POSITIVE (A)  NONE DETECTED Final   Barbiturates 04/16/2023 NONE DETECTED  NONE DETECTED Final   Comment: (NOTE) DRUG SCREEN FOR MEDICAL  PURPOSES ONLY.  IF CONFIRMATION IS NEEDED FOR ANY PURPOSE, NOTIFY LAB WITHIN 5 DAYS.  LOWEST DETECTABLE LIMITS FOR URINE DRUG SCREEN Drug Class                     Cutoff (ng/mL) Amphetamine and metabolites    1000 Barbiturate and metabolites    200 Benzodiazepine                 200 Opiates and metabolites        300 Cocaine and metabolites        300 THC                            50 Performed at [facility], [address], [place], [place] [address]     Blood Alcohol level:  Lab Results  Component Value Date   ETH 53 (H) 07/23/2023   ETH <10 04/15/2023    Metabolic Disorder Labs: Lab Results  Component Value Date   HGBA1C 5.7 (H) 12/04/2021   MPG 116.89 12/04/2021   MPG 108.28 05/09/2020   No results found for: "PROLACTIN" Lab Results  Component Value Date   CHOL 225 (H) 12/04/2021   TRIG 72 05/08/2022   HDL 54 12/04/2021   CHOLHDL 4.2 12/04/2021   VLDL 13 12/04/2021   LDLCALC 158 (H) 12/04/2021   LDLCALC 147 (H) 05/09/2020    Therapeutic Lab Levels: No results found for: "LITHIUM" No results found for: "VALPROATE" No results found for: "CBMZ"  Physical Findings   AUDIT    Flowsheet Row [facility]) [facility]  Alcohol Use Disorder Identification Test Final Score (AUDIT) 17 38      PHQ2-9    Flowsheet Row ED from 07/22/2023 in [facility]  PHQ-2 Total Score 2  PHQ-9 Total Score 6      Flowsheet Row ED from 07/22/2023 in [facility] ED from 04/15/2023 in [facility] Emergency Department at [facility] ED to Hosp-Admission (Discharged) from 06/24/2022 in [facility]  C-SSRS RISK CATEGORY Moderate Risk Moderate Risk No Risk        Musculoskeletal  Strength & Muscle Tone: within normal  limits Gait & Station: normal Patient leans: N/A  Psychiatric Specialty Exam  Presentation  General Appearance:  Casual  Eye Contact: Poor  Speech: Pressured  Speech Volume: Increased  Handedness: Right   Mood and Affect  Mood: Angry; Anxious; Irritable  Affect:  Constricted   Thought Process  Thought Processes: Coherent  Descriptions of Associations:Intact  Orientation:Full (Time, Place and Person)  Thought Content:Illogical  Diagnosis of Schizophrenia or Schizoaffective disorder in past: Yes    Hallucinations:Hallucinations: None  Ideas of Reference:None  Suicidal Thoughts:Suicidal Thoughts: No  Homicidal Thoughts:Homicidal Thoughts: Yes, Passive HI Active Intent and/or Plan: Without Intent   Sensorium  Memory: Immediate Fair; Recent Fair; Remote Fair  Judgment: Poor  Insight: Poor   Executive Functions  Concentration: Poor  Attention Span: Poor  Recall: [employer] of Knowledge: Fair  Language: Fair   Psychomotor Activity  Psychomotor Activity: Psychomotor Activity: Restlessness   Assets  Assets: Manufacturing systems engineer; Desire for Improvement   Sleep  Sleep: Sleep: Fair Number of Hours of Sleep: 0 ("I can't count while sleeping")   Nutritional Assessment (For OBS and FBC admissions only) Has the patient had a weight loss or gain of 10 pounds or more in the last 3 months?: No Has the patient had a decrease in food intake/or appetite?: No Does the patient have dental problems?: No Does the patient have eating habits or behaviors that may be indicators of an eating disorder including binging or inducing vomiting?: No Has the patient recently lost weight without trying?: 0 Has the patient been eating poorly because of a decreased appetite?: 0 Malnutrition Screening Tool Score: 0    Physical Exam  Physical Exam HENT:     Head: Normocephalic and atraumatic.     Right Ear: Tympanic membrane normal.     Left Ear:  Tympanic membrane normal.     Nose: Nose normal.     Mouth/Throat:     Mouth: Mucous membranes are moist.  Eyes:     Extraocular Movements: Extraocular movements intact.     Pupils: Pupils are equal, round, and reactive to light.  Cardiovascular:     Rate and Rhythm: Normal rate.  Pulmonary:     Effort: Pulmonary effort is normal.  Musculoskeletal:        General: Normal range of motion.     Cervical back: Normal range of motion and neck supple.  Neurological:     General: No focal deficit present.     Mental Status: He is alert and oriented to person, place, and time.    Review of Systems  Constitutional: Negative.   HENT: Negative.    Eyes: Negative.   Respiratory: Negative.    Cardiovascular: Negative.   Gastrointestinal: Negative.   Genitourinary: Negative.   Musculoskeletal: Negative.   Skin: Negative.   Neurological: Negative.   Endo/Heme/Allergies: Negative.   Psychiatric/Behavioral:  Positive for depression and substance abuse. The patient is nervous/anxious.    Blood pressure 107/76, pulse 94, temperature 98.5 F (36.9 C), temperature source Oral, resp. rate 16, SpO2 97%. There is no height or weight on file to calculate BMI.  Treatment Plan Summary: Daily contact with patient to assess and evaluate symptoms and progress in treatment, Medication management, and Plan transfer for inpatient.   Olin Pia, NP 07/23/2023 11:02 AM

## 2023-07-23 NOTE — ED Notes (Signed)
Pt sleeping at this time   Rise and fall of chest noted

## 2023-07-23 NOTE — ED Notes (Signed)
 Patient is resting on the recliner with eyes closed without any distress and is wearing his personal clothing. He endorses SI without plan and reports HI towards no one in here. He reports anxiety and depression 10 and denies AVH. He reports having SIHI and anxiety and depression all the time. He reports sleeping okay without having night mares. Staff will continue to monitor safety and for changes in condition.

## 2023-07-24 DIAGNOSIS — R45850 Homicidal ideations: Secondary | ICD-10-CM

## 2023-07-24 LAB — POC SARS CORONAVIRUS 2 AG: SARSCOV2ONAVIRUS 2 AG: NEGATIVE

## 2023-07-24 MED ORDER — LORAZEPAM 1 MG PO TABS: 1.0000 mg | ORAL_TABLET | Freq: Four times a day (QID) | ORAL | Status: DC | PRN

## 2023-07-24 MED ORDER — TRAZODONE HCL 50 MG PO TABS
50.0000 mg | ORAL_TABLET | Freq: Every day | ORAL | Status: DC
  Administered 2023-07-24: 50 mg via ORAL
  Filled 2023-07-24: qty 1

## 2023-07-24 MED ORDER — ALBUTEROL SULFATE HFA 108 (90 BASE) MCG/ACT IN AERS: 2.0000 | INHALATION_SPRAY | Freq: Four times a day (QID) | RESPIRATORY_TRACT | Status: DC | PRN

## 2023-07-24 MED ORDER — ESCITALOPRAM OXALATE 10 MG PO TABS
20.0000 mg | ORAL_TABLET | Freq: Every day | ORAL | Status: DC
  Administered 2023-07-24: 20 mg via ORAL
  Filled 2023-07-24: qty 2

## 2023-07-24 MED ORDER — PRAZOSIN HCL 2 MG PO CAPS
2.0000 mg | ORAL_CAPSULE | Freq: Every day | ORAL | Status: DC
  Administered 2023-07-24: 2 mg via ORAL
  Filled 2023-07-24: qty 1

## 2023-07-24 MED ORDER — AMLODIPINE BESYLATE 10 MG PO TABS
10.0000 mg | ORAL_TABLET | Freq: Every day | ORAL | Status: DC
  Administered 2023-07-24: 10 mg via ORAL
  Filled 2023-07-24: qty 1

## 2023-07-24 MED ORDER — ONDANSETRON 4 MG PO TBDP: 4.0000 mg | ORAL_TABLET | Freq: Four times a day (QID) | ORAL | Status: DC | PRN

## 2023-07-24 MED ORDER — THIAMINE MONONITRATE 100 MG PO TABS: 100.0000 mg | ORAL_TABLET | Freq: Every day | ORAL | Status: DC

## 2023-07-24 MED ORDER — LOPERAMIDE HCL 2 MG PO CAPS: 2.0000 mg | ORAL_CAPSULE | ORAL | Status: DC | PRN

## 2023-07-24 MED ORDER — ATORVASTATIN CALCIUM 40 MG PO TABS
40.0000 mg | ORAL_TABLET | Freq: Every day | ORAL | Status: DC
  Administered 2023-07-24: 40 mg via ORAL
  Filled 2023-07-24: qty 1

## 2023-07-24 MED ORDER — QUETIAPINE FUMARATE 300 MG PO TABS
300.0000 mg | ORAL_TABLET | Freq: Every day | ORAL | Status: DC
  Administered 2023-07-24: 300 mg via ORAL
  Filled 2023-07-24: qty 1

## 2023-07-24 MED ORDER — HYDROXYZINE HCL 25 MG PO TABS: 25.0000 mg | ORAL_TABLET | Freq: Four times a day (QID) | ORAL | Status: DC | PRN

## 2023-07-24 MED ORDER — ADULT MULTIVITAMIN W/MINERALS CH
1.0000 | ORAL_TABLET | Freq: Every day | ORAL | Status: DC
  Administered 2023-07-24: 1 via ORAL
  Filled 2023-07-24: qty 1

## 2023-07-24 NOTE — ED Notes (Signed)
 Patient is resting with eyes closed without any distress. Staff will continue to monitor per safety protocol and for changes in condition.

## 2023-07-24 NOTE — Progress Notes (Signed)
 Pt has been accepted to [facility] on 07/24/2023 assignment: 134 BED 2 BUILDING 8   Pt meets inpatient criteria per: Starleen Blue NP  Attending Physician will be: Ann Lions MD/ Sherol Dade NP  Report can be called to: [phone]  [id number] or [id number]  Pt can arrive after ASAP   Care Team Notified: Starleen Blue NP,  Randi McCombs RN,Veerainder Enedina Finner MD, Roseanne Reno RN, Olin Pia NP    [place] Shaquavia Whisonant LCSW-A   07/24/2023 4:03 PM

## 2023-07-24 NOTE — ED Notes (Signed)
 Pt is sleeping@this  time breathing even and unlabored no pain or distress noted will continue to monitor for safety

## 2023-07-24 NOTE — ED Notes (Signed)
 Patient behavior better under controlled. Less irritated when received consistent updates to his placement status with the [place].  Currently Pt has been accepted to [facility], [place] and transportation has been arranged. Pt updated on status and he offered appreciation to staff. Will continue to monitor and report changes as noted.

## 2023-07-24 NOTE — ED Notes (Signed)
 Patient provided with update on his transportation status and provided with meal. Patient resting calmly. No s/s of current distress.

## 2023-07-24 NOTE — ED Notes (Signed)
 Patient is resting in recliner bed with eyes closed without any distress noted. Patient will continue to be monitor for safety per protocol and for changes in condition.

## 2023-07-24 NOTE — ED Provider Notes (Addendum)
 Behavioral Health Progress Note  Date and Time: 07/24/2023 3:08 PM Name: Angel Golden MRN:  [id number]  HPI: Angel Golden, 63 y/o male with a history of homelessness, PTSD, MDD and polysubstance abuse who presented to [facility] via [facility] on 3/10 with complaints of HI with a plan to use a gun to shoot person with whom he had a verbal altercation. He reported having access to a gun.  Today's Patient assessment: On assessment today, patient is continuing to endorse suicidal ideations, denies having a current plan. He endorses auditory hallucinations of voices which are, commanding in nature, and when Clinical research associate asked what the voices were commanding him to do, he stated: "think about it for a hot minute. It will come to you." He reports visual hallucinations of his mother who passed away 3 years ago, states that he talks to her all the time. Mood is dysphoric, irritable & depressed, affect is congruent. Pt denies paranoia, denies HI currently, but states: "If someone comes at me, they dead."  Pt reports a poor sleep quality, reports a fair appetite, reports being off his mental health medications "for a good while", and is a poor historian and not really able to tell writer how long he has been without the medications. He states that he does not remember past meds that have been effective, and verbalizes his motivation to go to the [facility] inpatient hospital, with a goal of treating and stabilizing his mental health.  Attention to personal hygiene and grooming is poor, appears disheveled, and unkept, seems not to be tending to personal hygiene and grooming. Pt encouraged to shower by nursing, but refused. He denies being in any physical pain today.  Patient is continuing to meet criteria for inpatient hospitalization, and is being referred to the [place] by CSW. We are continuing current medication regimen while [facility].  Home meds restarted: -Abuterol PRN for  wheezing/SOB -Prazosin 2 mg nightly for nightmares -Seroquel 300 mg nightly for mood stabilization -Trazodone 50 mg nightly for sleep -Lipitor 40 mg daily for hyperlipidemia -Norvasc 10 mg daily for hypertension  Diagnosis:  Final diagnoses:  Homicidal ideation  Polysubstance abuse (HCC)  Homelessness unspecified    Total Time spent with patient: 45 minutes  Additional Social History:    Pain Medications: see MAR Prescriptions: see MAR Over the Counter: see MAR History of alcohol / drug use?: No history of alcohol / drug abuse Longest period of sobriety (when/how long): n/a Negative Consequences of Use:  (n/a) Withdrawal Symptoms:  (n/a)   Sleep: Fair  Appetite:  Fair  Current Medications:  Current Facility-Administered Medications  Medication Dose Route Frequency Provider Last Rate Last Admin   acetaminophen (TYLENOL) tablet 650 mg  650 mg Oral Q6H PRN Sindy Guadeloupe, NP       albuterol (VENTOLIN HFA) 108 (90 Base) MCG/ACT inhaler 2 puff  2 puff Inhalation Q6H PRN Starleen Blue, NP       alum & mag hydroxide-simeth (MAALOX/MYLANTA) 200-200-20 MG/5ML suspension 30 mL  30 mL Oral Q4H PRN Sindy Guadeloupe, NP       amLODipine (NORVASC) tablet 10 mg  10 mg Oral Daily Orley Lawry, NP       atorvastatin (LIPITOR) tablet 40 mg  40 mg Oral Daily Jefry Lesinski, NP       escitalopram (LEXAPRO) tablet 20 mg  20 mg Oral QHS Krayton Wortley, NP       hydrOXYzine (ATARAX) tablet 25 mg  25  mg Oral Q6H PRN Starleen Blue, NP       loperamide (IMODIUM) capsule 2-4 mg  2-4 mg Oral PRN Cardarius Senat, NP       LORazepam (ATIVAN) tablet 1 mg  1 mg Oral Q6H PRN Birgit Nowling, NP       magnesium hydroxide (MILK OF MAGNESIA) suspension 30 mL  30 mL Oral Daily PRN Sindy Guadeloupe, NP       multivitamin with minerals tablet 1 tablet  1 tablet Oral Daily Rickiya Picariello, NP       OLANZapine (ZYPREXA) injection 10 mg  10 mg Intramuscular TID PRN Sindy Guadeloupe, NP       OLANZapine (ZYPREXA) injection  5 mg  5 mg Intramuscular TID PRN Sindy Guadeloupe, NP       OLANZapine zydis (ZYPREXA) disintegrating tablet 5 mg  5 mg Oral TID PRN Sindy Guadeloupe, NP       ondansetron (ZOFRAN-ODT) disintegrating tablet 4 mg  4 mg Oral Q6H PRN Starleen Blue, NP       prazosin (MINIPRESS) capsule 2 mg  2 mg Oral QHS Kyndall Amero, NP       QUEtiapine (SEROQUEL) tablet 300 mg  300 mg Oral QHS Starleen Blue, NP       [START ON 07/25/2023] thiamine (VITAMIN B1) tablet 100 mg  100 mg Oral Daily Aeva Posey, NP       traZODone (DESYREL) tablet 50 mg  50 mg Oral QHS Starleen Blue, NP       Current Outpatient Medications  Medication Sig Dispense Refill   QUEtiapine (SEROQUEL) 300 MG tablet Take 300 mg by mouth at bedtime.     traZODone (DESYREL) 50 MG tablet Take 50 mg by mouth at bedtime.     albuterol (VENTOLIN HFA) 108 (90 Base) MCG/ACT inhaler Inhale 2 puffs into the lungs every 6 (six) hours as needed for shortness of breath.     amLODipine (NORVASC) 10 MG tablet Take 1 tablet (10 mg total) by mouth daily. 30 tablet 0   atorvastatin (LIPITOR) 40 MG tablet Take 1 tablet (40 mg total) by mouth daily. 30 tablet 1   escitalopram (LEXAPRO) 20 MG tablet Take 20 mg by mouth at bedtime.     naloxone (NARCAN) nasal spray 4 mg/0.1 mL Place 1 spray into the nose See admin instructions. "INSTILL 1 SPRAY INTO ONE NOSTRIL AS DIRECTED FOR SUBSTANCE USE DISORDER FOR OPIOID OVERDOSE - CALL 911 IMMEDIATELY, ADMINISTER DOSE, THEN TURN PERSON ON SIDE - IF NO RESPONSE IN 2-3 MINUTES OR PERSON RESPONDS BUT RELAPSES, REPEAT USING A NEW SPRAY DEVICE AND SPRAY INTO THE OTHER NOSTRIL"     pantoprazole (PROTONIX) 40 MG tablet Take 1 tablet (40 mg total) by mouth daily. 30 tablet 1   prazosin (MINIPRESS) 2 MG capsule Take 2 mg by mouth at bedtime.      Labs  Lab Results:  Admission on 07/22/2023  Component Date Value Ref Range Status   WBC 07/23/2023 6.7  4.0 - 10.5 K/uL Final   RBC 07/23/2023 4.94  4.22 - 5.81 MIL/uL Final    Hemoglobin 07/23/2023 14.5  13.0 - 17.0 g/dL Final   HCT 78/29/5621 44.1  39.0 - 52.0 % Final   MCV 07/23/2023 89.3  80.0 - 100.0 fL Final   MCH 07/23/2023 29.4  26.0 - 34.0 pg Final   MCHC 07/23/2023 32.9  30.0 - 36.0 g/dL Final   RDW 30/86/5784 14.0  11.5 - 15.5 % Final   Platelets 07/23/2023 253  150 -  400 K/uL Final   nRBC 07/23/2023 0.0  0.0 - 0.2 % Final   Neutrophils Relative % 07/23/2023 51  % Final   Neutro Abs 07/23/2023 3.4  1.7 - 7.7 K/uL Final   Lymphocytes Relative 07/23/2023 40  % Final   Lymphs Abs 07/23/2023 2.7  0.7 - 4.0 K/uL Final   Monocytes Relative 07/23/2023 8  % Final   Monocytes Absolute 07/23/2023 0.5  0.1 - 1.0 K/uL Final   Eosinophils Relative 07/23/2023 1  % Final   Eosinophils Absolute 07/23/2023 0.1  0.0 - 0.5 K/uL Final   Basophils Relative 07/23/2023 0  % Final   Basophils Absolute 07/23/2023 0.0  0.0 - 0.1 K/uL Final   Immature Granulocytes 07/23/2023 0  % Final   Abs Immature Granulocytes 07/23/2023 0.02  0.00 - 0.07 K/uL Final   Performed at [facility], [address], [place], [place] [address]   Sodium 07/23/2023 134 (L)  135 - 145 mmol/L Final   Potassium 07/23/2023 4.1  3.5 - 5.1 mmol/L Final   Chloride 07/23/2023 100  98 - 111 mmol/L Final   CO2 07/23/2023 21 (L)  22 - 32 mmol/L Final   Glucose, Bld 07/23/2023 75  70 - 99 mg/dL Final   Glucose reference range applies only to samples taken after fasting for at least 8 hours.   BUN 07/23/2023 <5 (L)  8 - 23 mg/dL Final   Creatinine, Ser 07/23/2023 0.86  0.61 - 1.24 mg/dL Final   Calcium 60/45/4098 9.8  8.9 - 10.3 mg/dL Final   Total Protein 11/91/4782 7.3  6.5 - 8.1 g/dL Final   Albumin 95/62/1308 3.8  3.5 - 5.0 g/dL Final   AST 65/78/4696 29  15 - 41 U/L Final   ALT 07/23/2023 17  0 - 44 U/L Final   Alkaline Phosphatase 07/23/2023 83  38 - 126 U/L Final   Total Bilirubin 07/23/2023 0.8  0.0 - 1.2 mg/dL Final   GFR, Estimated 07/23/2023 >60  >60 mL/min Final   Comment:  (NOTE) Calculated using the CKD-EPI Creatinine Equation (2021)    Anion gap 07/23/2023 13  5 - 15 Final   Performed at [facility], [address], [place], [place] [address]   Alcohol, Ethyl (B) 07/23/2023 53 (H)  <10 mg/dL Final   Comment: (NOTE) Lowest detectable limit for serum alcohol is 10 mg/dL.  For medical purposes only. Performed at [facility], [address] [place], [place] [address]    TSH 07/23/2023 1.229  0.350 - 4.500 uIU/mL Final   Comment: Performed by a 3rd Generation assay with a functional sensitivity of <=0.01 uIU/mL. Performed at [facility], [address] [place], [place] [address]    POC Amphetamine UR 07/23/2023 None Detected  NONE DETECTED (Cut Off Level 1000 ng/mL) Final   POC Secobarbital (BAR) 07/23/2023 None Detected  NONE DETECTED (Cut Off Level 300 ng/mL) Final   POC Buprenorphine (BUP) 07/23/2023 None Detected  NONE DETECTED (Cut Off Level 10 ng/mL) Final   POC Oxazepam (BZO) 07/23/2023 None Detected  NONE DETECTED (Cut Off Level 300 ng/mL) Final   POC Cocaine UR 07/23/2023 Positive (A)  NONE DETECTED (Cut Off Level 300 ng/mL) Final   POC Methamphetamine UR 07/23/2023 None Detected  NONE DETECTED (Cut Off Level 1000 ng/mL) Final   POC Morphine 07/23/2023 None Detected  NONE DETECTED (Cut Off Level 300 ng/mL) Final   POC Methadone UR 07/23/2023 None Detected  NONE DETECTED (Cut  Off Level 300 ng/mL) Final   POC Oxycodone UR 07/23/2023 None Detected  NONE DETECTED (Cut Off Level 100 ng/mL) Final   POC Marijuana UR 07/23/2023 Positive (A)  NONE DETECTED (Cut Off Level 50 ng/mL) Final   SARSCOV2ONAVIRUS 2 AG 07/24/2023 NEGATIVE  NEGATIVE Final   Comment: (NOTE) SARS-CoV-2 antigen NOT DETECTED.   Negative results are presumptive.  Negative results do not preclude SARS-CoV-2 infection and should not be used as the sole basis for treatment or other patient management decisions, including infection  control decisions, particularly in  the presence of clinical signs and  symptoms consistent with COVID-19, or in those who have been in contact with the virus.  Negative results must be combined with clinical observations, patient history, and epidemiological information. The expected result is Negative.  Fact Sheet for Patients: https://www.jennings-kim.com/  Fact Sheet for Healthcare Providers: https://alexander-rogers.biz/  This test is not yet approved or cleared by the [place] FDA and  has been authorized for detection and/or diagnosis of SARS-CoV-2 by FDA under an Emergency Use Authorization (EUA).  This EUA will remain in effect (meaning this test can be used) for the duration of  the COV                          ID-19 declaration under Section 564(b)(1) of the Act, 21 U.S.C. section 360bbb-3(b)(1), unless the authorization is terminated or revoked sooner.    Admission on 04/15/2023, Discharged on 04/17/2023  Component Date Value Ref Range Status   Sodium 04/15/2023 137  135 - 145 mmol/L Final   Potassium 04/15/2023 3.6  3.5 - 5.1 mmol/L Final   Chloride 04/15/2023 105  98 - 111 mmol/L Final   CO2 04/15/2023 27  22 - 32 mmol/L Final   Glucose, Bld 04/15/2023 133 (H)  70 - 99 mg/dL Final   Glucose reference range applies only to samples taken after fasting for at least 8 hours.   BUN 04/15/2023 9  8 - 23 mg/dL Final   Creatinine, Ser 04/15/2023 1.00  0.61 - 1.24 mg/dL Final   Calcium 16/02/9603 9.3  8.9 - 10.3 mg/dL Final   Total Protein 54/01/8118 8.0  6.5 - 8.1 g/dL Final   Albumin 14/78/2956 4.4  3.5 - 5.0 g/dL Final   AST 21/30/8657 28  15 - 41 U/L Final   ALT 04/15/2023 16  0 - 44 U/L Final   Alkaline Phosphatase 04/15/2023 103  38 - 126 U/L Final   Total Bilirubin 04/15/2023 1.1  <1.2 mg/dL Final   GFR, Estimated 04/15/2023 >60  >60 mL/min Final   Comment: (NOTE) Calculated using the CKD-EPI Creatinine Equation (2021)    Anion gap 04/15/2023 5  5 - 15 Final    Performed at [facility], [address] [place], [place] [address]   Alcohol, Ethyl (B) 04/15/2023 <10  <10 mg/dL Final   Comment: (NOTE) Lowest detectable limit for serum alcohol is 10 mg/dL.  For medical purposes only. Performed at [facility], [address] [place], [place] [address]    Salicylate Lvl 04/15/2023 <7.0 (L)  7.0 - 30.0 mg/dL Final   Performed at [facility], [address], [place], [place] [address]   Acetaminophen (Tylenol), Serum 04/15/2023 <10 (L)  10 - 30 ug/mL Final   Comment: (NOTE) Therapeutic concentrations vary significantly. A range of 10-30 ug/mL  may be an effective concentration for many patients. However, some  are best treated  at concentrations outside of this range. Acetaminophen concentrations >150 ug/mL at 4 hours after ingestion  and >50 ug/mL at 12 hours after ingestion are often associated with  toxic reactions.  Performed at [facility], [address] [place], [place] [address]    WBC 04/15/2023 5.0  4.0 - 10.5 K/uL Final   RBC 04/15/2023 5.15  4.22 - 5.81 MIL/uL Final   Hemoglobin 04/15/2023 15.0  13.0 - 17.0 g/dL Final   HCT 56/21/3086 46.8  39.0 - 52.0 % Final   MCV 04/15/2023 90.9  80.0 - 100.0 fL Final   MCH 04/15/2023 29.1  26.0 - 34.0 pg Final   MCHC 04/15/2023 32.1  30.0 - 36.0 g/dL Final   RDW 57/84/6962 15.2  11.5 - 15.5 % Final   Platelets 04/15/2023 216  150 - 400 K/uL Final   nRBC 04/15/2023 0.0  0.0 - 0.2 % Final   Performed at [facility], [address] [place], [place] [address]   SARS Coronavirus 2 by RT PCR 04/16/2023 NEGATIVE  NEGATIVE Final   Comment: (NOTE) SARS-CoV-2 target nucleic acids are NOT DETECTED.  The SARS-CoV-2 RNA is generally detectable in upper and lower respiratory specimens during the acute phase of infection. The lowest concentration of SARS-CoV-2 viral copies this assay can detect is  250 copies / mL. A negative result does not preclude SARS-CoV-2 infection and should not be used as the sole basis for treatment or other patient management decisions.  A negative result may occur with improper specimen collection / handling, submission of specimen other than nasopharyngeal swab, presence of viral mutation(s) within the areas targeted by this assay, and inadequate number of viral copies (<250 copies / mL). A negative result must be combined with clinical observations, patient history, and epidemiological information.  Fact Sheet for Patients:   RoadLapTop.co.za  Fact Sheet for Healthcare Providers: http://kim-miller.com/  This test is not yet approved or                           cleared by the [place] FDA and has been authorized for detection and/or diagnosis of SARS-CoV-2 by FDA under an Emergency Use Authorization (EUA).  This EUA will remain in effect (meaning this test can be used) for the duration of the COVID-19 declaration under Section 564(b)(1) of the Act, 21 U.S.C. section 360bbb-3(b)(1), unless the authorization is terminated or revoked sooner.  Performed at [facility], [address], [place], [place] [address]    Opiates 04/16/2023 NONE DETECTED  NONE DETECTED Final   Cocaine 04/16/2023 POSITIVE (A)  NONE DETECTED Final   Benzodiazepines 04/16/2023 NONE DETECTED  NONE DETECTED Final   Amphetamines 04/16/2023 NONE DETECTED  NONE DETECTED Final   Tetrahydrocannabinol 04/16/2023 POSITIVE (A)  NONE DETECTED Final   Barbiturates 04/16/2023 NONE DETECTED  NONE DETECTED Final   Comment: (NOTE) DRUG SCREEN FOR MEDICAL PURPOSES ONLY.  IF CONFIRMATION IS NEEDED FOR ANY PURPOSE, NOTIFY LAB WITHIN 5 DAYS.  LOWEST DETECTABLE LIMITS FOR URINE DRUG SCREEN Drug Class                     Cutoff (ng/mL) Amphetamine and metabolites    1000 Barbiturate and metabolites    200 Benzodiazepine                  200 Opiates and metabolites        300 Cocaine and metabolites  300 THC                            50 Performed at [facility], [address] [place], [place] [address]     Blood Alcohol level:  Lab Results  Component Value Date   ETH 53 (H) 07/23/2023   ETH <10 04/15/2023    Metabolic Disorder Labs: Lab Results  Component Value Date   HGBA1C 5.7 (H) 12/04/2021   MPG 116.89 12/04/2021   MPG 108.28 05/09/2020   No results found for: "PROLACTIN" Lab Results  Component Value Date   CHOL 225 (H) 12/04/2021   TRIG 72 05/08/2022   HDL 54 12/04/2021   CHOLHDL 4.2 12/04/2021   VLDL 13 12/04/2021   LDLCALC 158 (H) 12/04/2021   LDLCALC 147 (H) 05/09/2020    Therapeutic Lab Levels: No results found for: "LITHIUM" No results found for: "VALPROATE" No results found for: "CBMZ"  Physical Findings   AUDIT    Flowsheet Row [facility]  Alcohol Use Disorder Identification Test Final Score (AUDIT) 17 38      PHQ2-9    Flowsheet Row ED from 07/22/2023 in [facility]  PHQ-2 Total Score 2  PHQ-9 Total Score 6      Flowsheet Row ED from 07/22/2023 in [facility] ED from 04/15/2023 in [facility] Department at [facility] ED to Hosp-Admission (Discharged) from 06/24/2022 in [facility]  C-SSRS RISK CATEGORY Moderate Risk Moderate Risk No Risk        Musculoskeletal  Strength & Muscle Tone: within normal limits Gait & Station: normal Patient leans: N/A  Psychiatric Specialty Exam  Presentation  General Appearance:  Disheveled  Eye Contact: Fair  Speech: Clear and Coherent  Speech Volume: Normal  Handedness: Right   Mood and Affect  Mood: Anxious;  Dysphoric; Irritable  Affect: Congruent   Thought Process  Thought Processes: Coherent  Descriptions of Associations:Intact  Orientation:Full (Time, Place and Person)  Thought Content:Logical  Diagnosis of Schizophrenia or Schizoaffective disorder in past: No    Hallucinations:Hallucinations: Auditory  Ideas of Reference:None  Suicidal Thoughts:Suicidal Thoughts: Yes, Active SI Active Intent and/or Plan: Without Intent; Without Plan  Homicidal Thoughts:Homicidal Thoughts: Yes, Passive HI Active Intent and/or Plan: Without Intent; Without Plan   Sensorium  Memory: Immediate Poor; Recent Poor; Remote Poor  Judgment: Impaired  Insight: Poor   Executive Functions  Concentration: Poor  Attention Span: Poor  Recall: Poor  Fund of Knowledge: Fair  Language: Fair   Psychomotor Activity  Psychomotor Activity: Psychomotor Activity: Normal   Assets  Assets: Resilience   Sleep  Sleep: Sleep: Fair Number of Hours of Sleep: 0 ("I can't count while sleeping")   Nutritional Assessment (For OBS and FBC admissions only) Has the patient had a weight loss or gain of 10 pounds or more in the last 3 months?: No Has the patient had a decrease in food intake/or appetite?: No Does the patient have dental problems?: No Does the patient have eating habits or behaviors that may be indicators of an eating disorder including binging or inducing vomiting?: No Has the patient recently lost weight without trying?: 0 Has the patient been eating poorly because of a decreased appetite?: 0 Malnutrition Screening Tool Score: 0   Physical Exam  Physical Exam Vitals and nursing note reviewed.  Musculoskeletal:        General: Normal range of motion.  Skin:    General: Skin is warm.  Neurological:     General: No focal deficit present.     Mental Status: He is oriented to person, place, and time.    Review of Systems  Psychiatric/Behavioral:  Positive for  depression, hallucinations, substance abuse and suicidal ideas. Negative for memory loss. The patient is nervous/anxious and has insomnia.   All other systems reviewed and are negative.  Blood pressure 133/88, pulse 96, temperature 97.6 F (36.4 C), resp. rate 18, SpO2 99%. There is no height or weight on file to calculate BMI.  [facility] Plan Summary: [facility] -Start PRN Ativan 1 mg with CIWAs>10 for alcohol use d/o (BAL was 53 on admission)-patient is unspecific about how much alcohol he drinks on a daily basis.  -Home meds restarted: -Abuterol PRN for wheezing/SOB -Prazosin 2 mg nightly for nightmares -Seroquel 300 mg nightly for mood stabilization -Trazodone 50 mg nightly for sleep -Lipitor 40 mg daily for hyperlipidemia -Norvasc 10 mg daily for hypertension  Labs Ordered: -Repeat BMP -Ha1c -Lipid panel -Vit B12 -Vit D  Starleen Blue, NP 07/24/2023 3:08 PM

## 2023-07-24 NOTE — ED Notes (Addendum)
 Patient was sleeping at change of shift,breathing easy. At current he is awake and irritable and loud argumentative. No aggression. He denies pain,si/hi/avh. Meal offered and he complained and stated "I am a grown Man and do t eat cereal, I want Hot food?.".  After sometime of explaining [facility] breakfast menu he then asked for [facility] and then sat down in calm manner. His focus is on going to [place]. Will continue to monitor and report changes as noted. Safety maintained.

## 2023-07-24 NOTE — ED Notes (Signed)
 Patient is resting with eyes closed without any distress noted. Staff will continue to monitor for safety per protocol and for changes in condition.

## 2023-07-24 NOTE — ED Notes (Signed)
 Patient currently asleep. Even rise and fall of chest with breathing even and unlabored. No s/s of current distress.

## 2023-07-25 NOTE — ED Notes (Signed)
 Pt sleeping@this  time breathing even and unlabored will continue to monitor for safety

## 2023-11-20 ENCOUNTER — Ambulatory Visit: Attending: Cardiology | Admitting: Cardiology

## 2023-11-20 NOTE — Progress Notes (Deleted)
  Cardiology Office Note:  .   Date:  11/20/2023  ID:  Christopher VEAR Core, DOB [birth year], MRN [id number] PCP: Freddrick Johns   HeartCare Providers Cardiologist:  Newman Lawrence, MD PCP: Pcp, No  No chief complaint on file.    LOREN SAWAYA is a 63 y.o. male with hypertension, hyperlipidemia. TAA, h/o infrarenal AAA repair, h/o cocaine  abuse, family history of congestive heart failure   Discussed the use of AI scribe software for clinical note transcription with the patient, who gave verbal consent to proceed.  History of Present Illness  I last saw the patient in 2023. Since then, he underwent AAA repair. ***     There were no vitals filed for this visit.    ROS      Studies Reviewed: .        *** Labs ***/202***: Chol ***, TG ***, HDL ***, LDL *** HbA1C ***% Hb *** Cr *** ***  CT C/A/P 06/2022: 1. No aortic dissection.  No evidence of acute aortic syndrome. 2. 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 Recommend annual imaging followup by CTA or MRA. This recommendation follows 2010 ACCF/AHA/AATS/ACR/ASA/SCA/SCAI/SIR/STS/SVM Guidelines for the Diagnosis and Management of Patients with Thoracic Aortic Disease. Circulation. 2010; 121: Z733-z630. Aortic aneurysm NOS (ICD10-I71.9) 3. Previous repair of an infrarenal abdominal aortic aneurysm. Appear stable from the prior CT.   NON CTA FINDINGS   1. Small stone projects near the ampulla Vater possibly within the distal common bile duct or pancreatic duct, unchanged when compared to the prior CT. Stability since the prior CT argues against a common bile duct stone. Pancreatic duct dilation and peripancreatic fluid attenuation is similar to the prior CT, but again is suspicious for pancreatitis if there are consistent clinical findings. Current arterial exam is suboptimal for assessment of the pancreas. 2. No other evidence of an acute abnormality within the abdomen or pelvis. No evidence of an acute  abnormality in the chest.  Risk Assessment/Calculations:   {Does this patient have ATRIAL FIBRILLATION?[phone]}    Physical Exam   VISIT DIAGNOSES: No diagnosis found.   ISIAAH CUERVO is a 63 y.o. male with hypertension, hyperlipidemia. 6.1 cm infrarenal AAA, h/o cocaine  abuse, family history of congestive heart failure  Assessment & Plan       {Are you ordering a CV Procedure (e.g. stress test, cath, DCCV, TEE, etc)?   Press F2        :[id number]}    No orders of the defined types were placed in this encounter.    F/u in ***  Signed, Newman JINNY Lawrence, MD

## 2023-12-20 ENCOUNTER — Encounter: Payer: Self-pay | Admitting: Cardiology

## 2023-12-20 ENCOUNTER — Ambulatory Visit: Payer: MEDICAID | Attending: Cardiology | Admitting: Cardiology

## 2023-12-20 ENCOUNTER — Other Ambulatory Visit (HOSPITAL_COMMUNITY): Payer: Self-pay

## 2023-12-20 VITALS — BP 126/72 | HR 95 | Ht 70.5 in | Wt 230.0 lb

## 2023-12-20 DIAGNOSIS — E782 Mixed hyperlipidemia: Secondary | ICD-10-CM | POA: Insufficient documentation

## 2023-12-20 DIAGNOSIS — R0609 Other forms of dyspnea: Secondary | ICD-10-CM | POA: Diagnosis present

## 2023-12-20 DIAGNOSIS — R072 Precordial pain: Secondary | ICD-10-CM | POA: Diagnosis present

## 2023-12-20 DIAGNOSIS — I10 Essential (primary) hypertension: Secondary | ICD-10-CM | POA: Diagnosis present

## 2023-12-20 DIAGNOSIS — I7121 Aneurysm of the ascending aorta, without rupture: Secondary | ICD-10-CM | POA: Diagnosis present

## 2023-12-20 MED ORDER — METOPROLOL TARTRATE 100 MG PO TABS
100.0000 mg | ORAL_TABLET | Freq: Once | ORAL | 0 refills | Status: AC
  Filled 2023-12-20: qty 1, 1d supply, fill #0

## 2023-12-20 NOTE — Patient Instructions (Addendum)
 Medication Instructions:  ON DAY OF CORONARY CTA TAKE: Metoprolol  tartrate Take 1 tablet (100 mg total) by mouth once for 1 dose. Take 90-120 minutes prior to scan. Hold for SBP less than 110.   *If you need a refill on your cardiac medications before your next appointment, please call your pharmacy*  Lab Work: LIPID PANEL  BMP PROBNP  If you have labs (blood work) drawn today and your tests are completely normal, you will receive your results only by: MyChart Message (if you have MyChart) OR A paper copy in the mail If you have any lab test that is abnormal or we need to change your treatment, we will call you to review the results.  Testing/Procedures: Echo  Your physician has requested that you have an echocardiogram. Echocardiography is a painless test that uses sound waves to create images of your heart. It provides your doctor with information about the size and shape of your heart and how well your heart's chambers and valves are working. This procedure takes approximately one hour. There are no restrictions for this procedure. Please do NOT wear cologne, perfume, aftershave, or lotions (deodorant is allowed). Please arrive 15 minutes prior to your appointment time.  Please note: We ask at that you not bring children with you during ultrasound (echo/ vascular) testing. Due to room size and safety concerns, children are not allowed in the ultrasound rooms during exams. Our front office staff cannot provide observation of children in our lobby area while testing is being conducted. An adult accompanying a patient to their appointment will only be allowed in the ultrasound room at the discretion of the ultrasound technician under special circumstances. We apologize for any inconvenience.  Coronary CTA   Your physician has requested that you have cardiac CT. Cardiac computed tomography (CT) is a painless test that uses an x-ray machine to take clear, detailed pictures of your heart. For  further information please visit [email or web address] Please follow instruction sheet as given.    Follow-Up: At [facility], you and your health needs are our priority.  As part of our continuing mission to provide you with exceptional heart care, our providers are all part of one team.  This team includes your primary Cardiologist (physician) and Advanced Practice Providers or APPs (Physician Assistants and Nurse Practitioners) who all work together to provide you with the care you need, when you need it.  Your next appointment:   1 year(s)  Provider:   Newman JINNY Lawrence, MD       Your cardiac CT will be scheduled at one of the below locations:   [facility] [address] [place], [place] [address] [phone]  OR   [facility] [address] [place], [place] [address] [phone]  OR   [facility] [address]  [place], [place] [address]   If scheduled at [facility], please arrive at the [facility] and Children's Entrance (Entrance C2) of [facility] 30 minutes prior to test start time. You can use the FREE valet parking offered at entrance C (encouraged to control the heart rate for the test)  Proceed to the [facility] Radiology Department (first floor) to check-in and test prep.  All radiology patients and guests should use entrance C2 at [facility], accessed from [facility], even though the hospital's physical address listed is [address]  If scheduled at the Heart and Vascular Tower at  [address], please enter the parking lot using the [address] entrance and use the FREE valet service at the patient drop-off area. Enter the building and check-in with registration on the main floor.  If scheduled at [facility], please arrive 30 minutes early for check-in and test prep.  Please follow these instructions carefully (unless  otherwise directed):  An IV will be required for this test and Nitroglycerin will be given.  Hold all erectile dysfunction medications at least 3 days (72 hrs) prior to test. (Ie viagra, cialis, sildenafil, tadalafil, etc)   On the Night Before the Test: Be sure to Drink plenty of water. Do not consume any caffeinated/decaffeinated beverages or chocolate 12 hours prior to your test. Do not take any antihistamines 12 hours prior to your test. If the patient has contrast allergy:  On the Day of the Test: Drink plenty of water until 1 hour prior to the test. Do not eat any food 1 hour prior to test. You may take your regular medications prior to the test.  Take metoprolol  (Lopressor ) 100 mg two hours prior to test. If you take Furosemide/Hydrochlorothiazide/Spironolactone/Chlorthalidone, please HOLD on the morning of the test. Patients who wear a continuous glucose monitor MUST remove the device prior to scanning.       After the Test: Drink plenty of water. After receiving IV contrast, you may experience a mild flushed feeling. This is normal. On occasion, you may experience a mild rash up to 24 hours after the test. This is not dangerous. If this occurs, you can take Benadryl 25 mg, Zyrtec, Claritin, or Allegra and increase your fluid intake. (Patients taking Tikosyn should avoid Benadryl, and may take Zyrtec, Claritin, or Allegra) If you experience trouble breathing, this can be serious. If it is severe call 911 IMMEDIATELY. If it is mild, please call our office.  We will call to schedule your test 2-4 weeks out understanding that some insurance companies will need an authorization prior to the service being performed.   For more information and frequently asked questions, please visit our website : [email or web address]  For non-scheduling related questions, please contact the cardiac imaging nurse navigator should you have any questions/concerns: Cardiac Imaging Nurse  Navigators Direct Office Dial: [phone]   For scheduling needs, including cancellations and rescheduling, please call [place], [phone].

## 2023-12-20 NOTE — Progress Notes (Addendum)
 Cardiology Office Note:  .   Date:  12/20/2023  ID:  Angel Golden, DOB 02-01-1961, MRN [id number] PCP: Freddrick, No  [facility] Providers Cardiologist:  Newman Lawrence, MD PCP: Pcp, No  Chief Complaint  Patient presents with   Hypertension   Hyperlipidemia     Angel Golden is a 63 y.o. male with hypertension, hyperlipidemia. TAA, h/o infrarenal AAA repair, h/o cocaine  abuse, family history of congestive heart failure   History of Present Illness  I last saw the patient in 2023. Since then, he underwent AAA repair.  He has a sedentary lifestyle.  He has recently had some exertional dyspnea symptoms.      Vitals:   12/20/23 1320  BP: 126/72  Pulse: 95  SpO2: 96%      Review of Systems  Cardiovascular:  Positive for chest pain and dyspnea on exertion. Negative for leg swelling, palpitations and syncope.        Studies Reviewed: SABRA        EKG 12/20/2023: Sinus rhythm with Premature ventricular complexes Right bundle branch block When compared with ECG of 15-Apr-2023 16:35, Premature ventricular complexes new T wave inversion no longer evident in Anterior leads T wave amplitude has decreased in Lateral leads  Labs 07/2023: Hb 14.5 Cr 0.86, Na 134 TSH 1.2  11/2021: Chol 225, TG 67, HDL 54, LDL 158 HbA1C 5.7%  CT C/A/P 06/2022: 1. No aortic dissection.  No evidence of acute aortic syndrome. 2. 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 Recommend annual imaging followup by CTA or MRA. This recommendation follows 2010 ACCF/AHA/AATS/ACR/ASA/SCA/SCAI/SIR/STS/SVM Guidelines for the Diagnosis and Management of Patients with Thoracic Aortic Disease. Circulation. 2010; 121: Z733-z630. Aortic aneurysm NOS (ICD10-I71.9) 3. Previous repair of an infrarenal abdominal aortic aneurysm. Appear stable from the prior CT.   NON CTA FINDINGS   1. Small stone projects near the ampulla Vater possibly within the distal common bile duct or pancreatic duct,  unchanged when compared to the prior CT. Stability since the prior CT argues against a common bile duct stone. Pancreatic duct dilation and peripancreatic fluid attenuation is similar to the prior CT, but again is suspicious for pancreatitis if there are consistent clinical findings. Current arterial exam is suboptimal for assessment of the pancreas. 2. No other evidence of an acute abnormality within the abdomen or pelvis. No evidence of an acute abnormality in the chest.  Echocardiogram 2023: Normal LV systolic function with EF 68%. Mild basal asymmetric hypertrophy of the left ventricle. Left ventricle cavity is normal in size. Normal global Dinius motion. Normal diastolic filling pattern. Calculated EF 68%. Trileaflet aortic valve.  Mild (Grade I) aortic regurgitation.   Physical Exam Vitals and nursing note reviewed.  Constitutional:      General: He is not in acute distress. Neck:     Vascular: No JVD.  Cardiovascular:     Rate and Rhythm: Normal rate and regular rhythm.     Heart sounds: Normal heart sounds. No murmur heard. Pulmonary:     Effort: Pulmonary effort is normal.     Breath sounds: Normal breath sounds. No wheezing or rales.  Musculoskeletal:     Right lower leg: No edema.     Left lower leg: No edema.      VISIT DIAGNOSES:   ICD-10-CM   1. Primary hypertension  I10 EKG 12-Lead    2. Hyperlipidemia, unspecified hyperlipidemia type  E78.5 EKG 12-Lead    3. Mixed hyperlipidemia  E78.2 Lipid panel  4. Exertional dyspnea  R06.09 ECHOCARDIOGRAM COMPLETE    Pro b natriuretic peptide (BNP)    Basic metabolic panel with GFR    5. Precordial pain  R07.2 Basic metabolic panel with GFR    CT CORONARY MORPH W/CTA COR W/SCORE W/CA W/CM &/OR WO/CM    metoprolol  tartrate (LOPRESSOR ) 100 MG tablet       Angel Golden is a 63 y.o. male with hypertension, hyperlipidemia. 6.1 cm infrarenal AAA, h/o cocaine  abuse, family history of congestive heart failure   Assessment & Plan  Exertional dyspnea: No significant heart failure signs on exam.  Check echocardiogram, proBNP.  Precordial pain: Atypical chest pain.  Check coronary CT angiogram.  Will also get an idea about aorta dimension on the CT scan, which will compare with echocardiogram.  TAA: Recommendations as above. Heart rate and blood pressure very well-controlled.  In future, could consider adding beta-blocker and/or ARB. Based on echocardiogram and CT findings, may consider genetic counseling.  H/o AAA repair: Follow-up with vascular surgery.  Mixed hyperlipidemia: Continue Lipitor 40 mg daily.  Check lipid panel.  Hypertension: Well-controlled on 10 mg daily.        Meds ordered this encounter  Medications   metoprolol  tartrate (LOPRESSOR ) 100 MG tablet    Sig: Take 1 tablet (100 mg total) by mouth once for 1 dose. Take 90-120 minutes prior to scan. Hold for SBP less than 110.    Dispense:  1 tablet    Refill:  0     F/u in 1 year  Signed, Newman JINNY Lawrence, MD

## 2023-12-21 LAB — BASIC METABOLIC PANEL WITH GFR
BUN/Creatinine Ratio: 9 — ABNORMAL LOW (ref 10–24)
BUN: 11 mg/dL (ref 8–27)
CO2: 20 mmol/L (ref 20–29)
Calcium: 9.9 mg/dL (ref 8.6–10.2)
Chloride: 103 mmol/L (ref 96–106)
Creatinine, Ser: 1.16 mg/dL (ref 0.76–1.27)
Glucose: 81 mg/dL (ref 70–99)
Potassium: 4.3 mmol/L (ref 3.5–5.2)
Sodium: 138 mmol/L (ref 134–144)
eGFR: 71 mL/min/1.73 (ref >59)

## 2023-12-21 LAB — LIPID PANEL
Chol/HDL Ratio: 4.4 ratio (ref 0.0–5.0)
Cholesterol, Total: 166 mg/dL (ref 100–199)
HDL: 38 mg/dL — ABNORMAL LOW (ref >39)
LDL Chol Calc (NIH): 106 mg/dL — ABNORMAL HIGH (ref 0–99)
Triglycerides: 122 mg/dL (ref 0–149)
VLDL Cholesterol Cal: 22 mg/dL (ref 5–40)

## 2023-12-21 LAB — PRO B NATRIURETIC PEPTIDE: NT-Pro BNP: <36 pg/mL (ref 0–210)

## 2023-12-23 ENCOUNTER — Encounter (HOSPITAL_COMMUNITY): Payer: Self-pay

## 2023-12-27 ENCOUNTER — Ambulatory Visit (HOSPITAL_COMMUNITY)
Admission: RE | Admit: 2023-12-27 | Discharge: 2023-12-27 | Disposition: A | Discharge status: Home / Self Care | Payer: MEDICAID | Source: Ambulatory Visit | Attending: Cardiology | Admitting: Cardiology

## 2023-12-27 DIAGNOSIS — I2510 Atherosclerotic heart disease of native coronary artery without angina pectoris: Secondary | ICD-10-CM | POA: Diagnosis not present

## 2023-12-27 DIAGNOSIS — I2584 Coronary atherosclerosis due to calcified coronary lesion: Secondary | ICD-10-CM | POA: Diagnosis not present

## 2023-12-27 DIAGNOSIS — R072 Precordial pain: Secondary | ICD-10-CM | POA: Insufficient documentation

## 2023-12-27 MED ORDER — IOHEXOL 350 MG/ML SOLN
100.0000 mL | Freq: Once | INTRAVENOUS | Status: AC | PRN
  Administered 2023-12-27: 100 mL via INTRAVENOUS

## 2023-12-27 MED ORDER — NITROGLYCERIN 0.4 MG SL SUBL
0.8000 mg | SUBLINGUAL_TABLET | Freq: Once | SUBLINGUAL | Status: AC
  Administered 2023-12-27: 0.8 mg via SUBLINGUAL

## 2023-12-29 ENCOUNTER — Other Ambulatory Visit: Payer: Self-pay | Admitting: Cardiology

## 2023-12-29 ENCOUNTER — Ambulatory Visit (HOSPITAL_BASED_OUTPATIENT_CLINIC_OR_DEPARTMENT_OTHER)
Admission: RE | Admit: 2023-12-29 | Discharge: 2023-12-29 | Disposition: A | Discharge status: Home / Self Care | Payer: MEDICAID | Source: Ambulatory Visit | Attending: Cardiology | Admitting: Cardiology

## 2023-12-29 DIAGNOSIS — I2510 Atherosclerotic heart disease of native coronary artery without angina pectoris: Secondary | ICD-10-CM | POA: Diagnosis not present

## 2023-12-29 DIAGNOSIS — R931 Abnormal findings on diagnostic imaging of heart and coronary circulation: Secondary | ICD-10-CM

## 2023-12-30 ENCOUNTER — Ambulatory Visit: Payer: Self-pay | Admitting: Cardiology

## 2023-12-30 MED ORDER — ASPIRIN 81 MG PO TBEC: 81.0000 mg | DELAYED_RELEASE_TABLET | Freq: Every day | ORAL | Status: AC

## 2023-12-30 NOTE — Progress Notes (Signed)
 Moderate nonobstructive coronary artery disease.  Ascending aorta upper limit normal at 3.9 cm, no further management needed for this.  Chest pain unlikely to be related to any severe blockages, but needs aggressive medical management to reduce progression of coronary artery disease.  Recommend adding aspirin  81 mg daily.  Please ensure that patient is taking Lipitor 40 mg daily.  If he is not, recommend the same.  Thanks MJP

## 2023-12-30 NOTE — Telephone Encounter (Signed)
 Contacted patient and confirmed that pt has been taking Lipitor 40 mg daily. Pt reports that he will add in taking aspirin  81 mg. Pt declined needing a prescription.

## 2024-01-28 ENCOUNTER — Ambulatory Visit (HOSPITAL_COMMUNITY)
Admission: RE | Admit: 2024-01-28 | Discharge: 2024-01-28 | Disposition: A | Discharge status: Home / Self Care | Payer: MEDICAID | Source: Ambulatory Visit | Attending: Cardiovascular Disease | Admitting: Cardiovascular Disease

## 2024-01-28 DIAGNOSIS — R0609 Other forms of dyspnea: Secondary | ICD-10-CM | POA: Diagnosis present

## 2024-01-28 LAB — ECHOCARDIOGRAM COMPLETE
Area-P 1/2: 3.94 cm2
S' Lateral: 3.1 cm

## 2024-06-12 ENCOUNTER — Other Ambulatory Visit: Payer: Self-pay | Admitting: Vascular Surgery

## 2024-06-12 DIAGNOSIS — I7143 Infrarenal abdominal aortic aneurysm, without rupture: Secondary | ICD-10-CM
